# Patient Record
Sex: Female | Born: 1960 | Race: White | Hispanic: No | Marital: Married | Smoking: Never smoker
Health system: Southern US, Community
[De-identification: ages and names within clinical notes are randomized; demographics above are authoritative.]

## PROBLEM LIST (undated history)

## (undated) DIAGNOSIS — J40 Bronchitis, not specified as acute or chronic: Secondary | ICD-10-CM

## (undated) DIAGNOSIS — I10 Essential (primary) hypertension: Secondary | ICD-10-CM

## (undated) DIAGNOSIS — J45909 Unspecified asthma, uncomplicated: Secondary | ICD-10-CM

## (undated) DIAGNOSIS — F419 Anxiety disorder, unspecified: Secondary | ICD-10-CM

## (undated) DIAGNOSIS — J189 Pneumonia, unspecified organism: Secondary | ICD-10-CM

## (undated) DIAGNOSIS — E785 Hyperlipidemia, unspecified: Secondary | ICD-10-CM

## (undated) HISTORY — PX: BREAST EXCISIONAL BIOPSY: SUR124

## (undated) HISTORY — DX: Bronchitis, not specified as acute or chronic: J40

## (undated) HISTORY — PX: HYSTEROTOMY: SHX1776

## (undated) HISTORY — DX: Unspecified asthma, uncomplicated: J45.909

## (undated) HISTORY — PX: BREAST BIOPSY: SHX20

## (undated) HISTORY — PX: ABDOMINAL HYSTERECTOMY: SHX81

## (undated) HISTORY — PX: BREAST SURGERY: SHX581

---

## 2006-11-09 ENCOUNTER — Encounter: Admission: RE | Admit: 2006-11-09 | Discharge: 2006-11-09 | Payer: Self-pay | Admitting: Internal Medicine

## 2006-11-14 ENCOUNTER — Encounter: Admission: RE | Admit: 2006-11-14 | Discharge: 2006-11-14 | Payer: Self-pay | Admitting: Internal Medicine

## 2006-11-21 ENCOUNTER — Encounter: Admission: RE | Admit: 2006-11-21 | Discharge: 2006-11-21 | Payer: Self-pay | Admitting: Internal Medicine

## 2006-11-22 ENCOUNTER — Encounter: Admission: RE | Admit: 2006-11-22 | Discharge: 2006-11-22 | Payer: Self-pay | Admitting: Internal Medicine

## 2008-10-30 HISTORY — PX: ABDOMINAL HYSTERECTOMY: SHX81

## 2009-09-02 ENCOUNTER — Encounter: Admission: RE | Admit: 2009-09-02 | Discharge: 2009-09-02 | Payer: Self-pay | Admitting: Obstetrics & Gynecology

## 2009-11-23 ENCOUNTER — Ambulatory Visit (HOSPITAL_COMMUNITY): Admission: RE | Admit: 2009-11-23 | Discharge: 2009-11-24 | Payer: Self-pay | Admitting: Obstetrics & Gynecology

## 2009-11-23 ENCOUNTER — Encounter (INDEPENDENT_AMBULATORY_CARE_PROVIDER_SITE_OTHER): Payer: Self-pay | Admitting: Obstetrics & Gynecology

## 2010-11-20 ENCOUNTER — Encounter: Payer: Self-pay | Admitting: Obstetrics & Gynecology

## 2011-01-16 LAB — DIFFERENTIAL
Basophils Absolute: 0.1 10*3/uL (ref 0.0–0.1)
Eosinophils Absolute: 0.2 10*3/uL (ref 0.0–0.7)
Eosinophils Relative: 2 % (ref 0–5)
Monocytes Absolute: 0.5 10*3/uL (ref 0.1–1.0)
Monocytes Relative: 6 % (ref 3–12)
Neutrophils Relative %: 61 % (ref 43–77)

## 2011-01-16 LAB — URINALYSIS, ROUTINE W REFLEX MICROSCOPIC
Bilirubin Urine: NEGATIVE
Hgb urine dipstick: NEGATIVE
Ketones, ur: NEGATIVE mg/dL
Nitrite: NEGATIVE

## 2011-01-16 LAB — COMPREHENSIVE METABOLIC PANEL
Albumin: 4.1 g/dL (ref 3.5–5.2)
Alkaline Phosphatase: 94 U/L (ref 39–117)
BUN: 10 mg/dL (ref 6–23)
CO2: 29 mEq/L (ref 19–32)
Calcium: 9.2 mg/dL (ref 8.4–10.5)
Chloride: 102 mEq/L (ref 96–112)
Glucose, Bld: 99 mg/dL (ref 70–99)
Total Bilirubin: 0.9 mg/dL (ref 0.3–1.2)
Total Protein: 7.3 g/dL (ref 6.0–8.3)

## 2011-01-16 LAB — CBC
HCT: 34.2 % — ABNORMAL LOW (ref 36.0–46.0)
Hemoglobin: 14.4 g/dL (ref 12.0–15.0)
MCHC: 33.7 g/dL (ref 30.0–36.0)
MCV: 90.3 fL (ref 78.0–100.0)
Platelets: 218 10*3/uL (ref 150–400)
RBC: 3.77 MIL/uL — ABNORMAL LOW (ref 3.87–5.11)
RDW: 14.6 % (ref 11.5–15.5)

## 2011-01-24 ENCOUNTER — Other Ambulatory Visit: Payer: Self-pay | Admitting: Obstetrics & Gynecology

## 2011-01-24 DIAGNOSIS — Z0000 Encounter for general adult medical examination without abnormal findings: Secondary | ICD-10-CM

## 2011-01-24 DIAGNOSIS — Z1231 Encounter for screening mammogram for malignant neoplasm of breast: Secondary | ICD-10-CM

## 2011-01-25 ENCOUNTER — Ambulatory Visit: Payer: Self-pay

## 2011-01-25 ENCOUNTER — Other Ambulatory Visit: Payer: Self-pay | Admitting: Obstetrics & Gynecology

## 2011-01-25 ENCOUNTER — Ambulatory Visit
Admission: RE | Admit: 2011-01-25 | Discharge: 2011-01-25 | Disposition: A | Discharge status: Home / Self Care | Payer: BC Managed Care – PPO | Source: Ambulatory Visit | Attending: Obstetrics & Gynecology | Admitting: Obstetrics & Gynecology

## 2011-01-25 DIAGNOSIS — Z1239 Encounter for other screening for malignant neoplasm of breast: Secondary | ICD-10-CM

## 2012-03-20 ENCOUNTER — Other Ambulatory Visit: Payer: Self-pay | Admitting: Obstetrics & Gynecology

## 2012-03-20 DIAGNOSIS — Z1231 Encounter for screening mammogram for malignant neoplasm of breast: Secondary | ICD-10-CM

## 2012-04-10 ENCOUNTER — Ambulatory Visit: Payer: BC Managed Care – PPO

## 2012-04-29 ENCOUNTER — Ambulatory Visit
Admission: RE | Admit: 2012-04-29 | Discharge: 2012-04-29 | Disposition: A | Discharge status: Home / Self Care | Payer: BC Managed Care – PPO | Source: Ambulatory Visit | Attending: Obstetrics & Gynecology | Admitting: Obstetrics & Gynecology

## 2012-04-29 DIAGNOSIS — Z1231 Encounter for screening mammogram for malignant neoplasm of breast: Secondary | ICD-10-CM

## 2015-06-23 DIAGNOSIS — M5432 Sciatica, left side: Secondary | ICD-10-CM | POA: Insufficient documentation

## 2015-06-23 DIAGNOSIS — H109 Unspecified conjunctivitis: Secondary | ICD-10-CM | POA: Insufficient documentation

## 2015-09-06 ENCOUNTER — Other Ambulatory Visit: Payer: Self-pay

## 2015-09-06 DIAGNOSIS — Z1231 Encounter for screening mammogram for malignant neoplasm of breast: Secondary | ICD-10-CM

## 2015-10-07 ENCOUNTER — Ambulatory Visit: Payer: Self-pay

## 2015-10-07 ENCOUNTER — Other Ambulatory Visit: Payer: Self-pay

## 2015-10-29 DIAGNOSIS — R519 Headache, unspecified: Secondary | ICD-10-CM | POA: Insufficient documentation

## 2017-10-22 ENCOUNTER — Telehealth: Payer: Self-pay | Admitting: Pulmonary Disease

## 2017-10-22 NOTE — Telephone Encounter (Signed)
I cannot determine how soon she should be seen as there is no other information on the chart or imaging regarding her history and the lung nodules. Can you check if anyone else can see her earlier in clinic?

## 2017-10-22 NOTE — Telephone Encounter (Signed)
Pt has been scheduled for consult with PM on 11/08/16, as this is first available. Pt is requesting a sooner apt. Pt has been made aware that 11/08/16 is first available.  PM please advise if pt should be seen sooner, or if current apt is okay. Thanks.

## 2017-10-24 NOTE — Telephone Encounter (Signed)
Called pt letting her know that the appt she has already scheduled with us is the earliest we could get her in for an appt due to her being a new consult and requiring a 30min slot. Pt expressed understanding. Nothing further needed.

## 2017-11-08 ENCOUNTER — Encounter: Payer: Self-pay | Admitting: Pulmonary Disease

## 2017-11-08 ENCOUNTER — Ambulatory Visit: Payer: BLUE CROSS/BLUE SHIELD | Admitting: Pulmonary Disease

## 2017-11-08 VITALS — BP 132/78 | HR 93 | Ht 65.0 in | Wt 227.6 lb

## 2017-11-08 DIAGNOSIS — R918 Other nonspecific abnormal finding of lung field: Secondary | ICD-10-CM

## 2017-11-08 DIAGNOSIS — J45909 Unspecified asthma, uncomplicated: Secondary | ICD-10-CM

## 2017-11-08 LAB — NITRIC OXIDE: Nitric Oxide: 27

## 2017-11-08 MED ORDER — ALBUTEROL SULFATE (2.5 MG/3ML) 0.083% IN NEBU: 2.5000 mg | INHALATION_SOLUTION | Freq: Four times a day (QID) | RESPIRATORY_TRACT | 12 refills | Status: DC | PRN

## 2017-11-08 MED ORDER — ALBUTEROL SULFATE HFA 108 (90 BASE) MCG/ACT IN AERS: 2.0000 | INHALATION_SPRAY | Freq: Four times a day (QID) | RESPIRATORY_TRACT | 3 refills | Status: DC | PRN

## 2017-11-08 NOTE — Patient Instructions (Addendum)
I have reviewed your CT scan report.  You have 2 small nodules which look benign and not likely to be a malignancy We will follow-up with a repeat CT without contrast in 1 year It would be useful if you can give us the previous CT images on a disc for us to review. Follow-up in 1 year after repeat scan We will call in a prescription for albuterol.

## 2017-11-08 NOTE — Progress Notes (Signed)
Mary Mendez    [id number]    08/03/1961  Primary Care Physician:Millsaps, Cala BradfordKimberly, NP  Referring Physician: Lindaann PascalLong, Scott, PA-C [address] [place], [place] [id number]  Chief complaint:  Consult for evaluation of lung nodules  HPI:  57 year old with history of mild intermittent asthma.  She had incidental findings of lung nodules on a CT chest done to evaluate swelling over the clavicle.  She has been referred here for further evaluation She has complaints of cough, sinus congestion from viral infection.  Denies any fevers, chills, sputum production, hemoptysis, loss of weight, loss of appetite.  She has history of mild intermittent asthma and is on albuterol respiclick.  She hardly needs to use the rescue inhaler.  She feels that the powder formulation does not work as well and is requesting switching to a regular inhaler.  Pets: None Occupation: Self-employed as a Programme researcher, broadcasting/film/videohypnotist Exposures: No known exposures Smoking history: Smoker, no secondhand smoke Travel History: Grew up in [place].  Moved to [place] in 1994.  Outpatient Encounter Medications as of 11/08/2017  Medication Sig  . PROAIR RESPICLICK 108 (90 Base) MCG/ACT AEPB INHALE 2 PUFFS INTO THE LUNGS Q 4 H PRN FOR 10 DAYS  . tretinoin (RETIN-A) 0.05 % cream APPLY ON THE SKIN NIGHTLY   No facility-administered encounter medications on file as of 11/08/2017.     Allergies as of 11/08/2017  . (No Known Allergies)    Past Medical History:  Diagnosis Date  . Asthma   . Bronchitis     Past Surgical History:  Procedure Laterality Date  . BREAST BIOPSY    . HYSTEROTOMY      Family History  Problem Relation Age of Onset  . Breast cancer Mother   . Emphysema Father   . Asthma Paternal Uncle     Social History   Socioeconomic History  . Marital status: Married    Spouse name: Not on file  . Number of children: Not on file  . Years of education: Not on file  . Highest education level: Not  on file  Social Needs  . Financial resource strain: Not on file  . Food insecurity - worry: Not on file  . Food insecurity - inability: Not on file  . Transportation needs - medical: Not on file  . Transportation needs - non-medical: Not on file  Occupational History  . Not on file  Tobacco Use  . Smoking status: Never Smoker  . Smokeless tobacco: Never Used  Substance and Sexual Activity  . Alcohol use: Yes    Comment: occ  . Drug use: No  . Sexual activity: Not on file  Other Topics Concern  . Not on file  Social History Narrative  . Not on file   Review of systems: Review of Systems  Constitutional: Negative for fever and chills.  HENT: Negative.   Eyes: Negative for blurred vision.  Respiratory: as per HPI  Cardiovascular: Negative for chest pain and palpitations.  Gastrointestinal: Negative for vomiting, diarrhea, blood per rectum. Genitourinary: Negative for dysuria, urgency, frequency and hematuria.  Musculoskeletal: Negative for myalgias, back pain and joint pain.  Skin: Negative for itching and rash.  Neurological: Negative for dizziness, tremors, focal weakness, seizures and loss of consciousness.  Endo/Heme/Allergies: Negative for environmental allergies.  Psychiatric/Behavioral: Negative for depression, suicidal ideas and hallucinations.  All other systems reviewed and are negative.  Physical Exam: Blood pressure 132/78, pulse 93, height 5\' 5"  (1.651 m), weight 227  lb 9.6 oz (103.2 kg), SpO2 98 %. Gen:      No acute distress HEENT:  EOMI, sclera anicteric Neck:     No masses; no thyromegaly Lungs:    Clear to auscultation bilaterally; normal respiratory effort CV:         Regular rate and rhythm; no murmurs Abd:      + bowel sounds; soft, non-tender; no palpable masses, no distension Ext:    No edema; adequate peripheral perfusion Skin:      Warm and dry; no rash Neuro: alert and oriented x 3 Psych: normal mood and affect  Data Reviewed: CT chest  10/17/14 2 mm left upper lobe granuloma, 2 mm right upper lobe granuloma.  No other abnormality.  FENO 11/08/17-27  Assessment:  Incidental finding of lung nodules I do not have the images to review but by report they look like granulomas, likely benign.  She is a non-smoker with no risk factors. This could be sequelae of prior history of histo infection as she grew up in [place]. Suspicion for malignancy is low.   We will try to get actual images of her CT to review.  I have reassured the patient and recommended follow-up CT in 1 year   Mild intermittent asthma She is currently on albuterol respite click powdered formulation and does not like it.  She is requesting a prescription for regular albuterol.  Plan/Recommendations: - CT chest without contrast in 1 year.  Obtain images of prior chest imaging - Prescribe albuterol inhaler.  Chilton Greathouse MD [facility] Pager [phone] 11/08/2017, 11:54 AM  CC: Lindaann Pascal, PA-C

## 2017-11-13 ENCOUNTER — Other Ambulatory Visit: Payer: Self-pay | Admitting: *Deleted

## 2017-11-13 ENCOUNTER — Other Ambulatory Visit: Payer: Self-pay | Admitting: Obstetrics & Gynecology

## 2017-11-13 DIAGNOSIS — Z139 Encounter for screening, unspecified: Secondary | ICD-10-CM

## 2017-12-04 ENCOUNTER — Ambulatory Visit: Payer: BLUE CROSS/BLUE SHIELD

## 2017-12-20 ENCOUNTER — Ambulatory Visit
Admission: RE | Admit: 2017-12-20 | Discharge: 2017-12-20 | Disposition: A | Discharge status: Home / Self Care | Payer: BLUE CROSS/BLUE SHIELD | Source: Ambulatory Visit | Attending: *Deleted | Admitting: *Deleted

## 2017-12-20 DIAGNOSIS — Z139 Encounter for screening, unspecified: Secondary | ICD-10-CM

## 2018-10-15 ENCOUNTER — Other Ambulatory Visit: Payer: Self-pay | Admitting: Pulmonary Disease

## 2018-10-15 ENCOUNTER — Ambulatory Visit
Admission: RE | Admit: 2018-10-15 | Discharge: 2018-10-15 | Disposition: A | Discharge status: Home / Self Care | Payer: Self-pay | Source: Ambulatory Visit | Attending: Pulmonary Disease | Admitting: Pulmonary Disease

## 2018-10-15 DIAGNOSIS — R918 Other nonspecific abnormal finding of lung field: Secondary | ICD-10-CM

## 2018-11-18 ENCOUNTER — Inpatient Hospital Stay: Admission: RE | Admit: 2018-11-18 | Payer: BLUE CROSS/BLUE SHIELD | Source: Ambulatory Visit

## 2018-11-21 ENCOUNTER — Ambulatory Visit: Payer: BLUE CROSS/BLUE SHIELD | Admitting: Pulmonary Disease

## 2018-12-17 ENCOUNTER — Inpatient Hospital Stay: Admission: RE | Admit: 2018-12-17 | Payer: BLUE CROSS/BLUE SHIELD | Source: Ambulatory Visit

## 2018-12-19 ENCOUNTER — Ambulatory Visit: Payer: BLUE CROSS/BLUE SHIELD | Admitting: Pulmonary Disease

## 2018-12-25 ENCOUNTER — Ambulatory Visit (INDEPENDENT_AMBULATORY_CARE_PROVIDER_SITE_OTHER)
Admission: RE | Admit: 2018-12-25 | Discharge: 2018-12-25 | Disposition: A | Discharge status: Home / Self Care | Payer: BLUE CROSS/BLUE SHIELD | Source: Ambulatory Visit | Attending: Pulmonary Disease | Admitting: Pulmonary Disease

## 2018-12-25 DIAGNOSIS — R918 Other nonspecific abnormal finding of lung field: Secondary | ICD-10-CM

## 2018-12-26 ENCOUNTER — Ambulatory Visit: Payer: BLUE CROSS/BLUE SHIELD | Admitting: Pulmonary Disease

## 2018-12-26 ENCOUNTER — Encounter: Payer: Self-pay | Admitting: Pulmonary Disease

## 2018-12-26 VITALS — BP 140/80 | HR 100 | Ht 65.0 in | Wt 231.2 lb

## 2018-12-26 DIAGNOSIS — R918 Other nonspecific abnormal finding of lung field: Secondary | ICD-10-CM | POA: Diagnosis not present

## 2018-12-26 DIAGNOSIS — J45909 Unspecified asthma, uncomplicated: Secondary | ICD-10-CM | POA: Diagnosis not present

## 2018-12-26 MED ORDER — ALBUTEROL SULFATE HFA 108 (90 BASE) MCG/ACT IN AERS: 2.0000 | INHALATION_SPRAY | Freq: Four times a day (QID) | RESPIRATORY_TRACT | 3 refills | Status: DC | PRN

## 2018-12-26 NOTE — Patient Instructions (Signed)
His CT scan shows that the left nodule is improved.  The right nodule is the same size which is good news This is likely benign lung nodules We will get a follow-up CT without contrast in 1 and half years time Return to clinic after CT scan.

## 2018-12-26 NOTE — Progress Notes (Signed)
         Mary Mendez    [id number]    12-24-1960  Primary Care Physician:Millsaps, Cala Bradford, NP  Referring Physician: Marva Panda, NP [address] [place], [place] [address]  Chief complaint:  Follow up for lung nodules, asthma  HPI:  58 year old with history of mild intermittent asthma.  She had incidental findings of lung nodules on a CT chest done to evaluate swelling over the clavicle.  She has been referred here for further evaluation She has complaints of cough, sinus congestion from viral infection.  Denies any fevers, chills, sputum production, hemoptysis, loss of weight, loss of appetite.  She has history of mild intermittent asthma and is on albuterol respiclick.  She hardly needs to use the rescue inhaler.  She feels that the powder formulation does not work as well and is requesting switching to a regular inhaler.  Pets: None Occupation: Self-employed as a Programme researcher, broadcasting/film/video Exposures: No known exposures Smoking history: Smoker, no secondhand smoke Travel History: Grew up in [place].  Moved to [place] in 1994.  Interim history: No complaints.  Dyspnea stable.  She hardly needs to use her albuterol inhaler She is here for review of CT scan.  Outpatient Encounter Medications as of 12/26/2018  Medication Sig  . albuterol (PROAIR HFA) 108 (90 Base) MCG/ACT inhaler Inhale 2 puffs into the lungs every 6 (six) hours as needed for wheezing or shortness of breath.  Marland Kitchen albuterol (PROVENTIL) (2.5 MG/3ML) 0.083% nebulizer solution Take 3 mLs (2.5 mg total) by nebulization every 6 (six) hours as needed for wheezing or shortness of breath.  . tretinoin (RETIN-A) 0.05 % cream APPLY ON THE SKIN NIGHTLY   No facility-administered encounter medications on file as of 12/26/2018.    Physical Exam: Blood pressure 140/80, pulse 100, height 5\' 5"  (1.651 m), weight 231 lb 3.2 oz (104.9 kg), SpO2 97 %. Gen:      No acute distress HEENT:  EOMI, sclera anicteric Neck:     No  masses; no thyromegaly Lungs:    Clear to auscultation bilaterally; normal respiratory effort CV:         Regular rate and rhythm; no murmurs Abd:      + bowel sounds; soft, non-tender; no palpable masses, no distension Ext:    No edema; adequate peripheral perfusion Skin:      Warm and dry; no rash Neuro: alert and oriented x 3 Psych: normal mood and affect  Data Reviewed: CT chest 10/17/17 2 mm left upper lobe granuloma,4 mm right upper lobe granuloma.  No other abnormality.  CT chest 12/25/2018 Stable 4 mm nodule in the right upper lobe.  Left upper lobe nodule is not obvious on this exam. I have reviewed the images personally.  FENO 11/08/17-27  Assessment:  Incidental finding of lung nodules Stable on follow-up CT and are probably benign.  She is a non-smoker with no risk factors. This could be sequelae of prior history of histo infection as she grew up in [place]. Suspicion for malignancy is low.   Follow-up CT in 1 to 1.5 years.  If stable then we can stop imaging.  Mild intermittent asthma Albuterol PRN.  Plan/Recommendations: - CT chest without contrast follow-up.  Chilton Greathouse MD [facility] 12/26/2018, 4:08 PM  CC: Marva Panda, NP

## 2019-08-07 DIAGNOSIS — R0781 Pleurodynia: Secondary | ICD-10-CM | POA: Insufficient documentation

## 2019-08-15 ENCOUNTER — Other Ambulatory Visit: Payer: Self-pay | Admitting: *Deleted

## 2019-08-15 DIAGNOSIS — Z1231 Encounter for screening mammogram for malignant neoplasm of breast: Secondary | ICD-10-CM

## 2019-10-03 ENCOUNTER — Ambulatory Visit: Payer: BLUE CROSS/BLUE SHIELD

## 2019-11-08 ENCOUNTER — Emergency Department (HOSPITAL_COMMUNITY)
Admission: EM | Admit: 2019-11-08 | Discharge: 2019-11-08 | Disposition: A | Discharge status: Home / Self Care | Payer: BC Managed Care – PPO | Attending: Emergency Medicine | Admitting: Emergency Medicine

## 2019-11-08 DIAGNOSIS — J45909 Unspecified asthma, uncomplicated: Secondary | ICD-10-CM | POA: Insufficient documentation

## 2019-11-08 DIAGNOSIS — R040 Epistaxis: Secondary | ICD-10-CM | POA: Insufficient documentation

## 2019-11-08 MED ORDER — OXYMETAZOLINE HCL 0.05 % NA SOLN
1.0000 | Freq: Once | NASAL | Status: AC
  Administered 2019-11-08: 1 via NASAL
  Filled 2019-11-08: qty 30

## 2019-11-08 NOTE — ED Triage Notes (Signed)
Transported by [facility] from [facility]-- started experiencing a nose bleed at 6 am this morning; hx of such. Went to doctor's office this am and they administered 2 doses of Afrin; bleeding controlled upon arrival but patient sent to ED for further evaluation due to elevated blood pressure. BP with EMS read 178/110--no prior history of HTN. Admits that she has been taking Goody powders daily for "months."

## 2019-11-08 NOTE — Discharge Instructions (Addendum)
If bleeding continues-  Blow your nose to remove any clots Spray Afrin Hold pressure- pinch the soft part of the nose closed- for 15 consecutive minutes DONT blow your nose after this. If bleeding continues- return to the ER. If the above provides temporary relief but you continue to have off and on bleeding, follow up with ENT.  Monitor your blood pressure at home, follow up with your doctor. If you develop severe nose bleed, headache, visual changes, shortness or breath, chest pain or other concerns- return to the ER.

## 2019-11-08 NOTE — ED Notes (Signed)
ED Provider at bedside. 

## 2019-11-08 NOTE — ED Provider Notes (Signed)
[facility]-EMERGENCY DEPT Provider Note   CSN: [id number] Arrival date & time: 11/08/19  1104     History Chief Complaint  Patient presents with  . Epistaxis  . Hypertension    Mary Mendez is a 59 y.o. female.  59yo female with past medical history of asthma and bronchitis. Patient reports waking just after 6am, used a tissue to scratch inside her nose and nose began to bleed. Patient applied a cool rag to her face and leaned forward over the sink. Bleeding continued so patient went to [facility] where they sprayed Afrin in her nose. Patient reports ongoing bleeding with elevated BP and was sent to the ER. No history of HTN, states her BP is usually elevated when she goes to the doctor but returns to the 120s systolic on recheck. Patient has been taking Good Powder with ASA in it recently, possibly contributing to her bleeding today. No other blood thinners. Denies CP, headache, dizziness, visual disturbance. No other complaints or concerns.         Past Medical History:  Diagnosis Date  . Asthma   . Bronchitis     There are no problems to display for this patient.   Past Surgical History:  Procedure Laterality Date  . BREAST BIOPSY    . BREAST EXCISIONAL BIOPSY Left   . HYSTEROTOMY       OB History   No obstetric history on file.     Family History  Problem Relation Age of Onset  . Breast cancer Mother   . Emphysema Father   . Asthma Paternal Uncle     Social History   Tobacco Use  . Smoking status: Never Smoker  . Smokeless tobacco: Never Used  Substance Use Topics  . Alcohol use: Yes    Comment: occ  . Drug use: No    Home Medications Prior to Admission medications   Medication Sig Start Date End Date Taking? Authorizing Provider  albuterol (PROAIR HFA) 108 (90 Base) MCG/ACT inhaler Inhale 2 puffs into the lungs every 6 (six) hours as needed for wheezing or shortness of breath. 12/26/18   Mannam, Colbert Coyer, MD    albuterol (PROVENTIL) (2.5 MG/3ML) 0.083% nebulizer solution Take 3 mLs (2.5 mg total) by nebulization every 6 (six) hours as needed for wheezing or shortness of breath. 11/08/17   Mannam, Colbert Coyer, MD  tretinoin (RETIN-A) 0.05 % cream APPLY ON THE SKIN NIGHTLY 08/29/17   [provider]    Allergies    Patient has no known allergies.  Review of Systems   Review of Systems  Constitutional: Negative for fever.  HENT: Positive for nosebleeds. Negative for congestion and ear pain.   Eyes: Negative for visual disturbance.  Respiratory: Negative for shortness of breath.   Cardiovascular: Negative for chest pain.  Skin: Negative for wound.  Allergic/Immunologic: Negative for immunocompromised state.  Neurological: Negative for headaches.  Hematological: Does not bruise/bleed easily.  All other systems reviewed and are negative.   Physical Exam Updated Vital Signs BP (!) 167/94   Pulse (!) 104   Temp 98.2 F (36.8 C) (Oral)   Resp 15   SpO2 99%   Physical Exam Vitals and nursing note reviewed.  Constitutional:      General: She is not in acute distress.    Appearance: She is well-developed. She is not diaphoretic.  HENT:     Head: Normocephalic and atraumatic.     Nose:     Right Nostril: No epistaxis  or septal hematoma.     Left Nostril: Epistaxis present. No septal hematoma.     Comments: Trace amount of oozing blood in left nares with visible clot Pulmonary:     Effort: Pulmonary effort is normal.  Skin:    General: Skin is warm and dry.     Findings: No erythema or rash.  Neurological:     Mental Status: She is alert and oriented to person, place, and time.  Psychiatric:        Behavior: Behavior normal.     ED Results / Procedures / Treatments   Labs (all labs ordered are listed, but only abnormal results are displayed) Labs Reviewed - No data to display  EKG None  Radiology No results found.  Procedures Procedures (including critical care  time)  Medications Ordered in ED Medications  oxymetazoline (AFRIN) 0.05 % nasal spray 1 spray (1 spray Each Nare Given 11/08/19 1247)    ED Course  I have reviewed the triage vital signs and the nursing notes.  Pertinent labs & imaging results that were available during my care of the patient were reviewed by me and considered in my medical decision making (see chart for details).  Clinical Course as of Nov 07 1322  Sat Nov 08, 2019  1315 59yo female brought in by EMS from [facility] for left side nose bleed. On arrival, bleeding had slowed/stopped, given afrin at [facility]. On exam, oozing noted to left nare with large clot present. Patient blew nose, large clot removed, small area of light bleeding noted to anterior nose consistent with patient having rubbed her nose this morning prior to onset of bleeding. Patient had already used bedside Afrin prior to this, no further bleeding. Discussed process if bleeding should return, if controlled, can follow up with ENT, if bleeding persists, return to ER. BP gradually improving, patient has BP cuff at home, will monitor her BP, states her BP is normally elevated at the dr office.    [LM]    Clinical Course User Index [LM] Roque Lias   MDM Rules/Calculators/A&P                      Final Clinical Impression(s) / ED Diagnoses Final diagnoses:  Left-sided epistaxis    Rx / DC Orders ED Discharge Orders    None       Roque Lias 11/08/19 1324    Lacretia Leigh, MD 11/09/19 1435

## 2019-12-22 ENCOUNTER — Encounter (HOSPITAL_COMMUNITY): Payer: Self-pay | Admitting: Emergency Medicine

## 2019-12-22 ENCOUNTER — Other Ambulatory Visit: Payer: Self-pay

## 2019-12-22 ENCOUNTER — Emergency Department (HOSPITAL_COMMUNITY)
Admission: EM | Admit: 2019-12-22 | Discharge: 2019-12-22 | Disposition: A | Discharge status: Home / Self Care | Payer: BC Managed Care – PPO | Attending: Emergency Medicine | Admitting: Emergency Medicine

## 2019-12-22 DIAGNOSIS — J45909 Unspecified asthma, uncomplicated: Secondary | ICD-10-CM | POA: Insufficient documentation

## 2019-12-22 DIAGNOSIS — R519 Headache, unspecified: Secondary | ICD-10-CM

## 2019-12-22 DIAGNOSIS — F419 Anxiety disorder, unspecified: Secondary | ICD-10-CM | POA: Diagnosis not present

## 2019-12-22 DIAGNOSIS — Z79899 Other long term (current) drug therapy: Secondary | ICD-10-CM | POA: Diagnosis not present

## 2019-12-22 LAB — BASIC METABOLIC PANEL
Anion gap: 13 (ref 5–15)
BUN: 16 mg/dL (ref 6–20)
CO2: 22 mmol/L (ref 22–32)
Calcium: 9.1 mg/dL (ref 8.9–10.3)
Chloride: 105 mmol/L (ref 98–111)
Creatinine, Ser: 0.96 mg/dL (ref 0.44–1.00)
GFR calc Af Amer: >60 mL/min (ref >60)
GFR calc non Af Amer: >60 mL/min (ref >60)
Glucose, Bld: 113 mg/dL — ABNORMAL HIGH (ref 70–99)
Potassium: 4.3 mmol/L (ref 3.5–5.1)
Sodium: 140 mmol/L (ref 135–145)

## 2019-12-22 LAB — CBC
HCT: 48.1 % — ABNORMAL HIGH (ref 36.0–46.0)
Hemoglobin: 15.5 g/dL — ABNORMAL HIGH (ref 12.0–15.0)
MCH: 29.5 pg (ref 26.0–34.0)
MCHC: 32.2 g/dL (ref 30.0–36.0)
MCV: 91.4 fL (ref 80.0–100.0)
Platelets: 269 10*3/uL (ref 150–400)
RBC: 5.26 MIL/uL — ABNORMAL HIGH (ref 3.87–5.11)
RDW: 15 % (ref 11.5–15.5)
WBC: 9.8 10*3/uL (ref 4.0–10.5)
nRBC: 0 % (ref 0.0–0.2)

## 2019-12-22 MED ORDER — KETOROLAC TROMETHAMINE 15 MG/ML IJ SOLN
15.0000 mg | Freq: Once | INTRAMUSCULAR | Status: AC
  Administered 2019-12-22: 15 mg via INTRAVENOUS
  Filled 2019-12-22: qty 1

## 2019-12-22 MED ORDER — PROCHLORPERAZINE EDISYLATE 10 MG/2ML IJ SOLN
10.0000 mg | Freq: Once | INTRAMUSCULAR | Status: AC
  Administered 2019-12-22: 10 mg via INTRAVENOUS
  Filled 2019-12-22: qty 2

## 2019-12-22 MED ORDER — DIPHENHYDRAMINE HCL 50 MG/ML IJ SOLN
25.0000 mg | Freq: Once | INTRAMUSCULAR | Status: AC
  Administered 2019-12-22: 25 mg via INTRAVENOUS
  Filled 2019-12-22: qty 1

## 2019-12-22 MED ORDER — HYDROXYZINE HCL 25 MG PO TABS: 25.0000 mg | ORAL_TABLET | Freq: Four times a day (QID) | ORAL | 0 refills | Status: AC

## 2019-12-22 NOTE — Discharge Instructions (Addendum)
I have prescribed medication to help with your anxiety, please take 1 tablet every 6 hours for your symptoms.   You will need to schedule an appointment with PCP for your anxiety, in order to obtain further management.

## 2019-12-22 NOTE — ED Provider Notes (Signed)
Medical Decision Making: Care of patient assumed from [facility] PA-C at 1500.  Agree with history, physical exam and plan.  See their note for further details.  Briefly, 59 y.o. female with PMH/PSH as below.  Past Medical History:  Diagnosis Date  . Asthma   . Bronchitis    Past Surgical History:  Procedure Laterality Date  . BREAST BIOPSY    . BREAST EXCISIONAL BIOPSY Left   . HYSTEROTOMY       Patient HDS at handoff.    Plan at time of handoff:   Right sided headache with some facial tingling.  Plan to treat with headache cocktail and reassess, ok for discharge home if symptoms improved.   Will follow up with PCP.    ED Course Rechecked the patient whose symptoms have completely resolved.  Patient will follow up with her primary care provider, strict return precautions provided and patient was discharged in stable condition.  Clinical Impression 1. Bad headache   2. Anxiety    Patient care provided under supervision of my attending, Dr. Jacqulyn Bath.    Danika Kluender, [place], MD 12/22/19 1641    Maia Plan, MD 12/23/19 [phone]

## 2019-12-22 NOTE — ED Provider Notes (Signed)
[facility] EMERGENCY DEPARTMENT Provider Note   CSN: [id number] Arrival date & time: 12/22/19  1149     History Chief Complaint  Patient presents with  . Headache  . Numbness    Mary Mendez is a 59 y.o. female.  59 y.o female with a PMH of Asthma, Anxiety ( no longer on medication) presents to the ED with a chief complaint of right sided headache with radiation to her face. She describes this as tingling sensation to her right scalp.   The history is provided by the patient.  Headache Pain location:  R parietal Quality:  Sharp Radiates to:  Face Severity currently:  0/10 Severity at highest:  5/10 Onset quality:  Gradual Duration:  3 days Timing:  Intermittent Progression:  Unchanged Chronicity:  Recurrent Similar to prior headaches: no   Context: emotional stress   Context: not exposure to cold air   Relieved by:  Nothing Worsened by:  Nothing Ineffective treatments:  Acetaminophen and aspirin Associated symptoms: paresthesias   Associated symptoms: no blurred vision, no cough, no ear pain, no fever, no nausea, no neck pain, no photophobia and no syncope        Past Medical History:  Diagnosis Date  . Asthma   . Bronchitis     There are no problems to display for this patient.   Past Surgical History:  Procedure Laterality Date  . BREAST BIOPSY    . BREAST EXCISIONAL BIOPSY Left   . HYSTEROTOMY       OB History   No obstetric history on file.     Family History  Problem Relation Age of Onset  . Breast cancer Mother   . Emphysema Father   . Asthma Paternal Uncle     Social History   Tobacco Use  . Smoking status: Never Smoker  . Smokeless tobacco: Never Used  Substance Use Topics  . Alcohol use: Yes    Comment: occ  . Drug use: No    Home Medications Prior to Admission medications   Medication Sig Start Date End Date Taking? Authorizing Provider  albuterol (PROAIR HFA) 108 (90 Base) MCG/ACT inhaler Inhale 2  puffs into the lungs every 6 (six) hours as needed for wheezing or shortness of breath. 12/26/18  Yes Mannam, Praveen, MD  albuterol (PROVENTIL) (2.5 MG/3ML) 0.083% nebulizer solution Take 3 mLs (2.5 mg total) by nebulization every 6 (six) hours as needed for wheezing or shortness of breath. 11/08/17  Yes Mannam, Praveen, MD  ibuprofen (ADVIL) 200 MG tablet Take 400 mg by mouth every 6 (six) hours as needed for headache (pain).   Yes [provider]  Omega-3 Fatty Acids (FISH OIL PO) Take 1 capsule by mouth daily.   Yes [provider]  tretinoin (RETIN-A) 0.05 % cream Apply 1 application topically at bedtime. Apply to face 08/29/17  Yes [provider]  hydrOXYzine (ATARAX/VISTARIL) 25 MG tablet Take 1 tablet (25 mg total) by mouth every 6 (six) hours for 7 days. 12/22/19 12/29/19  Claude Manges, PA-C    Allergies    Patient has no known allergies.  Review of Systems   Review of Systems  Constitutional: Negative for fever.  HENT: Negative for ear pain.   Eyes: Negative for blurred vision and photophobia.  Respiratory: Negative for cough.   Cardiovascular: Negative for chest pain and syncope.  Gastrointestinal: Negative for nausea.  Genitourinary: Negative for flank pain.  Musculoskeletal: Negative for neck pain.  Skin: Negative for pallor and  wound.  Neurological: Positive for headaches and paresthesias.    Physical Exam Updated Vital Signs BP 124/77   Pulse 85   Temp 98.3 F (36.8 C) (Oral)   Resp 20   Wt 105 kg   SpO2 99%   BMI 38.52 kg/m   Physical Exam Vitals and nursing note reviewed.  Constitutional:      General: She is not in acute distress.    Appearance: She is well-developed.  HENT:     Head: Normocephalic and atraumatic.      Comments: No vesicles, rashes present, no noted irritation.     Mouth/Throat:     Mouth: Mucous membranes are moist.     Pharynx: No oropharyngeal exudate.  Eyes:     Pupils: Pupils are equal, round, and  reactive to light.     Comments: Pupils are equal and reactive.   Cardiovascular:     Rate and Rhythm: Regular rhythm.     Heart sounds: Normal heart sounds.  Pulmonary:     Effort: Pulmonary effort is normal. No respiratory distress.     Breath sounds: Normal breath sounds.  Abdominal:     General: Bowel sounds are normal. There is no distension.     Palpations: Abdomen is soft.     Tenderness: There is no abdominal tenderness.  Musculoskeletal:        General: No tenderness or deformity.     Cervical back: Normal range of motion.     Right lower leg: No edema.     Left lower leg: No edema.  Skin:    General: Skin is warm and dry.  Neurological:     Mental Status: She is alert and oriented to person, place, and time.     Comments: Alert, oriented, thought content appropriate. Speech fluent without evidence of aphasia. Able to follow 2 step commands without difficulty.  Cranial Nerves:  II:  Peripheral visual fields grossly normal, pupils, round, reactive to light III,IV, VI: ptosis not present, extra-ocular motions intact bilaterally  V,VII: smile symmetric, facial light touch sensation equal VIII: hearing grossly normal bilaterally  IX,X: midline uvula rise  XI: bilateral shoulder shrug equal and strong XII: midline tongue extension  Motor:  5/5 in upper and lower extremities bilaterally including strong and equal grip strength and dorsiflexion/plantar flexion Sensory: light touch normal in all extremities.  Cerebellar: normal finger-to-nose with bilateral upper extremities, pronator drift negative Gait: normal gait and balance     ED Results / Procedures / Treatments   Labs (all labs ordered are listed, but only abnormal results are displayed) Labs Reviewed  BASIC METABOLIC PANEL - Abnormal; Notable for the following components:      Result Value   Glucose, Bld 113 (*)    All other components within normal limits  CBC - Abnormal; Notable for the following components:     RBC 5.26 (*)    Hemoglobin 15.5 (*)    HCT 48.1 (*)    All other components within normal limits    EKG None  Radiology No results found.  Procedures Procedures (including critical care time)  Medications Ordered in ED Medications  diphenhydrAMINE (BENADRYL) injection 25 mg (25 mg Intravenous Given 12/22/19 1511)  prochlorperazine (COMPAZINE) injection 10 mg (10 mg Intravenous Given 12/22/19 1512)  ketorolac (TORADOL) 15 MG/ML injection 15 mg (15 mg Intravenous Given 12/22/19 1512)    ED Course  I have reviewed the triage vital signs and the nursing notes.  Pertinent labs & imaging results that  were available during my care of the patient were reviewed by me and considered in my medical decision making (see chart for details).    MDM Rules/Calculators/A&P     With a past medical history of migraines presents to the ED with complaints of right sided headache, reports this has been ongoing for the past 3 days.  The has been coming and going, somewhat unchanged.  She became worried as she felt tingling sensation to the right side of her scalp, reports a sensation of tingling to the right side of her head, scalp was visualized without any vesicles, rashes, without any presence of shingles.  No trouble with her speech, neck pain, vision changes.  No nausea or vomiting.  She has tried some aspirin, acetaminophen and reports resolution in her headache however does report she has been under a lot of stress and feels like this somewhat has caused her symptoms. She does report a similar episode in the past, does have a prior history of panic attack with similar symptoms.No neck rigidity, no fever, doubt meningitis. No sudden onset of headache, she is neuro intact, lower suspicion for intracranial pathology.   She is currently not on any medication for her anxiety, reports she was originally taking buspirone but reports this was discontinued due to unknown reason.  She has not followed up with  her PCP for her anxiety recently.  She reports her pain is no about a 3 will provider her with HA cocktail and reasessement.   Chart reviewed revealed prior visits 3 years ago to her PCPs office for headache along with anxiety.  This seems to be somewhat of a recurrent visit for patient.  3:35 PM Patient reevaluated by me, reports just receiving medications approximately 15 minutes ago. Will need reevaluation prior to disposition. I have provided her with a short prescription for atarax to help with her anxiety. She is aware she will need to follow up with PCP for further medication management.    Portions of this note were generated with [employer]. Dictation errors may occur despite best attempts at proofreading.  Final Clinical Impression(s) / ED Diagnoses Final diagnoses:  Bad headache    Rx / DC Orders ED Discharge Orders         Ordered    hydrOXYzine (ATARAX/VISTARIL) 25 MG tablet  Every 6 hours     12/22/19 1522           Janeece Fitting, PA-C 12/22/19 1541    Blanchie Dessert, MD 12/23/19 [phone]

## 2019-12-22 NOTE — ED Triage Notes (Signed)
Pt in from home w/HA x few days, worse today. Also reports bilateral head and mouth numbness since waking this am. States she woke very stressed, and similar has happened in past. Neuro intact bilaterally, no vision changes

## 2020-01-04 IMAGING — MG DIGITAL SCREENING BILATERAL MAMMOGRAM WITH TOMO AND CAD
6 of 10 series · 6 of 30 positions shown · non-contrast
Comparison: Previous exam(s).

CLINICAL DATA: Screening.

EXAM:
DIGITAL SCREENING BILATERAL MAMMOGRAM WITH TOMO AND CAD

[L MLO synth-2D (1 of 2)]
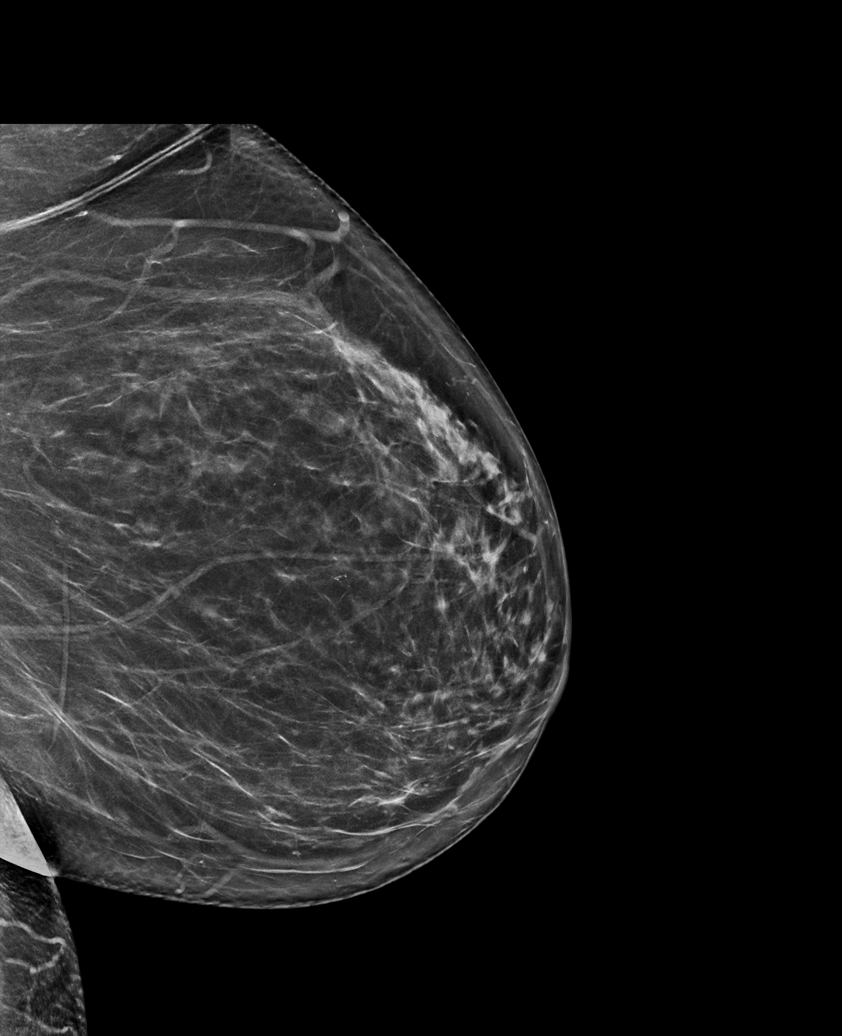

[L MLO synth-2D (2 of 2)]
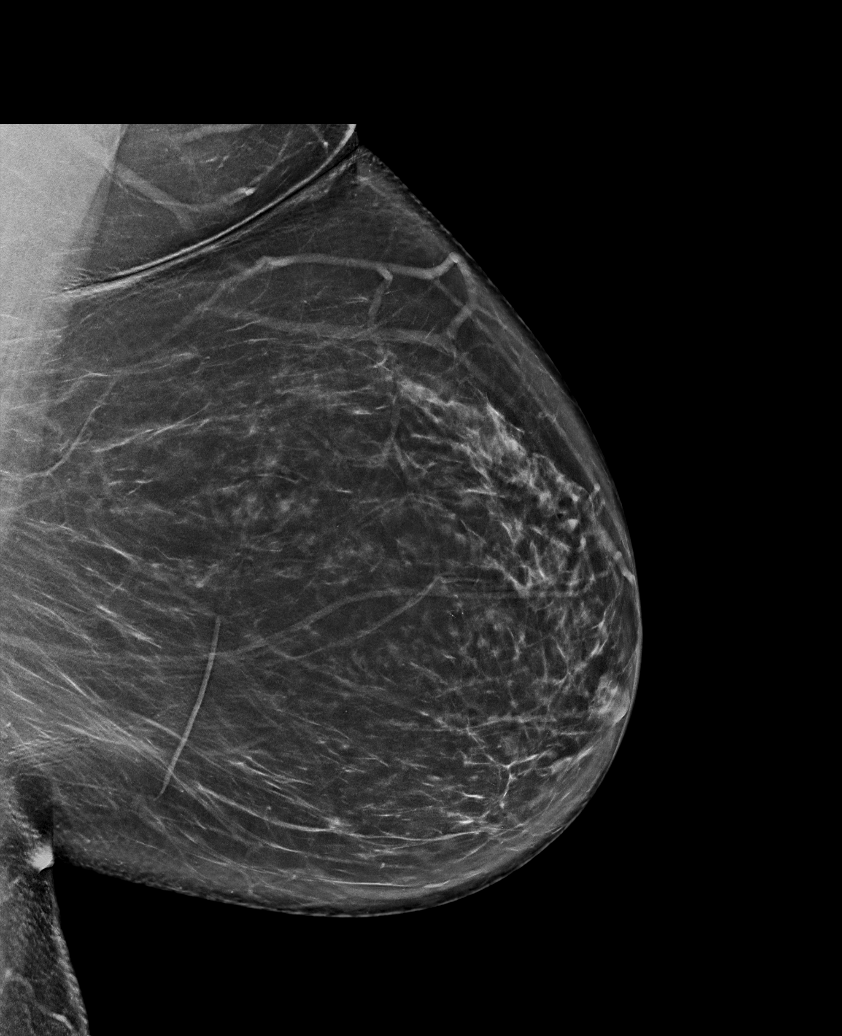

[L CC synth-2D]
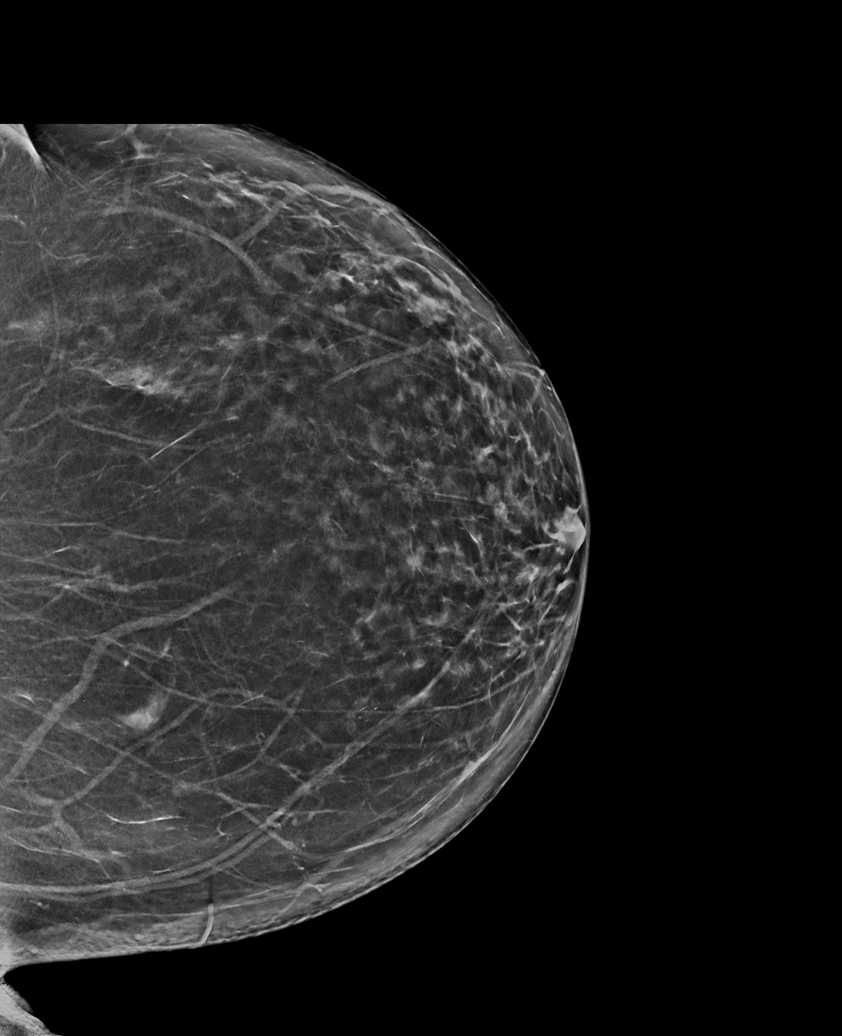

[R MLO synth-2D]
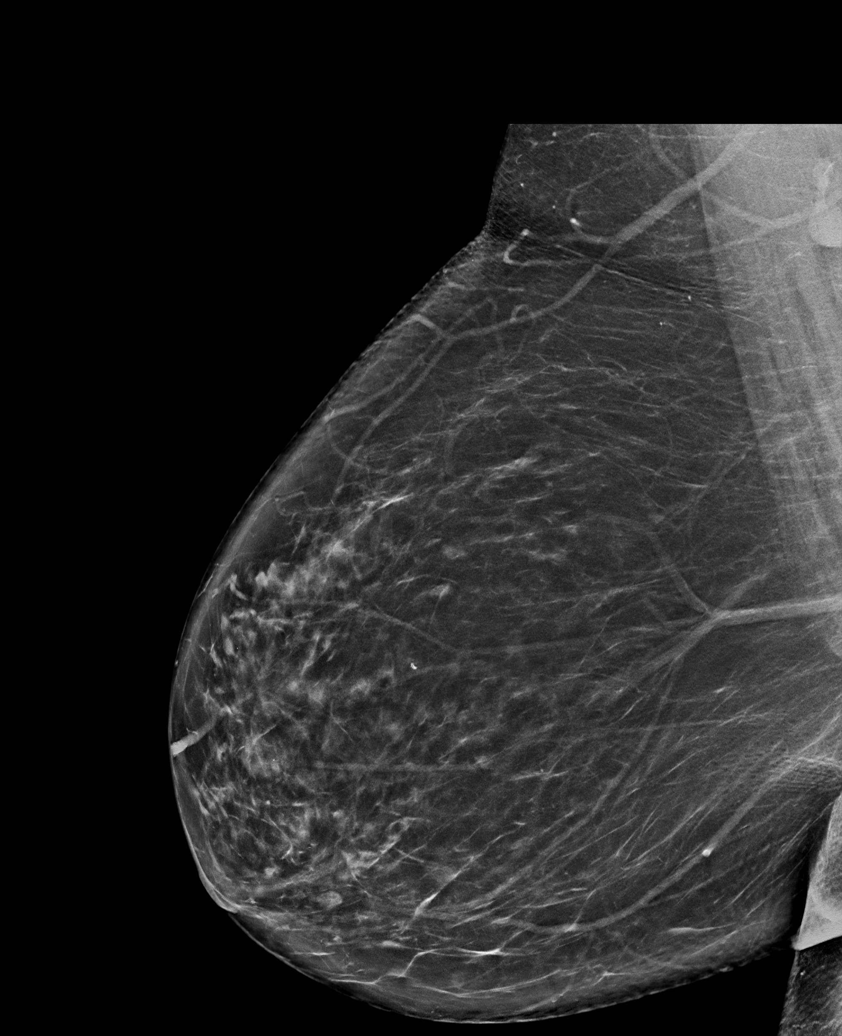

[R CC synth-2D]
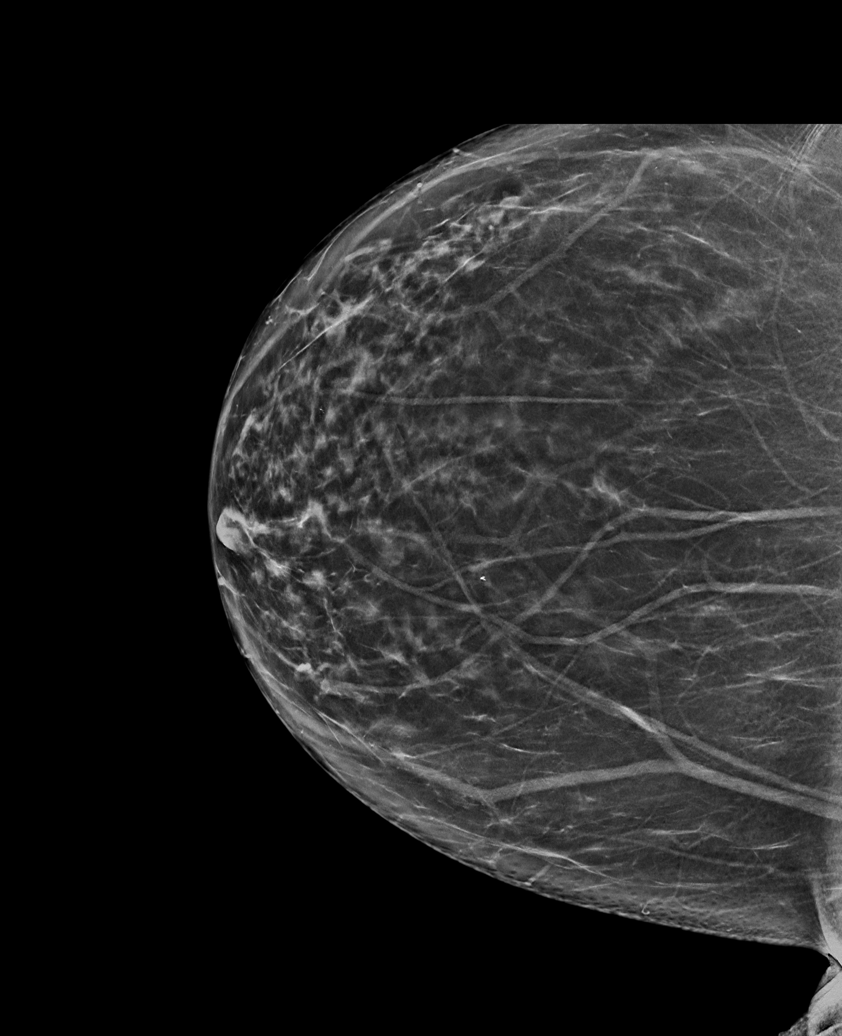

[R CC tomo · tomo slice 41/80.0]
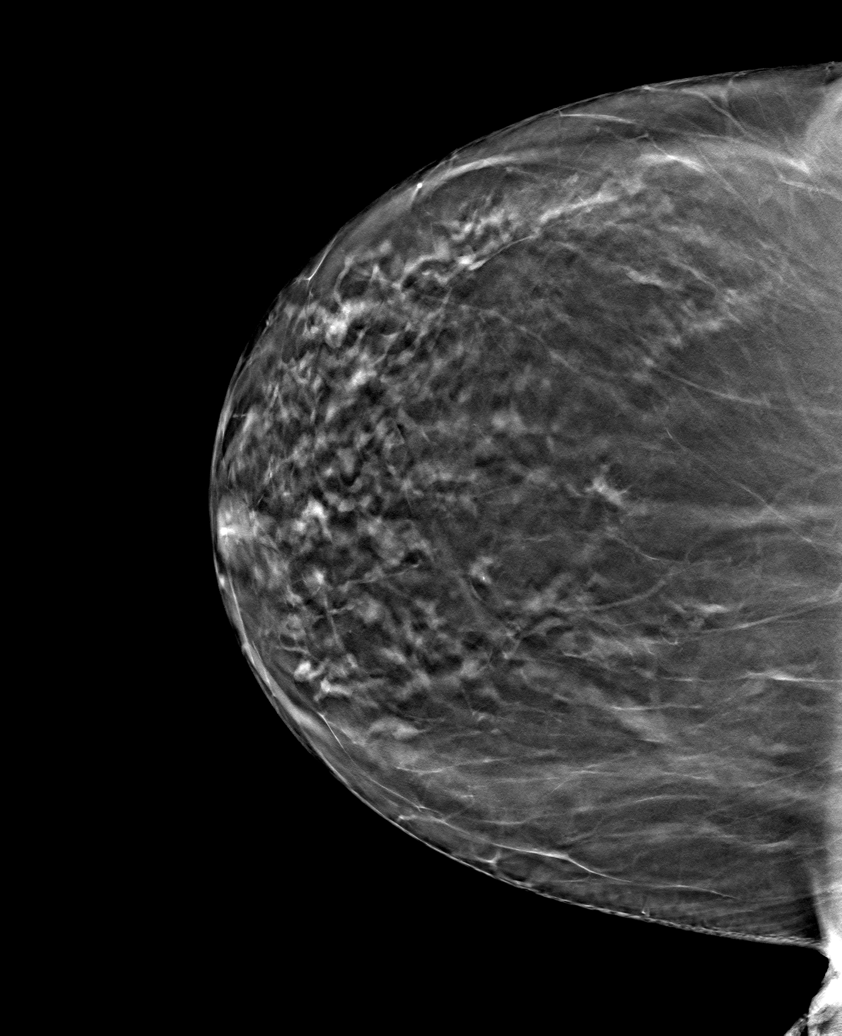

[6 of 30 positions shown; findings below may reference images not displayed]

ACR Breast Density Category b: There are scattered areas of
fibroglandular density.
FINDINGS: There are no findings suspicious for malignancy. Images were
processed with CAD.
IMPRESSION: No mammographic evidence of malignancy. A result letter of this
screening mammogram will be mailed directly to the patient.

RECOMMENDATION:
Screening mammogram in one year. (Code:[id number])

BI-RADS CATEGORY  1: Negative.

## 2020-01-16 ENCOUNTER — Telehealth: Payer: Self-pay | Admitting: Pulmonary Disease

## 2020-01-16 MED ORDER — ALBUTEROL SULFATE HFA 108 (90 BASE) MCG/ACT IN AERS: 2.0000 | INHALATION_SPRAY | Freq: Four times a day (QID) | RESPIRATORY_TRACT | 5 refills | Status: DC | PRN

## 2020-01-16 NOTE — Telephone Encounter (Signed)
Pt has not been seen since 11/2018, but per last office note pt was told to follow up in 18 mos, so she is still compliant with follow-up. Albuterol inhaler sent to pharmacy as requested.  Nothing further needed at this time- will close encounter.

## 2020-01-25 DIAGNOSIS — J069 Acute upper respiratory infection, unspecified: Secondary | ICD-10-CM | POA: Insufficient documentation

## 2020-06-22 ENCOUNTER — Ambulatory Visit: Payer: Self-pay

## 2020-06-25 ENCOUNTER — Inpatient Hospital Stay: Admission: RE | Admit: 2020-06-25 | Payer: BC Managed Care – PPO | Source: Ambulatory Visit

## 2021-01-08 IMAGING — CT CT CHEST W/O CM
2 of 4 series · 15 of 36 positions shown, 18 images · non-contrast
Comparison: 10/17/2017 from [facility]

CLINICAL DATA: Follow-up indeterminate pulmonary nodule.

EXAM:
CT CHEST WITHOUT CONTRAST
TECHNIQUE: Multidetector CT imaging of the chest was performed following the
standard protocol without IV contrast.

[Series 2: thorax · axial · 0.74mm/px · z∈[-215,+29]mm · 12 of 146 slices shown, 15 images]
[im 12/146  mediastinal]
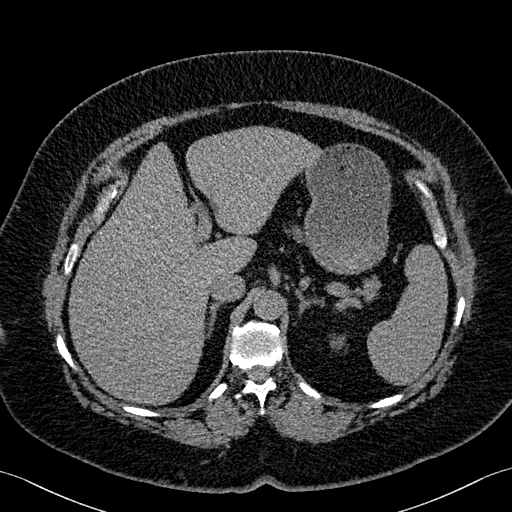
[im 12/146  lung]
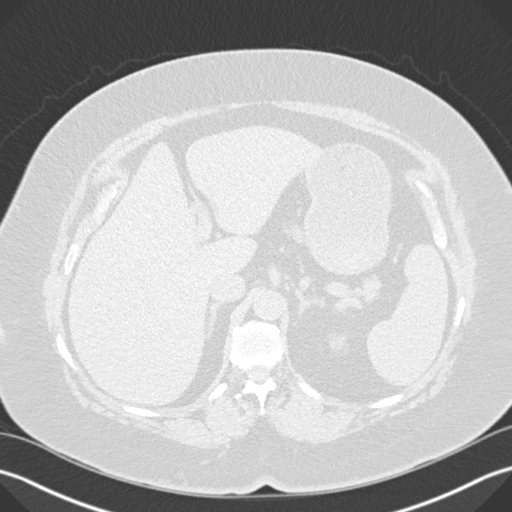
[im 23/146  lung]
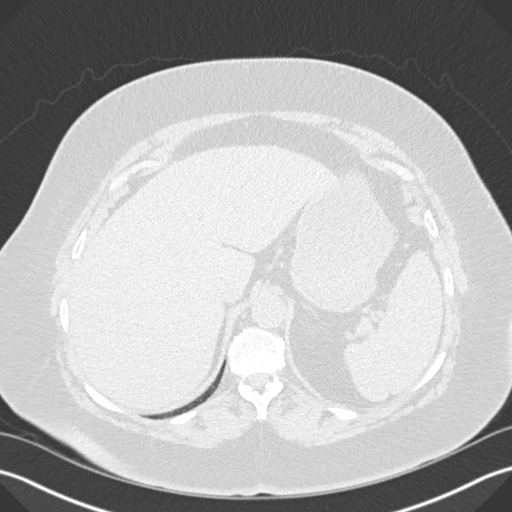
[im 34/146  lung]
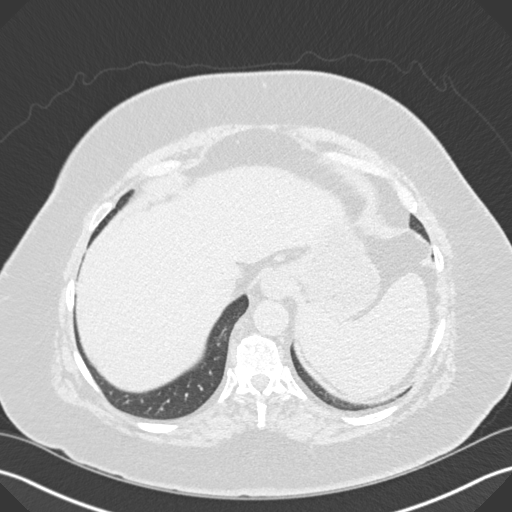
[im 45/146  lung]
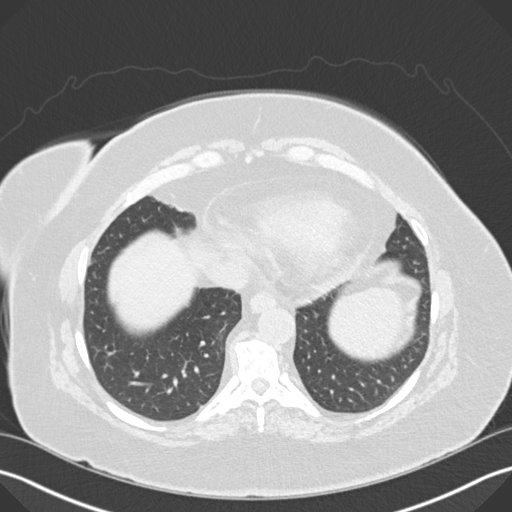
[im 56/146  mediastinal]
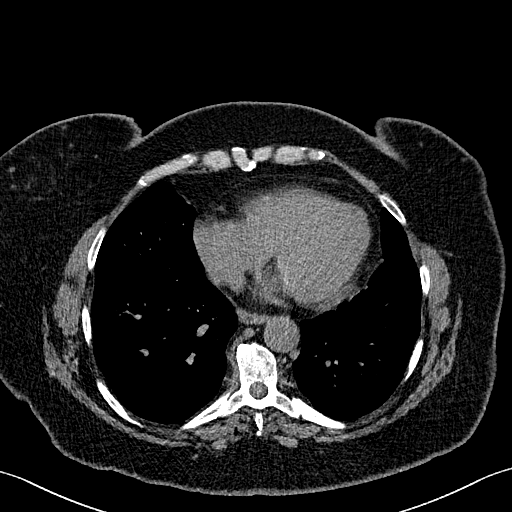
[im 56/146  lung]
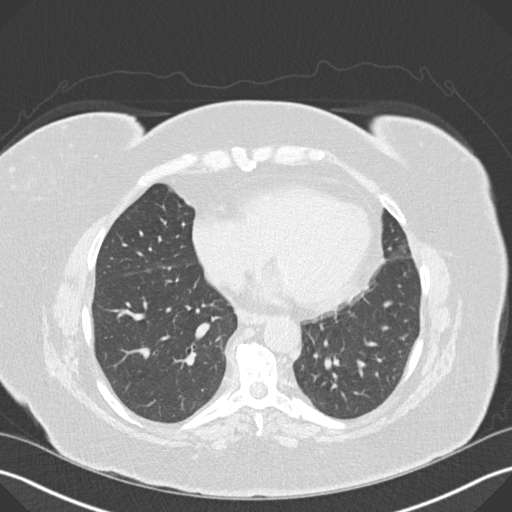
[im 67/146  lung]
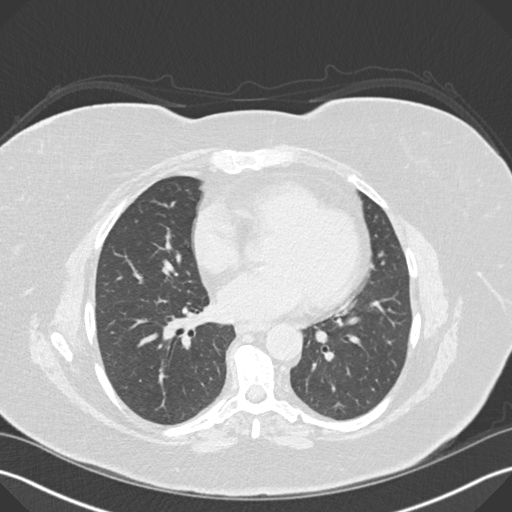
[im 79/146  lung]
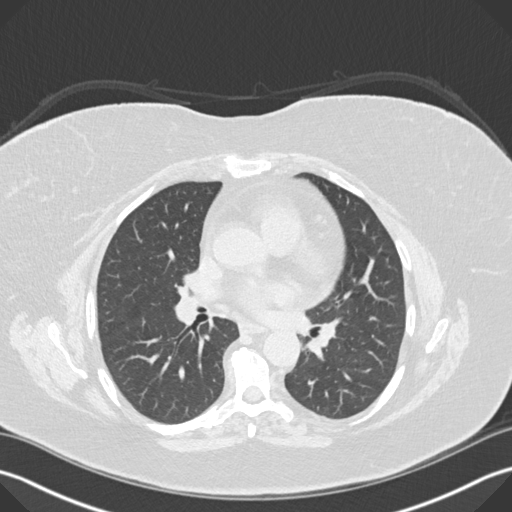
[im 90/146  lung]
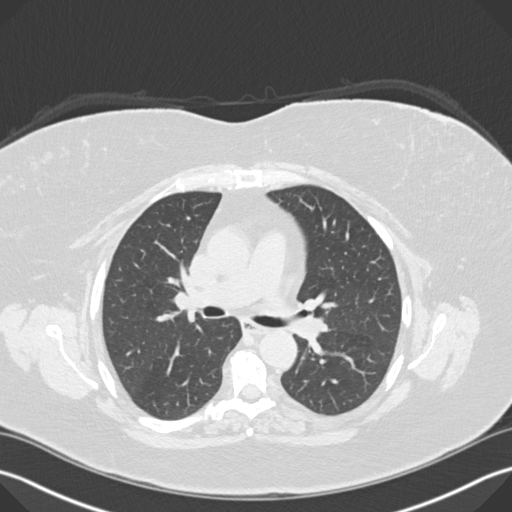
[im 101/146  mediastinal]
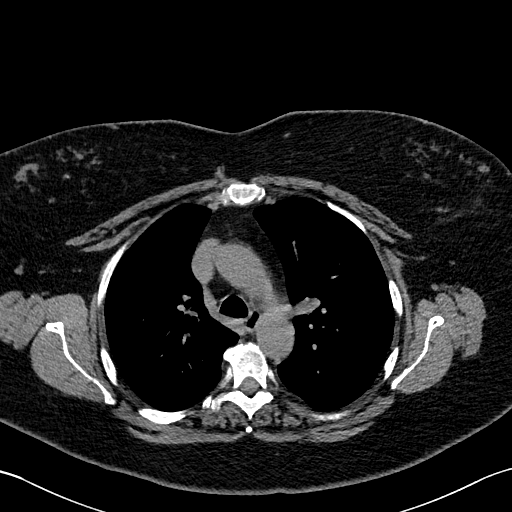
[im 101/146  lung]
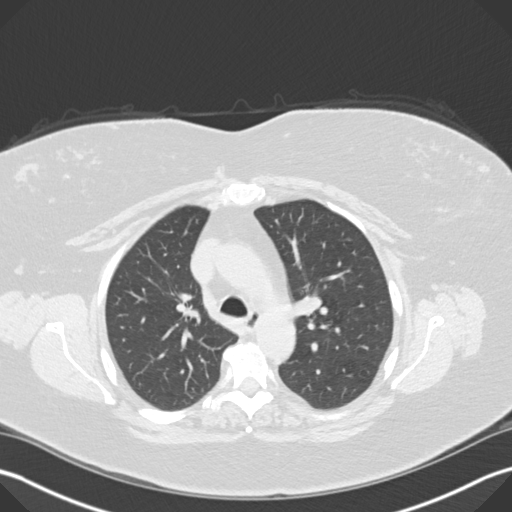
[im 112/146  lung]
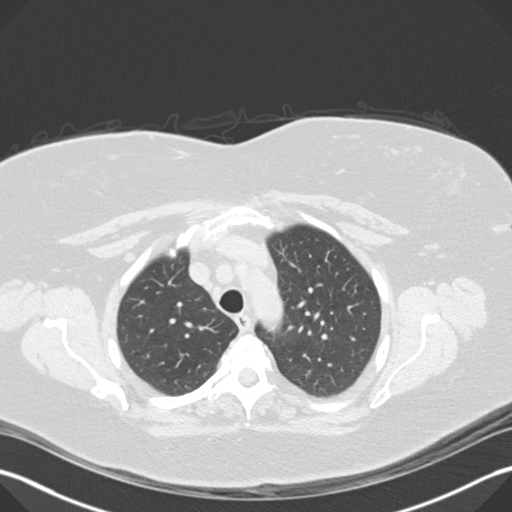
[im 123/146  lung]
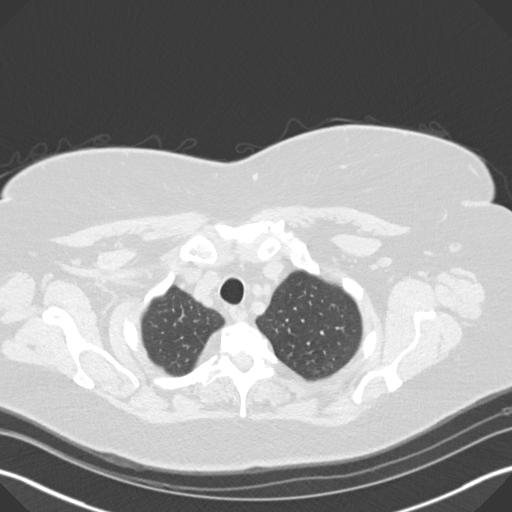
[im 134/146  lung]
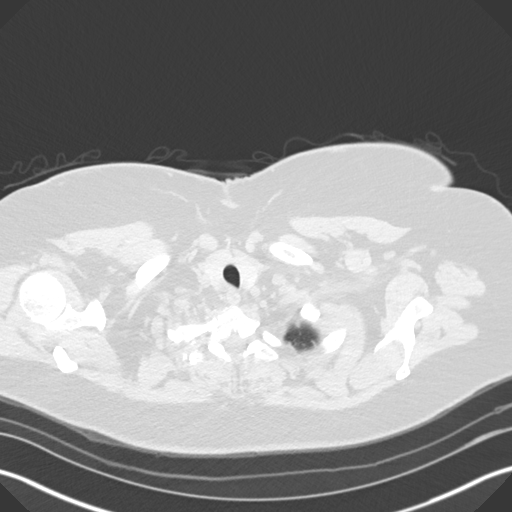

[Series 5: coronal · coronal · 0.59mm/px · 3 of 131 slices shown]
[im 27/131  lung]
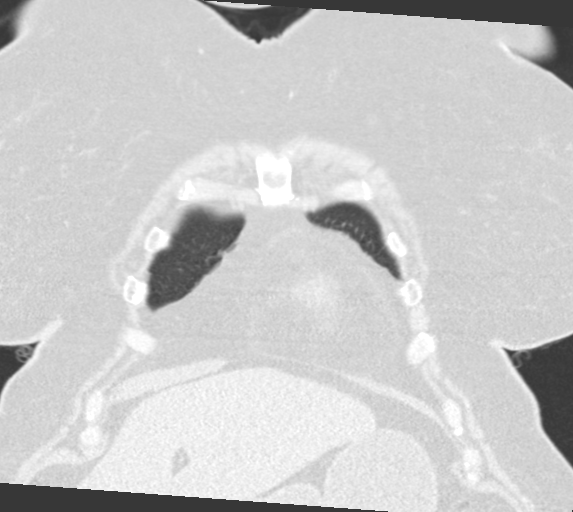
[im 53/131  lung]
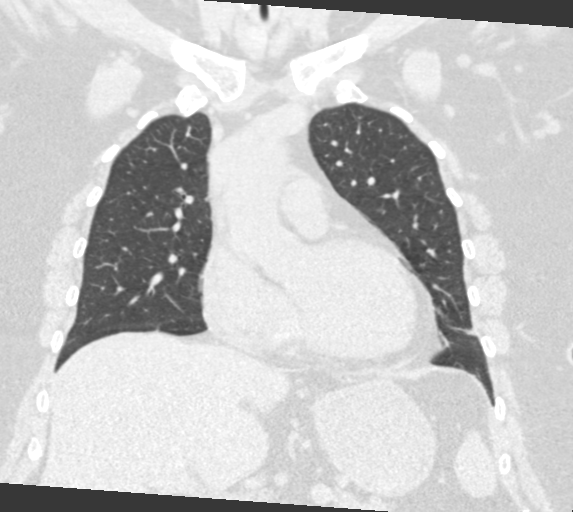
[im 79/131  lung]
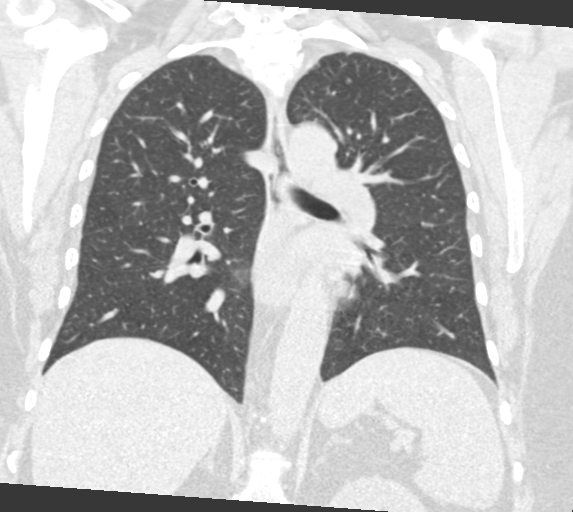

[15 of 36 positions shown; findings below may reference images not displayed]

FINDINGS: Cardiovascular:  No acute findings.

Mediastinum/Nodes: No masses or pathologically enlarged lymph nodes
identified on this unenhanced exam.

Lungs/Pleura: 4 mm pulmonary nodule in the posterior right upper
lobe is stable, consistent with benign etiology. No new or enlarging
pulmonary nodules or masses identified. No evidence of pulmonary
infiltrate or pleural effusion.

Upper Abdomen:  Unremarkable.

Musculoskeletal:  No suspicious bone lesions.
IMPRESSION: No active disease. Stable 4 mm right upper lobe pulmonary nodule,
consistent with benign etiology.

## 2021-08-30 DIAGNOSIS — U071 COVID-19: Secondary | ICD-10-CM

## 2021-08-30 HISTORY — DX: COVID-19: U07.1

## 2021-09-23 DIAGNOSIS — U071 COVID-19: Secondary | ICD-10-CM | POA: Insufficient documentation

## 2021-10-27 DIAGNOSIS — J019 Acute sinusitis, unspecified: Secondary | ICD-10-CM | POA: Diagnosis not present

## 2021-12-17 DIAGNOSIS — I10 Essential (primary) hypertension: Secondary | ICD-10-CM | POA: Diagnosis not present

## 2021-12-17 DIAGNOSIS — J45909 Unspecified asthma, uncomplicated: Secondary | ICD-10-CM | POA: Diagnosis not present

## 2022-02-15 DIAGNOSIS — J45909 Unspecified asthma, uncomplicated: Secondary | ICD-10-CM | POA: Diagnosis not present

## 2022-02-15 DIAGNOSIS — M545 Low back pain, unspecified: Secondary | ICD-10-CM | POA: Diagnosis not present

## 2022-02-15 DIAGNOSIS — F418 Other specified anxiety disorders: Secondary | ICD-10-CM | POA: Diagnosis not present

## 2022-02-17 ENCOUNTER — Ambulatory Visit (HOSPITAL_COMMUNITY)
Admission: EM | Admit: 2022-02-17 | Discharge: 2022-02-18 | Disposition: A | Discharge status: Home / Self Care | Payer: BC Managed Care – PPO | Attending: Emergency Medicine | Admitting: Emergency Medicine

## 2022-02-17 ENCOUNTER — Other Ambulatory Visit: Payer: Self-pay

## 2022-02-17 ENCOUNTER — Emergency Department (HOSPITAL_COMMUNITY): Payer: BC Managed Care – PPO

## 2022-02-17 ENCOUNTER — Encounter (HOSPITAL_COMMUNITY): Payer: Self-pay | Admitting: *Deleted

## 2022-02-17 DIAGNOSIS — Z6836 Body mass index (BMI) 36.0-36.9, adult: Secondary | ICD-10-CM | POA: Insufficient documentation

## 2022-02-17 DIAGNOSIS — K828 Other specified diseases of gallbladder: Secondary | ICD-10-CM | POA: Diagnosis not present

## 2022-02-17 DIAGNOSIS — R109 Unspecified abdominal pain: Secondary | ICD-10-CM | POA: Diagnosis not present

## 2022-02-17 DIAGNOSIS — R1011 Right upper quadrant pain: Secondary | ICD-10-CM | POA: Diagnosis not present

## 2022-02-17 DIAGNOSIS — K8012 Calculus of gallbladder with acute and chronic cholecystitis without obstruction: Secondary | ICD-10-CM | POA: Insufficient documentation

## 2022-02-17 DIAGNOSIS — K760 Fatty (change of) liver, not elsewhere classified: Secondary | ICD-10-CM | POA: Insufficient documentation

## 2022-02-17 DIAGNOSIS — E669 Obesity, unspecified: Secondary | ICD-10-CM | POA: Insufficient documentation

## 2022-02-17 DIAGNOSIS — I10 Essential (primary) hypertension: Secondary | ICD-10-CM | POA: Diagnosis not present

## 2022-02-17 DIAGNOSIS — K810 Acute cholecystitis: Secondary | ICD-10-CM | POA: Diagnosis not present

## 2022-02-17 DIAGNOSIS — J45909 Unspecified asthma, uncomplicated: Secondary | ICD-10-CM | POA: Diagnosis not present

## 2022-02-17 DIAGNOSIS — K812 Acute cholecystitis with chronic cholecystitis: Secondary | ICD-10-CM

## 2022-02-17 HISTORY — DX: Essential (primary) hypertension: I10

## 2022-02-17 HISTORY — DX: Unspecified asthma, uncomplicated: J45.909

## 2022-02-17 HISTORY — DX: Pneumonia, unspecified organism: J18.9

## 2022-02-17 LAB — CBC WITH DIFFERENTIAL/PLATELET
Abs Immature Granulocytes: 0.05 10*3/uL (ref 0.00–0.07)
Basophils Absolute: 0 10*3/uL (ref 0.0–0.1)
Basophils Relative: 0 %
Eosinophils Absolute: 0.3 10*3/uL (ref 0.0–0.5)
Eosinophils Relative: 3 %
HCT: 41.5 % (ref 36.0–46.0)
Hemoglobin: 13.9 g/dL (ref 12.0–15.0)
Immature Granulocytes: 1 %
Lymphocytes Relative: 30 %
Lymphs Abs: 2.5 10*3/uL (ref 0.7–4.0)
MCH: 30.3 pg (ref 26.0–34.0)
MCHC: 33.5 g/dL (ref 30.0–36.0)
MCV: 90.6 fL (ref 80.0–100.0)
Monocytes Absolute: 0.5 10*3/uL (ref 0.1–1.0)
Monocytes Relative: 6 %
Neutro Abs: 5 10*3/uL (ref 1.7–7.7)
Neutrophils Relative %: 60 %
Platelets: 248 10*3/uL (ref 150–400)
RBC: 4.58 MIL/uL (ref 3.87–5.11)
RDW: 13.7 % (ref 11.5–15.5)
WBC: 8.4 10*3/uL (ref 4.0–10.5)
nRBC: 0 % (ref 0.0–0.2)

## 2022-02-17 MED ORDER — OXYCODONE-ACETAMINOPHEN 5-325 MG PO TABS: 2.0000 | ORAL_TABLET | Freq: Once | ORAL | Status: DC

## 2022-02-17 NOTE — ED Provider Triage Note (Signed)
Emergency Medicine Provider Triage Evaluation Note ? ?Mary Mendez , a 61 y.o. female  was evaluated in triage.  Pt complains of RUQ pain.  States ongoing for a few months now, pain does seems worse after eating.  She ate nachos, chicken wings, and chips tonight and pain has been severe since.  Denies nausea/vomiting.  Saw PCP recently and was put on muscle relaxers for this thinking it was muscular. ? ?Review of Systems  ?Positive: RUQ pain ?Negative: fever ? ?Physical Exam  ?BP (!) 158/88   Pulse 87   Temp 97.9 ?F (36.6 ?C) (Oral)   Resp 18   SpO2 100%  ?Gen:   Awake, no distress   ?Resp:  Normal effort  ?MSK:   Moves extremities without difficulty  ?Other:  RUQ tenderness, voluntary guarding ? ?Medical Decision Making  ?Medically screening exam initiated at 11:12 PM.  Appropriate orders placed.  Mary Mendez was informed that the remainder of the evaluation will be completed by another provider, this initial triage assessment does not replace that evaluation, and the importance of remaining in the ED until their evaluation is complete. ? ?RUQ pain.  Suspect cholecystitis/cholelithiasis.  Labs, RUQ [place].  Given percocet for pain. ?  ?Larene Pickett, PA-C ?02/17/22 2315 ? ?

## 2022-02-17 NOTE — ED Triage Notes (Signed)
The pt is c/o rt flank and chest pain for over a year  muscle relaxers are not helping lmp none ?

## 2022-02-18 ENCOUNTER — Emergency Department (HOSPITAL_COMMUNITY): Payer: BC Managed Care – PPO | Admitting: Certified Registered"

## 2022-02-18 ENCOUNTER — Encounter (HOSPITAL_COMMUNITY)
Admission: EM | Disposition: A | Discharge status: Home / Self Care | Payer: Self-pay | Source: Home / Self Care | Attending: Emergency Medicine

## 2022-02-18 ENCOUNTER — Emergency Department (HOSPITAL_COMMUNITY): Payer: BC Managed Care – PPO

## 2022-02-18 ENCOUNTER — Encounter (HOSPITAL_COMMUNITY): Payer: Self-pay | Admitting: Orthopedic Surgery

## 2022-02-18 DIAGNOSIS — K8012 Calculus of gallbladder with acute and chronic cholecystitis without obstruction: Secondary | ICD-10-CM | POA: Diagnosis not present

## 2022-02-18 DIAGNOSIS — K828 Other specified diseases of gallbladder: Secondary | ICD-10-CM | POA: Diagnosis not present

## 2022-02-18 DIAGNOSIS — K810 Acute cholecystitis: Secondary | ICD-10-CM | POA: Diagnosis not present

## 2022-02-18 DIAGNOSIS — K8010 Calculus of gallbladder with chronic cholecystitis without obstruction: Secondary | ICD-10-CM | POA: Diagnosis not present

## 2022-02-18 DIAGNOSIS — R109 Unspecified abdominal pain: Secondary | ICD-10-CM | POA: Diagnosis not present

## 2022-02-18 HISTORY — PX: CHOLECYSTECTOMY: SHX55

## 2022-02-18 LAB — COMPREHENSIVE METABOLIC PANEL
ALT: 48 U/L — ABNORMAL HIGH (ref 0–44)
AST: 47 U/L — ABNORMAL HIGH (ref 15–41)
Albumin: 3.7 g/dL (ref 3.5–5.0)
Alkaline Phosphatase: 75 U/L (ref 38–126)
Anion gap: 7 (ref 5–15)
BUN: 15 mg/dL (ref 8–23)
CO2: 25 mmol/L (ref 22–32)
Calcium: 9 mg/dL (ref 8.9–10.3)
Chloride: 109 mmol/L (ref 98–111)
Creatinine, Ser: 0.89 mg/dL (ref 0.44–1.00)
GFR, Estimated: >60 mL/min (ref >60)
Glucose, Bld: 130 mg/dL — ABNORMAL HIGH (ref 70–99)
Potassium: 4.2 mmol/L (ref 3.5–5.1)
Sodium: 141 mmol/L (ref 135–145)
Total Bilirubin: 0.7 mg/dL (ref 0.3–1.2)
Total Protein: 6.3 g/dL — ABNORMAL LOW (ref 6.5–8.1)

## 2022-02-18 LAB — LIPASE, BLOOD: Lipase: 39 U/L (ref 11–51)

## 2022-02-18 SURGERY — LAPAROSCOPIC CHOLECYSTECTOMY WITH INTRAOPERATIVE CHOLANGIOGRAM
Anesthesia: General | Site: Abdomen

## 2022-02-18 MED ORDER — SUGAMMADEX SODIUM 200 MG/2ML IV SOLN
INTRAVENOUS | Status: DC | PRN
  Administered 2022-02-18: 200 mg via INTRAVENOUS

## 2022-02-18 MED ORDER — FENTANYL CITRATE (PF) 100 MCG/2ML IJ SOLN: 25.0000 ug | INTRAMUSCULAR | Status: DC | PRN

## 2022-02-18 MED ORDER — 0.9 % SODIUM CHLORIDE (POUR BTL) OPTIME
TOPICAL | Status: DC | PRN
  Administered 2022-02-18: 1000 mL

## 2022-02-18 MED ORDER — MIDAZOLAM HCL 2 MG/2ML IJ SOLN
INTRAMUSCULAR | Status: DC | PRN
  Administered 2022-02-18: 2 mg via INTRAVENOUS

## 2022-02-18 MED ORDER — PHENYLEPHRINE 80 MCG/ML (10ML) SYRINGE FOR IV PUSH (FOR BLOOD PRESSURE SUPPORT)
PREFILLED_SYRINGE | INTRAVENOUS | Status: DC | PRN
  Administered 2022-02-18: 240 ug via INTRAVENOUS

## 2022-02-18 MED ORDER — ONDANSETRON HCL 4 MG/2ML IJ SOLN
INTRAMUSCULAR | Status: DC | PRN
  Administered 2022-02-18: 4 mg via INTRAVENOUS

## 2022-02-18 MED ORDER — BUPIVACAINE-EPINEPHRINE 0.25% -1:200000 IJ SOLN
INTRAMUSCULAR | Status: DC | PRN
  Administered 2022-02-18: 16 mL

## 2022-02-18 MED ORDER — ONDANSETRON HCL 4 MG/2ML IJ SOLN
INTRAMUSCULAR | Status: AC
  Filled 2022-02-18: qty 2

## 2022-02-18 MED ORDER — ACETAMINOPHEN 650 MG RE SUPP: 650.0000 mg | RECTAL | Status: DC | PRN

## 2022-02-18 MED ORDER — IPRATROPIUM-ALBUTEROL 0.5-2.5 (3) MG/3ML IN SOLN
RESPIRATORY_TRACT | Status: AC
  Filled 2022-02-18: qty 3

## 2022-02-18 MED ORDER — PROPOFOL 10 MG/ML IV BOLUS
INTRAVENOUS | Status: AC
  Filled 2022-02-18: qty 20

## 2022-02-18 MED ORDER — BUPIVACAINE-EPINEPHRINE (PF) 0.25% -1:200000 IJ SOLN
INTRAMUSCULAR | Status: AC
  Filled 2022-02-18: qty 30

## 2022-02-18 MED ORDER — AMISULPRIDE (ANTIEMETIC) 5 MG/2ML IV SOLN: 10.0000 mg | Freq: Once | INTRAVENOUS | Status: DC | PRN

## 2022-02-18 MED ORDER — OXYCODONE HCL 5 MG/5ML PO SOLN: 5.0000 mg | Freq: Once | ORAL | Status: DC | PRN

## 2022-02-18 MED ORDER — SODIUM CHLORIDE 0.9% FLUSH: 3.0000 mL | INTRAVENOUS | Status: DC | PRN

## 2022-02-18 MED ORDER — LACTATED RINGERS IV SOLN: INTRAVENOUS | Status: DC

## 2022-02-18 MED ORDER — DOCUSATE SODIUM 100 MG PO CAPS: 100.0000 mg | ORAL_CAPSULE | Freq: Two times a day (BID) | ORAL | 0 refills | Status: AC

## 2022-02-18 MED ORDER — PROPOFOL 10 MG/ML IV BOLUS
INTRAVENOUS | Status: DC | PRN
  Administered 2022-02-18: 200 mg via INTRAVENOUS

## 2022-02-18 MED ORDER — OXYCODONE HCL 5 MG PO TABS: 5.0000 mg | ORAL_TABLET | ORAL | Status: DC | PRN

## 2022-02-18 MED ORDER — ROCURONIUM BROMIDE 10 MG/ML (PF) SYRINGE
PREFILLED_SYRINGE | INTRAVENOUS | Status: AC
  Filled 2022-02-18: qty 10

## 2022-02-18 MED ORDER — HEMOSTATIC AGENTS (NO CHARGE) OPTIME
TOPICAL | Status: DC | PRN
  Administered 2022-02-18: 1 via TOPICAL

## 2022-02-18 MED ORDER — SCOPOLAMINE 1 MG/3DAYS TD PT72
1.0000 | MEDICATED_PATCH | TRANSDERMAL | Status: DC
  Administered 2022-02-18: 1.5 mg via TRANSDERMAL

## 2022-02-18 MED ORDER — DEXAMETHASONE SODIUM PHOSPHATE 10 MG/ML IJ SOLN
INTRAMUSCULAR | Status: AC
  Filled 2022-02-18: qty 1

## 2022-02-18 MED ORDER — FENTANYL CITRATE (PF) 100 MCG/2ML IJ SOLN
INTRAMUSCULAR | Status: DC | PRN
  Administered 2022-02-18: 50 ug via INTRAVENOUS
  Administered 2022-02-18 (×2): 100 ug via INTRAVENOUS

## 2022-02-18 MED ORDER — KETOROLAC TROMETHAMINE 30 MG/ML IJ SOLN: 30.0000 mg | Freq: Once | INTRAMUSCULAR | Status: DC

## 2022-02-18 MED ORDER — LIDOCAINE 2% (20 MG/ML) 5 ML SYRINGE
INTRAMUSCULAR | Status: DC | PRN
  Administered 2022-02-18: 60 mg via INTRAVENOUS

## 2022-02-18 MED ORDER — FENTANYL CITRATE (PF) 100 MCG/2ML IJ SOLN
INTRAMUSCULAR | Status: AC
  Filled 2022-02-18: qty 2

## 2022-02-18 MED ORDER — ACETAMINOPHEN 10 MG/ML IV SOLN
INTRAVENOUS | Status: AC
  Filled 2022-02-18: qty 100

## 2022-02-18 MED ORDER — PHENYLEPHRINE 80 MCG/ML (10ML) SYRINGE FOR IV PUSH (FOR BLOOD PRESSURE SUPPORT)
PREFILLED_SYRINGE | INTRAVENOUS | Status: AC
  Filled 2022-02-18: qty 10

## 2022-02-18 MED ORDER — SODIUM CHLORIDE 0.9% FLUSH: 3.0000 mL | Freq: Two times a day (BID) | INTRAVENOUS | Status: DC

## 2022-02-18 MED ORDER — MIDAZOLAM HCL 2 MG/2ML IJ SOLN
INTRAMUSCULAR | Status: AC
  Filled 2022-02-18: qty 2

## 2022-02-18 MED ORDER — ORAL CARE MOUTH RINSE: 15.0000 mL | Freq: Once | OROMUCOSAL | Status: AC

## 2022-02-18 MED ORDER — OXYCODONE HCL 5 MG PO TABS: 5.0000 mg | ORAL_TABLET | Freq: Once | ORAL | Status: DC | PRN

## 2022-02-18 MED ORDER — SODIUM CHLORIDE 0.9 % IV SOLN
2.0000 g | Freq: Once | INTRAVENOUS | Status: AC
  Administered 2022-02-18: 2 g via INTRAVENOUS
  Filled 2022-02-18: qty 20

## 2022-02-18 MED ORDER — SCOPOLAMINE 1 MG/3DAYS TD PT72
MEDICATED_PATCH | TRANSDERMAL | Status: AC
  Filled 2022-02-18: qty 1

## 2022-02-18 MED ORDER — ACETAMINOPHEN 325 MG PO TABS: 650.0000 mg | ORAL_TABLET | ORAL | Status: DC | PRN

## 2022-02-18 MED ORDER — SODIUM CHLORIDE 0.9 % IR SOLN
Status: DC | PRN
  Administered 2022-02-18: 1000 mL

## 2022-02-18 MED ORDER — ROCURONIUM BROMIDE 10 MG/ML (PF) SYRINGE
PREFILLED_SYRINGE | INTRAVENOUS | Status: DC | PRN
  Administered 2022-02-18: 50 mg via INTRAVENOUS

## 2022-02-18 MED ORDER — FENTANYL CITRATE (PF) 250 MCG/5ML IJ SOLN
INTRAMUSCULAR | Status: AC
  Filled 2022-02-18: qty 5

## 2022-02-18 MED ORDER — SODIUM CHLORIDE 0.9 % IV SOLN: 250.0000 mL | INTRAVENOUS | Status: DC | PRN

## 2022-02-18 MED ORDER — IPRATROPIUM-ALBUTEROL 0.5-2.5 (3) MG/3ML IN SOLN
3.0000 mL | Freq: Once | RESPIRATORY_TRACT | Status: AC
  Administered 2022-02-18: 3 mL via RESPIRATORY_TRACT

## 2022-02-18 MED ORDER — CHLORHEXIDINE GLUCONATE 0.12 % MT SOLN
15.0000 mL | Freq: Once | OROMUCOSAL | Status: AC
  Administered 2022-02-18: 15 mL via OROMUCOSAL
  Filled 2022-02-18: qty 15

## 2022-02-18 MED ORDER — TRAMADOL HCL 50 MG PO TABS: 50.0000 mg | ORAL_TABLET | Freq: Four times a day (QID) | ORAL | 0 refills | Status: AC | PRN

## 2022-02-18 MED ORDER — FENTANYL CITRATE (PF) 100 MCG/2ML IJ SOLN
25.0000 ug | INTRAMUSCULAR | Status: DC | PRN
  Administered 2022-02-18: 25 ug via INTRAVENOUS

## 2022-02-18 MED ORDER — ACETAMINOPHEN 10 MG/ML IV SOLN
1000.0000 mg | Freq: Once | INTRAVENOUS | Status: DC | PRN
  Administered 2022-02-18: 1000 mg via INTRAVENOUS

## 2022-02-18 MED ORDER — DEXAMETHASONE SODIUM PHOSPHATE 10 MG/ML IJ SOLN
INTRAMUSCULAR | Status: DC | PRN
  Administered 2022-02-18: 5 mg via INTRAVENOUS

## 2022-02-18 SURGICAL SUPPLY — 46 items
ADH SKN CLS APL DERMABOND .7 (GAUZE/BANDAGES/DRESSINGS) ×1
APL PRP STRL LF DISP 70% ISPRP (MISCELLANEOUS) ×1
APPLIER CLIP 5 13 M/L LIGAMAX5 (MISCELLANEOUS) ×2
APR CLP MED LRG 5 ANG JAW (MISCELLANEOUS) ×1
BAG COUNTER SPONGE SURGICOUNT (BAG) ×3 IMPLANT
BAG SPEC RTRVL LRG 6X4 10 (ENDOMECHANICALS) ×1
BAG SPNG CNTER NS LX DISP (BAG) ×1
CANISTER SUCT 3000ML PPV (MISCELLANEOUS) ×3 IMPLANT
CHLORAPREP W/TINT 26 (MISCELLANEOUS) ×3 IMPLANT
CLIP APPLIE 5 13 M/L LIGAMAX5 (MISCELLANEOUS) ×2 IMPLANT
CNTNR URN SCR LID CUP LEK RST (MISCELLANEOUS) ×2 IMPLANT
CONT SPEC 4OZ STRL OR WHT (MISCELLANEOUS) ×2
COVER MAYO STAND STRL (DRAPES) IMPLANT
COVER SURGICAL LIGHT HANDLE (MISCELLANEOUS) ×3 IMPLANT
DERMABOND ADVANCED (GAUZE/BANDAGES/DRESSINGS) ×1
DERMABOND ADVANCED .7 DNX12 (GAUZE/BANDAGES/DRESSINGS) ×2 IMPLANT
DRAPE C-ARM 42X120 X-RAY (DRAPES) IMPLANT
ELECT REM PT RETURN 9FT ADLT (ELECTROSURGICAL) ×2
ELECTRODE REM PT RTRN 9FT ADLT (ELECTROSURGICAL) ×2 IMPLANT
GAUZE 4X4 16PLY ~~LOC~~+RFID DBL (SPONGE) ×3 IMPLANT
GLOVE BIO SURGEON STRL SZ 6 (GLOVE) ×3 IMPLANT
GLOVE INDICATOR 6.5 STRL GRN (GLOVE) ×3 IMPLANT
GOWN STRL REUS W/ TWL LRG LVL3 (GOWN DISPOSABLE) ×6 IMPLANT
GOWN STRL REUS W/TWL LRG LVL3 (GOWN DISPOSABLE) ×6
GRASPER SUT TROCAR 14GX15 (MISCELLANEOUS) ×3 IMPLANT
HEMOSTAT SNOW SURGICEL 2X4 (HEMOSTASIS) ×1 IMPLANT
KIT BASIN OR (CUSTOM PROCEDURE TRAY) ×3 IMPLANT
KIT TURNOVER KIT B (KITS) ×3 IMPLANT
NDL INSUFFLATION 14GA 120MM (NEEDLE) ×2 IMPLANT
NEEDLE INSUFFLATION 14GA 120MM (NEEDLE) ×2 IMPLANT
NS IRRIG 1000ML POUR BTL (IV SOLUTION) ×3 IMPLANT
PAD ARMBOARD 7.5X6 YLW CONV (MISCELLANEOUS) ×3 IMPLANT
POUCH SPECIMEN RETRIEVAL 10MM (ENDOMECHANICALS) ×3 IMPLANT
SCISSORS LAP 5X35 DISP (ENDOMECHANICALS) ×3 IMPLANT
SET CHOLANGIOGRAPH 5 50 .035 (SET/KITS/TRAYS/PACK) IMPLANT
SET IRRIG TUBING LAPAROSCOPIC (IRRIGATION / IRRIGATOR) ×3 IMPLANT
SET TUBE SMOKE EVAC HIGH FLOW (TUBING) ×3 IMPLANT
SLEEVE ENDOPATH XCEL 5M (ENDOMECHANICALS) ×6 IMPLANT
SPONGE T-LAP 18X18 ~~LOC~~+RFID (SPONGE) ×3 IMPLANT
SUT MNCRL AB 4-0 PS2 18 (SUTURE) ×3 IMPLANT
TOWEL GREEN STERILE (TOWEL DISPOSABLE) ×3 IMPLANT
TOWEL GREEN STERILE FF (TOWEL DISPOSABLE) ×3 IMPLANT
TRAY LAPAROSCOPIC MC (CUSTOM PROCEDURE TRAY) ×3 IMPLANT
TROCAR XCEL NON-BLD 11X100MML (ENDOMECHANICALS) ×3 IMPLANT
TROCAR XCEL NON-BLD 5MMX100MML (ENDOMECHANICALS) ×3 IMPLANT
WATER STERILE IRR 1000ML POUR (IV SOLUTION) ×3 IMPLANT

## 2022-02-18 NOTE — ED Notes (Signed)
Pt would like to wait to receive any pain meds ?

## 2022-02-18 NOTE — Anesthesia Preprocedure Evaluation (Addendum)
Anesthesia Evaluation  ?Patient identified by MRN, date of birth, ID band ?Patient awake ? ? ? ?Reviewed: ?Allergy & Precautions, NPO status , Patient's Chart, lab work & pertinent test results ? ?Airway ?Mallampati: II ? ?TM Distance: >3 FB ?Neck ROM: Full ? ? ? Dental ?no notable dental hx. ? ?  ?Pulmonary ?asthma ,  ?  ?Pulmonary exam normal ? ? ? ? ? ? ? Cardiovascular ?hypertension, Pt. on medications ?Normal cardiovascular exam ? ? ?  ?Neuro/Psych ?negative neurological ROS ? negative psych ROS  ? GI/Hepatic ?negative GI ROS, Neg liver ROS,   ?Endo/Other  ?negative endocrine ROS ? Renal/GU ?negative Renal ROS  ? ?  ?Musculoskeletal ?negative musculoskeletal ROS ?(+)  ? Abdominal ?(+) + obese,   ?Peds ? Hematology ?negative hematology ROS ?(+)   ?Anesthesia Other Findings ?Acute Cholecystitis ? Reproductive/Obstetrics ? ?  ? ? ? ? ? ? ? ? ? ? ? ? ? ?  ?  ? ? ? ? ? ? ? ?Anesthesia Physical ?Anesthesia Plan ? ?ASA: 2 ? ?Anesthesia Plan: General  ? ?Post-op Pain Management:   ? ?Induction: Intravenous ? ?PONV Risk Score and Plan: 4 or greater and Ondansetron, Dexamethasone, Midazolam, Treatment may vary due to age or medical condition and Scopolamine patch - Pre-op ? ?Airway Management Planned: Oral ETT ? ?Additional Equipment:  ? ?Intra-op Plan:  ? ?Post-operative Plan: Extubation in OR ? ?Informed Consent: I have reviewed the patients History and Physical, chart, labs and discussed the procedure including the risks, benefits and alternatives for the proposed anesthesia with the patient or authorized representative who has indicated his/her understanding and acceptance.  ? ? ? ?Dental advisory given ? ?Plan Discussed with: CRNA ? ?Anesthesia Plan Comments:   ? ? ? ? ? ?Anesthesia Quick Evaluation ? ?

## 2022-02-18 NOTE — H&P (Signed)
? ? ? ? ?H&P Note ? ?Mary Mendez ?06/04/1957  ?[id number].   ? ?Requesting MD: Tanda Rockers, DO ?Chief Complaint/Reason for Consult: RUQ pain  ?HPI:  ?Patient is a 61 y/o F with PMH significant for asthma and HTN who presented to the ED with RUQ pain after eating nachos last night. She has had intermittent pain for the last year with increasing frequency and severity of episodes for the last week. Pain still present although lessening. She denies fever, chills, n/v. She took a muscle relaxant and tramadol overnight without full resolution in symptoms. Previous abdominal surgery includes laparoscopic hysterectomy. NKDA. No blood thinners. She is here with her daughter and lives at home with her husband. She works from home.  ? ?ROS: ?Review of Systems  ?Constitutional:  Negative for chills and fever.  ?Respiratory:  Negative for shortness of breath and wheezing.   ?Cardiovascular:  Negative for chest pain and palpitations.  ?Gastrointestinal:  Positive for abdominal pain. Negative for blood in stool, constipation, diarrhea, nausea and vomiting.  ?Genitourinary:  Negative for dysuria, frequency and urgency.  ?All other systems reviewed and are negative. ? ?No family history on file. ? ?History reviewed. No pertinent past medical history. ? ?History reviewed. No pertinent surgical history. ? ?Social History:  reports that she has never smoked. She has never been exposed to tobacco smoke. She has never used smokeless tobacco. She reports that she does not drink alcohol and does not use drugs. ? ?Allergies: No Known Allergies ? ?(Not in a hospital admission) ? ? ?Blood pressure 123/66, pulse 80, temperature 98 ?F (36.7 ?C), temperature source Oral, resp. rate 16, height 5\' 5"  (1.651 m), weight 98.4 kg, SpO2 98 %. ?Physical Exam:  ?General: pleasant, WD, overweight female who is laying in bed in NAD ?HEENT: head is normocephalic, atraumatic.  Sclera are anicteric.  Pupils equal and round.  Ears and nose without any  masses or lesions.  Mouth is pink and moist ?Heart: regular, rate, and rhythm.  Normal s1,s2. No obvious murmurs, gallops, or rubs noted.  Palpable radial and pedal pulses bilaterally ?Lungs: CTAB, no wheezes, rhonchi, or rales noted.  Respiratory effort nonlabored ?Abd: soft, mild to moderate ttp of RUQ, ND, +BS, no masses, hernias, or organomegaly ?MS: all 4 extremities are symmetrical with no cyanosis, clubbing, or edema. ?Skin: warm and dry with no masses, lesions, or rashes ?Neuro: Cranial nerves 2-12 grossly intact, sensation is normal throughout ?Psych: A&Ox3 with an appropriate affect. ? ? ?Results for orders placed or performed during the hospital encounter of 02/17/22 (from the past 48 hour(s))  ?CBC with Differential     Status: None  ? Collection Time: 02/17/22 11:33 PM  ?Result Value Ref Range  ? WBC 8.4 4.0 - 10.5 K/uL  ? RBC 4.58 3.87 - 5.11 MIL/uL  ? Hemoglobin 13.9 12.0 - 15.0 g/dL  ? HCT 41.5 36.0 - 46.0 %  ? MCV 90.6 80.0 - 100.0 fL  ? MCH 30.3 26.0 - 34.0 pg  ? MCHC 33.5 30.0 - 36.0 g/dL  ? RDW 13.7 11.5 - 15.5 %  ? Platelets 248 150 - 400 K/uL  ? nRBC 0.0 0.0 - 0.2 %  ? Neutrophils Relative % 60 %  ? Neutro Abs 5.0 1.7 - 7.7 K/uL  ? Lymphocytes Relative 30 %  ? Lymphs Abs 2.5 0.7 - 4.0 K/uL  ? Monocytes Relative 6 %  ? Monocytes Absolute 0.5 0.1 - 1.0 K/uL  ? Eosinophils Relative 3 %  ? Eosinophils Absolute  0.3 0.0 - 0.5 K/uL  ? Basophils Relative 0 %  ? Basophils Absolute 0.0 0.0 - 0.1 K/uL  ? Immature Granulocytes 1 %  ? Abs Immature Granulocytes 0.05 0.00 - 0.07 K/uL  ?  Comment: Performed at [facility], [address], [place], [place] [address]  ?Comprehensive metabolic panel     Status: Abnormal  ? Collection Time: 02/17/22 11:33 PM  ?Result Value Ref Range  ? Sodium 141 135 - 145 mmol/L  ? Potassium 4.2 3.5 - 5.1 mmol/L  ? Chloride 109 98 - 111 mmol/L  ? CO2 25 22 - 32 mmol/L  ? Glucose, Bld 130 (H) 70 - 99 mg/dL  ?  Comment: Glucose reference range applies only to samples taken  after fasting for at least 8 hours.  ? BUN 15 8 - 23 mg/dL  ? Creatinine, Ser 0.89 0.44 - 1.00 mg/dL  ? Calcium 9.0 8.9 - 10.3 mg/dL  ? Total Protein 6.3 (L) 6.5 - 8.1 g/dL  ? Albumin 3.7 3.5 - 5.0 g/dL  ? AST 47 (H) 15 - 41 U/L  ? ALT 48 (H) 0 - 44 U/L  ? Alkaline Phosphatase 75 38 - 126 U/L  ? Total Bilirubin 0.7 0.3 - 1.2 mg/dL  ? GFR, Estimated >60 >60 mL/min  ?  Comment: (NOTE) ?Calculated using the CKD-EPI Creatinine Equation (2021) ?  ? Anion gap 7 5 - 15  ?  Comment: Performed at [facility], [address], [place], [place] [address]  ?Lipase, blood     Status: None  ? Collection Time: 02/17/22 11:33 PM  ?Result Value Ref Range  ? Lipase 39 11 - 51 U/L  ?  Comment: Performed at [facility], [address] [place], [place] [address]  ? ?US Abdomen Limited RUQ (LIVER/GB) ? ?Result Date: 02/18/2022 ?CLINICAL DATA:  Pain after eating EXAM: ULTRASOUND ABDOMEN LIMITED RIGHT UPPER QUADRANT COMPARISON:  None. FINDINGS: Gallbladder: Contracted gallbladder with multiple shadowing stones. Gallbladder wall appears slightly thickened measuring up to 6 mm. No sonographic Murphy. Common bile duct: Diameter: 3.5 mm Liver: Liver is echogenic. No focal hepatic abnormality. Portal vein is patent on color Doppler imaging with normal direction of blood flow towards the liver. Other: None. IMPRESSION: 1. Cholelithiasis with mild gallbladder wall thickening but negative sonographic Murphy. Findings are indeterminate for cholecystitis. If further imaging is required, correlation with nuclear medicine hepatobiliary imaging would be recommended 2. Echogenic liver parenchyma consistent with hepatic steatosis Electronically Signed   By: Jasmine Pang M.D.   On: 02/18/2022 00:46   ? ? ? ?Assessment/Plan ?Acute cholecystitis  ?- RUQ [place] with mild gallbladder wall thickening and cholelithiasis  ?- no leukocytosis and afeb but still ttp on exam  ?- AST/ALT mildly elevated, Tbili normal ?- to OR for lap chole today, possible  IOC ? I have explained the procedure, risks, and aftercare of Laparoscopic cholecystectomy with IOC.  Risks include but are not limited to anesthesia (MI, CVA, death), bleeding, infection, wound problems, hernia, bile leak, injury to common bile duct/liver/intestine, increased risk of DVT/PE and diarrhea post op.  He/she seems to understand and agrees to proceed.  ? ?Possible discharge from PACU post-op pending intra-operative findings. If patient requires admission will admit to observation post-op.  ? ?HTN ?Asthma ? ?I reviewed RUQ [place], CMET/CBC.  ? ?Juliet Rude, PA-C ?[facility] ?02/18/2022, 11:06 AM ?Please see Amion for pager number during day hours 7:00am-4:30pm ? ? ?

## 2022-02-18 NOTE — Anesthesia Postprocedure Evaluation (Signed)
Anesthesia Post Note ? ?Patient: Merin Borjon ? ?Procedure(s) Performed: LAPAROSCOPIC CHOLECYSTECTOMY (Abdomen) ? ?  ? ?Patient location during evaluation: PACU ?Anesthesia Type: General ?Level of consciousness: awake ?Pain management: pain level controlled ?Vital Signs Assessment: post-procedure vital signs reviewed and stable ?Respiratory status: spontaneous breathing, nonlabored ventilation, respiratory function stable and patient connected to nasal cannula oxygen ?Cardiovascular status: blood pressure returned to baseline and stable ?Postop Assessment: no apparent nausea or vomiting ?Anesthetic complications: no ? ? ?No notable events documented. ? ?Last Vitals:  ?Vitals:  ? 02/18/22 1415 02/18/22 1430  ?BP: 114/84 133/80  ?Pulse: 78 82  ?Resp: 12 17  ?Temp:  36.4 ?C  ?SpO2: 98% 97%  ?  ?Last Pain:  ?Vitals:  ? 02/18/22 1430  ?TempSrc:   ?PainSc: 0-No pain  ? ? ?  ?  ?  ?  ?  ?  ? ?Liridona Mashaw P Amita Atayde ? ? ? ? ?

## 2022-02-18 NOTE — ED Notes (Signed)
Pt care report received from [place], [place]. Pt resting in bed, even unlabored resp. Pt c/o slight pain discomfort but does not want anything for pain right now but will alert nurse if it gets worse.  ?

## 2022-02-18 NOTE — Anesthesia Procedure Notes (Signed)
Procedure Name: Intubation ?Date/Time: 02/18/2022 12:51 PM ?Performed by: Barrington Ellison, CRNA ?Pre-anesthesia Checklist: Patient identified, Emergency Drugs available, Suction available and Patient being monitored ?Patient Re-evaluated:Patient Re-evaluated prior to induction ?Oxygen Delivery Method: Circle System Utilized ?Preoxygenation: Pre-oxygenation with 100% oxygen ?Induction Type: IV induction ?Ventilation: Mask ventilation without difficulty ?Laryngoscope Size: Mac and 3 ?Grade View: Grade I ?Tube type: Oral ?Tube size: 7.0 mm ?Number of attempts: 1 ?Airway Equipment and Method: Stylet and Oral airway ?Placement Confirmation: ETT inserted through vocal cords under direct vision, positive ETCO2 and breath sounds checked- equal and bilateral ?Secured at: 21 cm ?Tube secured with: Tape ?Dental Injury: Teeth and Oropharynx as per pre-operative assessment  ? ? ? ? ?

## 2022-02-18 NOTE — Discharge Instructions (Signed)
[facility] ?LAPAROSCOPIC SURGERY: POST OP INSTRUCTIONS ?Always review your discharge instruction sheet given to you by the facility where your surgery was performed. ?IF YOU HAVE DISABILITY OR FAMILY LEAVE FORMS, YOU MUST BRING THEM TO THE OFFICE FOR PROCESSING.   ?DO NOT GIVE THEM TO YOUR DOCTOR. ? ?PAIN CONTROL ? ?First take acetaminophen (Tylenol) AND/or ibuprofen (Advil) to control your pain after surgery.  Follow directions on package.  Taking acetaminophen (Tylenol) and/or ibuprofen (Advil) regularly after surgery will help to control your pain and lower the amount of prescription pain medication you may need.  You should not take more than 3,000 mg (3 grams) of acetaminophen (Tylenol) in 24 hours.  You should not take ibuprofen (Advil), aleve, motrin, naprosyn or other NSAIDS if you have a history of stomach ulcers or chronic kidney disease.  ?A prescription for pain medication may be given to you upon discharge.  Take your pain medication as prescribed, if you still have uncontrolled pain after taking acetaminophen (Tylenol) or ibuprofen (Advil). ?Use ice packs to help control pain. ?If you need a refill on your pain medication, please contact your pharmacy.  They will contact our office to request authorization. Prescriptions will not be filled after 5pm or on week-ends. ? ?HOME MEDICATIONS ?Take your usually prescribed medications unless otherwise directed. ? ?DIET ?You should follow a light diet the first few days after arrival home.  Be sure to include lots of fluids daily. Avoid fatty, fried foods.  ? ?CONSTIPATION ?It is common to experience some constipation after surgery and if you are taking pain medication.  Increasing fluid intake and taking a stool softener (such as Colace) will usually help or prevent this problem from occurring.  A mild laxative (Milk of Magnesia or Miralax) should be taken according to package instructions if there are no bowel movements after 48  hours. ? ?WOUND/INCISION CARE ?Most patients will experience some swelling and bruising in the area of the incisions.  Ice packs will help.  Swelling and bruising can take several days to resolve.  ?Unless discharge instructions indicate otherwise, follow guidelines below  ?STERI-STRIPS - you may remove your outer bandages 48 hours after surgery, and you may shower at that time.  You have steri-strips (small skin tapes) in place directly over the incision.  These strips should be left on the skin for 7-10 days.   ?DERMABOND/SKIN GLUE - you may shower in 24 hours.  The glue will flake off over the next 2-3 weeks. ?Any sutures or staples will be removed at the office during your follow-up visit. ? ?ACTIVITIES ?You may resume regular (light) daily activities beginning the next day--such as daily self-care, walking, climbing stairs--gradually increasing activities as tolerated.  You may have sexual intercourse when it is comfortable.  Refrain from any heavy lifting or straining until approved by your doctor. ?You may drive when you are no longer taking prescription pain medication, you can comfortably wear a seatbelt, and you can safely maneuver your car and apply brakes. ? ?FOLLOW-UP ?You should see your doctor in the office for a follow-up appointment approximately 2-3 weeks after your surgery.  You should have been given your post-op/follow-up appointment when your surgery was scheduled.  If you did not receive a post-op/follow-up appointment, make sure that you call for this appointment within a day or two after you arrive home to insure a convenient appointment time. ? ?WHEN TO CALL YOUR DOCTOR: ?Fever over 101.0 ?Inability to urinate ?Continued bleeding from incision. ?Increased  pain, redness, or drainage from the incision. ?Increasing abdominal pain ? ?The clinic staff is available to answer your questions during regular business hours.  Please don?t hesitate to call and ask to speak to one of the nurses for  clinical concerns.  If you have a medical emergency, go to the nearest emergency room or call 911.  A surgeon from [facility] is always on call at the hospital. ?[address], [address], [place], [place]  [address] ? [address] [address], [place], [place]   [address] ?[phone] ? [phone] ? FAX [phone] ?Web site: [email or web address]  ? ? ? ? ?Managing Your Pain After Surgery Without Opioids ? ? ? ?Thank you for participating in our program to help patients manage their pain after surgery without opioids. This is part of our effort to provide you with the best care possible, without exposing you or your family to the risk that opioids pose. ? ?What pain can I expect after surgery? ?You can expect to have some pain after surgery. This is normal. The pain is typically worse the day after surgery, and quickly begins to get better. ?Many studies have found that many patients are able to manage their pain after surgery with Over-the-Counter (OTC) medications such as Tylenol and Motrin. If you have a condition that does not allow you to take Tylenol or Motrin, notify your surgical team. ? ?How will I manage my pain? ?The best strategy for controlling your pain after surgery is around the clock pain control with Tylenol (acetaminophen) and Motrin (ibuprofen or Advil). Alternating these medications with each other allows you to maximize your pain control. In addition to Tylenol and Motrin, you can use heating pads or ice packs on your incisions to help reduce your pain. ? ?How will I alternate your regular strength over-the-counter pain medication? ?You will take a dose of pain medication every three hours. ?Start by taking 650 mg of Tylenol (2 pills of 325 mg) ?3 hours later take 600 mg of Motrin (3 pills of 200 mg) ?3 hours after taking the Motrin take 650 mg of Tylenol ?3 hours after that take 600 mg of Motrin. ? ? ?- 1 - ? ?See example - if your first dose of Tylenol is at 12:00  PM ? ? ?12:00 PM Tylenol 650 mg (2 pills of 325 mg)  ?3:00 PM Motrin 600 mg (3 pills of 200 mg)  ?6:00 PM Tylenol 650 mg (2 pills of 325 mg)  ?9:00 PM Motrin 600 mg (3 pills of 200 mg)  ?Continue alternating every 3 hours  ? ?We recommend that you follow this schedule around-the-clock for at least 3 days after surgery, or until you feel that it is no longer needed. Use the table on the last page of this handout to keep track of the medications you are taking. ?Important: ?Do not take more than 3000mg  of Tylenol or 3200mg  of Motrin in a 24-hour period. ?Do not take ibuprofen/Motrin if you have a history of bleeding stomach ulcers, severe kidney disease, &/or actively taking a blood thinner ? ?What if I still have pain? ?If you have pain that is not controlled with the over-the-counter pain medications (Tylenol and Motrin or Advil) you might have what we call ?breakthrough? pain. You will receive a prescription for a small amount of an opioid pain medication such as Oxycodone, Tramadol, or Tylenol with Codeine. Use these opioid pills in the first 24 hours after surgery if you have breakthrough pain. Do not  take more than 1 pill every 4-6 hours. ? ?If you still have uncontrolled pain after using all opioid pills, don't hesitate to call our staff using the number provided. We will help make sure you are managing your pain in the best way possible, and if necessary, we can provide a prescription for additional pain medication. ? ? ?Day 1   ? ?Time  ?Name of Medication Number of pills taken  ?Amount of Acetaminophen  ?Pain Level  ? ?Comments  ?AM PM       ?AM PM       ?AM PM       ?AM PM       ?AM PM       ?AM PM       ?AM PM       ?AM PM       ?Total Daily amount of Acetaminophen ?Do not take more than  3,000 mg per day    ? ? ?Day 2   ? ?Time  ?Name of Medication Number of pills ?taken  ?Amount of Acetaminophen  ?Pain Level  ? ?Comments  ?AM PM       ?AM PM       ?AM PM       ?AM PM       ?AM PM       ?AM PM       ?AM  PM       ?AM PM       ?Total Daily amount of Acetaminophen ?Do not take more than  3,000 mg per day    ? ? ?Day 3   ? ?Time  ?Name of Medication Number of pills taken  ?Amount of Acetaminophen  ?Pain Level  ? ?Commen

## 2022-02-18 NOTE — Transfer of Care (Signed)
Immediate Anesthesia Transfer of Care Note ? ?Patient: Mary Mendez ? ?Procedure(s) Performed: LAPAROSCOPIC CHOLECYSTECTOMY (Abdomen) ? ?Patient Location: PACU ? ?Anesthesia Type:General ? ?Level of Consciousness: awake, alert  and oriented ? ?Airway & Oxygen Therapy: Patient Spontanous Breathing ? ?Post-op Assessment: Report given to RN ? ?Post vital signs: Reviewed and stable ? ?Last Vitals:  ?Vitals Value Taken Time  ?BP 119/79 02/18/22 1356  ?Temp    ?Pulse 104 02/18/22 1357  ?Resp 14 02/18/22 1357  ?SpO2 95 % 02/18/22 1357  ?Vitals shown include unvalidated device data. ? ?Last Pain:  ?Vitals:  ? 02/18/22 1219  ?TempSrc: Oral  ?PainSc:   ?   ? ?  ? ?Complications: No notable events documented. ?

## 2022-02-18 NOTE — Op Note (Signed)
Operative Note ? ?Mary Mendez 61 y.o. female ?[id number]  ?02/18/2022 ? ?Surgeon: Berna Bue MD FACS ? ?Procedure performed: Laparoscopic Cholecystectomy ? ?Procedure classification: emergent ? ?Preop diagnosis: Acute cholecystitis ?Post-op diagnosis/intraop findings: Acute on chronic cholecystitis, hepatosteatosis ? ?Specimens: gallbladder ? ?Retained items: none ? ?EBL: 30cc ? ?Complications: none ? ?Description of procedure: After obtaining informed consent the patient was brought to the operating room. Antibiotics were administered. SCD's were applied. General endotracheal anesthesia was initiated and a formal time-out was performed. The abdomen was prepped and draped in the usual sterile fashion and the abdomen was entered using an infraumbilical veress needle after instilling the site with local. Insufflation to was obtained, 32mm trocar and camera inserted, and gross inspection revealed no evidence of injury from our entry or other intraabdominal abnormalities. Two 66mm trocars were introduced in the right midclavicular and right anterior axillary lines under direct visualization and following infiltration with local. An 106mm trocar was placed in the epigastrium.  The liver was noted to be enlarged and with an appearance consistent with hepatic steatosis.  Omental adhesions to the gallbladder were taken down with cautery and blunt dissection.  The gallbladder was thickened, with extensive fat infiltration of the peritoneum overlying the gallbladder.  The gallbladder was retracted cephalad and the infundibulum was retracted laterally. A combination of hook electrocautery and blunt dissection was utilized to clear the peritoneum from the neck and cystic duct, circumferentially isolating the cystic artery and cystic duct and lifting the gallbladder from the cystic plate. The critical view of safety was achieved with the cystic artery, cystic duct, and liver bed visualized between them with no other  structures. The artery was clipped with a single clip proximally and distally and divided as was the cystic duct with three clips on the proximal end.  An additional posterior branch of the cystic artery was clipped proximally before dividing distally with cautery. The gallbladder was dissected from the liver plate using electrocautery. Once freed the gallbladder was placed in an endocatch bag and removed intact through the epigastric trocar site. A small amount of bleeding on the liver bed was controlled with cautery and Surgicel snow. The right upper quadrant was irrigated and aspirated, the effluent was clear. Hemostasis was once again confirmed, and reinspection of the abdomen revealed no injuries. The clips were well opposed without any bile leak from the duct or the liver bed. The 80mm trocar site in the epigastrium was closed with a 0 vicryl in the fascia under direct visualization using a PMI device. The abdomen was desufflated and all trocars removed. The skin incisions were closed with subcuticular 4-0 monocryl and Dermabond. The patient was awakened, extubated and transported to the recovery room in stable condition.  ?  ?All counts were correct at the completion of the case. ? ? ?  ?

## 2022-02-19 ENCOUNTER — Encounter (HOSPITAL_COMMUNITY): Payer: Self-pay | Admitting: Surgery

## 2022-02-19 ENCOUNTER — Other Ambulatory Visit: Payer: Self-pay

## 2022-02-19 NOTE — ED Provider Notes (Signed)
?[facility] ?Provider Note ? ? ?CSN: [id number] ?Arrival date & time: 02/17/22  2255 ? ?  ? ?History ?No chief complaint on file. ? ? ?Mary Mendez is a 61 y.o. female tree of hypertension and asthma presents to the emergency department for evaluation of right upper quadrant pain radiating to her back that is been off and on for the past 6 days but worsening last night.  She has had this pain previously for months intermittently.  The patient reports that she ate notches, chicken wings, and chips tonight with her sister and has been severe since.  She denies any nausea or vomiting.  Denies any fevers.  Denies any diarrhea or constipation.  She reports she was seen by her PCP recently for this and was put on muscle relaxers.  She reports MS relaxers have not helped with her pain.  She denies any chest pain or shortness of breath. ? ?HPI ? ?  ? ?Home Medications ?Prior to Admission medications   ?Medication Sig Start Date End Date Taking? Authorizing Provider  ?acetaminophen (TYLENOL) 500 MG tablet Take 1,000 mg by mouth every 6 (six) hours as needed for mild pain.   Yes [provider]  ?albuterol (VENTOLIN HFA) 108 (90 Base) MCG/ACT inhaler Inhale into the lungs every 6 (six) hours as needed for wheezing or shortness of breath.   Yes [provider]  ?cyclobenzaprine (FLEXERIL) 5 MG tablet Take 5 mg by mouth 3 (three) times daily as needed for muscle spasms.   Yes [provider]  ?docusate sodium (COLACE) 100 MG capsule Take 1 capsule (100 mg total) by mouth 2 (two) times daily. Okay to decrease to once daily or stop taking if having loose bowel movements 02/18/22 03/20/22 Yes Clovis Arlana Canizales, MD  ?Ibuprofen 200 MG CAPS Take 200 mg by mouth every 8 (eight) hours as needed. Pain   Yes [provider]  ?lisinopril (ZESTRIL) 5 MG tablet Take 5 mg by mouth daily.   Yes [provider]  ?naproxen (NAPROSYN) 500 MG tablet Take 500 mg by mouth 2 (two) times  daily with a meal.   Yes [provider]  ?traMADol (ULTRAM) 50 MG tablet Take 1 tablet (50 mg total) by mouth every 6 (six) hours as needed for up to 5 days (Pain not controlled by Tylenol, ibuprofen, rest or ice). 02/18/22 02/23/22 Yes Clovis Achol Azpeitia, MD  ?   ? ?Allergies    ?Patient has no known allergies.   ? ?Review of Systems   ?Review of Systems  ?Constitutional:  Negative for chills and fever.  ?Respiratory:  Negative for shortness of breath.   ?Cardiovascular:  Negative for chest pain.  ?Gastrointestinal:  Positive for abdominal pain. Negative for constipation, diarrhea, nausea and vomiting.  ?Genitourinary:  Negative for dysuria and hematuria.  ?Musculoskeletal:  Positive for back pain.  ? ?Physical Exam ?Updated Vital Signs ?BP 133/80 (BP Location: Right Arm)   Pulse 82   Temp 97.6 ?F (36.4 ?C)   Resp 17   Ht $R'5\' 5"'fH$  (1.651 m)   Wt 98.4 kg   SpO2 97%   BMI 36.11 kg/m?  ?Physical Exam ?Vitals and nursing note reviewed.  ?Constitutional:   ?   General: She is not in acute distress. ?   Appearance: Normal appearance. She is not toxic-appearing.  ?HENT:  ?   Head: Normocephalic and atraumatic.  ?   Mouth/Throat:  ?   Mouth: Mucous membranes are moist.  ?Eyes:  ?  General: No scleral icterus. ?Cardiovascular:  ?   Rate and Rhythm: Normal rate and regular rhythm.  ?Pulmonary:  ?   Effort: Pulmonary effort is normal.  ?   Breath sounds: Normal breath sounds.  ?Abdominal:  ?   General: Bowel sounds are normal.  ?   Palpations: Abdomen is soft.  ?   Tenderness: There is abdominal tenderness. There is no right CVA tenderness, left CVA tenderness, guarding or rebound.  ?   Comments: Abdominal exam limited secondary to body habitus.  Patient's abdomen is soft with normal active bowel sounds.  She does have some right upper quadrant tenderness without any guarding or rebound.  Positive Murphy sign.  ?Musculoskeletal:     ?   General: No deformity.  ?   Cervical back: Normal range of motion.  ?Skin: ?    General: Skin is warm and dry.  ?Neurological:  ?   General: No focal deficit present.  ?   Mental Status: She is alert. Mental status is at baseline.  ? ? ?ED Results / Procedures / Treatments   ?Labs ?(all labs ordered are listed, but only abnormal results are displayed) ?Labs Reviewed  ?COMPREHENSIVE METABOLIC PANEL - Abnormal; Notable for the following components:  ?    Result Value  ? Glucose, Bld 130 (*)   ? Total Protein 6.3 (*)   ? AST 47 (*)   ? ALT 48 (*)   ? All other components within normal limits  ?CBC WITH DIFFERENTIAL/PLATELET  ?LIPASE, BLOOD  ?SURGICAL PATHOLOGY  ? ? ?EKG ?None ? ?Radiology ?US Abdomen Limited RUQ (LIVER/GB) ? ?Result Date: 02/18/2022 ?CLINICAL DATA:  Pain after eating EXAM: ULTRASOUND ABDOMEN LIMITED RIGHT UPPER QUADRANT COMPARISON:  None. FINDINGS: Gallbladder: Contracted gallbladder with multiple shadowing stones. Gallbladder wall appears slightly thickened measuring up to 6 mm. No sonographic Murphy. Common bile duct: Diameter: 3.5 mm Liver: Liver is echogenic. No focal hepatic abnormality. Portal vein is patent on color Doppler imaging with normal direction of blood flow towards the liver. Other: None. IMPRESSION: 1. Cholelithiasis with mild gallbladder wall thickening but negative sonographic Murphy. Findings are indeterminate for cholecystitis. If further imaging is required, correlation with nuclear medicine hepatobiliary imaging would be recommended 2. Echogenic liver parenchyma consistent with hepatic steatosis Electronically Signed   By: Donavan Foil M.D.   On: 02/18/2022 00:46   ? ?Procedures ?Procedures  ? ?Medications Ordered in ED ?Medications  ?cefTRIAXone (ROCEPHIN) 2 g in sodium chloride 0.9 % 100 mL IVPB (2 g Intravenous New Bag/Given 02/18/22 1251)  ?chlorhexidine (PERIDEX) 0.12 % solution 15 mL (15 mLs Mouth/Throat Given 02/18/22 1225)  ?  Or  ?MEDLINE mouth rinse ( Mouth Rinse See Alternative 02/18/22 1225)  ?ipratropium-albuterol (DUONEB) 0.5-2.5 (3) MG/3ML  nebulizer solution 3 mL (3 mLs Nebulization Given 02/18/22 1359)  ? ? ?ED Course/ Medical Decision Making/ A&P ?  ?                        ?Medical Decision Making ? ?61 year old female presents emerged department for evaluation of right upper quadrant pain radiating to her back that is been off and on since Sunday but gradually worsening last night.  Differential diagnosis includes was not limited to pancreatitis, cholecystitis, cholelithiasis, choledocholithiasis, cholangitis, gastritis.  Vital signs are unremarkable.  Patient normotensive, afebrile, normal pulse rate, satting well on room air without any increased work of breathing.  Physical exam is pertinent for abdominal exam limited secondary to body habitus.  Patient's  abdomen is soft with normal active bowel sounds.  She does have some right upper quadrant tenderness without any guarding or rebound.  Positive Murphy sign.  No CVA tenderness. ? ?I intimately reviewed and interpreted the patient's labs and imaging and agree with radiologist interpretation. Lipase normal.  CBC shows no leukocytosis or anemia.  CMP shows mildly elevated glucose at 130 although not fasting.  Decreased total protein 8.3.  She does have mildly elevated AST and ALT however normal bilirubin and alk phos.  Ultrasound of the right upper quadrant shows cholelithiasis with mild gallbladder wall thickening but negative sonographic Murphy sign.  The findings are indeterminate for cholecystitis.  Recommends possible further imaging required.  Echogenic liver parenchyma consistent with hepatic steatosis also visualized. ? ?Given the patient's pain and ultrasound readings, consult was placed to general surgeon.  Spoke to Dr. Amado Coe who will send the PA to evaluate at bedside.  Barkley Boards evaluates at bedside and will take patient to the OR later in the day. ? ?Final Clinical Impression(s) / ED Diagnoses ?Final diagnoses:  ?Acute on chronic cholecystitis  ? ?Rx / DC Orders ?ED Discharge  Orders   ? ?      Ordered  ?  docusate sodium (COLACE) 100 MG capsule  2 times daily       ? 02/18/22 1356  ?  traMADol (ULTRAM) 50 MG tablet  Every 6 hours PRN       ? 02/18/22 1356  ? ?  ?  ? ?  ? ? ?  ?Norval Gable, [place]

## 2022-02-20 ENCOUNTER — Encounter (HOSPITAL_COMMUNITY): Payer: Self-pay | Admitting: Emergency Medicine

## 2022-02-21 LAB — SURGICAL PATHOLOGY

## 2022-03-22 DIAGNOSIS — L908 Other atrophic disorders of skin: Secondary | ICD-10-CM | POA: Diagnosis not present

## 2022-03-22 DIAGNOSIS — L821 Other seborrheic keratosis: Secondary | ICD-10-CM | POA: Diagnosis not present

## 2022-03-22 DIAGNOSIS — L820 Inflamed seborrheic keratosis: Secondary | ICD-10-CM | POA: Diagnosis not present

## 2022-04-11 DIAGNOSIS — Z01419 Encounter for gynecological examination (general) (routine) without abnormal findings: Secondary | ICD-10-CM | POA: Diagnosis not present

## 2022-04-11 DIAGNOSIS — Z1231 Encounter for screening mammogram for malignant neoplasm of breast: Secondary | ICD-10-CM | POA: Diagnosis not present

## 2022-04-11 DIAGNOSIS — D069 Carcinoma in situ of cervix, unspecified: Secondary | ICD-10-CM | POA: Diagnosis not present

## 2022-04-11 DIAGNOSIS — Z6839 Body mass index (BMI) 39.0-39.9, adult: Secondary | ICD-10-CM | POA: Diagnosis not present

## 2022-04-18 ENCOUNTER — Encounter: Payer: Self-pay | Admitting: Pulmonary Disease

## 2022-04-18 ENCOUNTER — Ambulatory Visit: Payer: BC Managed Care – PPO | Admitting: Pulmonary Disease

## 2022-04-18 VITALS — BP 132/76 | HR 80 | Temp 98.3°F | Ht 65.0 in | Wt 224.0 lb

## 2022-04-18 DIAGNOSIS — R911 Solitary pulmonary nodule: Secondary | ICD-10-CM | POA: Diagnosis not present

## 2022-04-18 NOTE — Progress Notes (Signed)
Berna Gitto    [id number]    06/04/1957  Primary Care Physician:Pcp, No  Referring Physician: No referring provider defined for this encounter.  Chief complaint:  Follow up for lung nodules, asthma  HPI:  61 year old with history of mild intermittent asthma.  She had incidental findings of lung nodules on a CT chest done to evaluate swelling over the clavicle.  She has been referred here for further evaluation She has complaints of cough, sinus congestion from viral infection.  Denies any fevers, chills, sputum production, hemoptysis, loss of weight, loss of appetite.  She has history of mild intermittent asthma and is on albuterol respiclick.  She hardly needs to use the rescue inhaler.  She feels that the powder formulation does not work as well and is requesting switching to a regular inhaler.  Pets: None Occupation: Self-employed as a Programme researcher, broadcasting/film/video Exposures: No known exposures Smoking history: Non Smoker, exposed to secondhand smoke as a child Travel History: Grew up in [place].  Moved to [place] in 1994.  Interim history: She is here after a gap of 3 years.  We had planned for a follow-up CT at last visit but she did not get that done States that breathing is stable with no issues  Outpatient Encounter Medications as of 04/18/2022  Medication Sig   albuterol (VENTOLIN HFA) 108 (90 Base) MCG/ACT inhaler Inhale into the lungs every 6 (six) hours as needed for wheezing or shortness of breath.   lisinopril (ZESTRIL) 5 MG tablet Take 5 mg by mouth daily.   tretinoin (RETIN-A) 0.05 % cream Apply 1 application topically at bedtime. Apply to face   acetaminophen (TYLENOL) 500 MG tablet Take 1,000 mg by mouth every 6 (six) hours as needed for mild pain. (Patient not taking: Reported on 04/18/2022)   albuterol (PROVENTIL) (2.5 MG/3ML) 0.083% nebulizer solution Take 3 mLs (2.5 mg total) by nebulization every 6 (six) hours as needed for wheezing or shortness of breath. (Patient  not taking: Reported on 04/18/2022)   ibuprofen (ADVIL) 200 MG tablet Take 400 mg by mouth every 6 (six) hours as needed for headache (pain). (Patient not taking: Reported on 04/18/2022)   Ibuprofen 200 MG CAPS Take 200 mg by mouth every 8 (eight) hours as needed. Pain (Patient not taking: Reported on 04/18/2022)   naproxen (NAPROSYN) 500 MG tablet Take 500 mg by mouth 2 (two) times daily with a meal. (Patient not taking: Reported on 04/18/2022)   Omega-3 Fatty Acids (FISH OIL PO) Take 1 capsule by mouth daily. (Patient not taking: Reported on 04/18/2022)   [DISCONTINUED] albuterol (PROAIR HFA) 108 (90 Base) MCG/ACT inhaler Inhale 2 puffs into the lungs every 6 (six) hours as needed for wheezing or shortness of breath.   [DISCONTINUED] cyclobenzaprine (FLEXERIL) 5 MG tablet Take 5 mg by mouth 3 (three) times daily as needed for muscle spasms. (Patient not taking: Reported on 04/18/2022)   No facility-administered encounter medications on file as of 04/18/2022.   Physical Exam: Blood pressure 132/76, pulse 80, temperature 98.3 F (36.8 C), temperature source Oral, height 5\' 5"  (1.651 m), weight 224 lb (101.6 kg), SpO2 100 %. Gen:      No acute distress HEENT:  EOMI, sclera anicteric Neck:     No masses; no thyromegaly Lungs:    Clear to auscultation bilaterally; normal respiratory effort CV:         Regular rate and rhythm; no murmurs Abd:      + bowel sounds; soft,  non-tender; no palpable masses, no distension Ext:    No edema; adequate peripheral perfusion Skin:      Warm and dry; no rash Neuro: alert and oriented x 3 Psych: normal mood and affect   Data Reviewed: CT chest 10/17/17 2 mm left upper lobe granuloma,4 mm right upper lobe granuloma.  No other abnormality.  CT chest 12/25/2018 Stable 4 mm nodule in the right upper lobe.  Left upper lobe nodule is not obvious on this exam. I have reviewed the images personally.  FENO 11/08/17-27  Assessment:  Incidental finding of lung  nodules Stable on follow-up CT and are probably benign.  She is a non-smoker with no risk factors. This could be sequelae of prior history of histo infection as she grew up in [place]. Suspicion for malignancy is low.   Order follow-up CT scan.  If stable then no further follow-up is needed  Mild intermittent asthma Albuterol PRN.  Plan/Recommendations: - CT chest without contrast follow-up.  Chilton Greathouse MD [facility] 04/18/2022, 4:02 PM  CC: No ref. provider found

## 2022-04-18 NOTE — Patient Instructions (Signed)
We will order a CT chest without contrast for follow up of lung nodules

## 2022-04-27 ENCOUNTER — Ambulatory Visit (INDEPENDENT_AMBULATORY_CARE_PROVIDER_SITE_OTHER)
Admission: RE | Admit: 2022-04-27 | Discharge: 2022-04-27 | Disposition: A | Discharge status: Home / Self Care | Payer: BC Managed Care – PPO | Source: Ambulatory Visit | Attending: Pulmonary Disease | Admitting: Pulmonary Disease

## 2022-04-27 DIAGNOSIS — R911 Solitary pulmonary nodule: Secondary | ICD-10-CM

## 2022-04-27 DIAGNOSIS — I700 Atherosclerosis of aorta: Secondary | ICD-10-CM | POA: Diagnosis not present

## 2022-05-26 DIAGNOSIS — R112 Nausea with vomiting, unspecified: Secondary | ICD-10-CM | POA: Insufficient documentation

## 2022-05-26 DIAGNOSIS — R197 Diarrhea, unspecified: Secondary | ICD-10-CM | POA: Diagnosis not present

## 2022-07-25 DIAGNOSIS — M5411 Radiculopathy, occipito-atlanto-axial region: Secondary | ICD-10-CM | POA: Diagnosis not present

## 2022-07-25 DIAGNOSIS — M9901 Segmental and somatic dysfunction of cervical region: Secondary | ICD-10-CM | POA: Diagnosis not present

## 2022-07-26 DIAGNOSIS — M5411 Radiculopathy, occipito-atlanto-axial region: Secondary | ICD-10-CM | POA: Diagnosis not present

## 2022-07-26 DIAGNOSIS — M9901 Segmental and somatic dysfunction of cervical region: Secondary | ICD-10-CM | POA: Diagnosis not present

## 2022-07-27 DIAGNOSIS — M5411 Radiculopathy, occipito-atlanto-axial region: Secondary | ICD-10-CM | POA: Diagnosis not present

## 2022-07-27 DIAGNOSIS — M9901 Segmental and somatic dysfunction of cervical region: Secondary | ICD-10-CM | POA: Diagnosis not present

## 2022-07-31 DIAGNOSIS — M5411 Radiculopathy, occipito-atlanto-axial region: Secondary | ICD-10-CM | POA: Diagnosis not present

## 2022-07-31 DIAGNOSIS — M9901 Segmental and somatic dysfunction of cervical region: Secondary | ICD-10-CM | POA: Diagnosis not present

## 2022-09-07 DIAGNOSIS — R1030 Lower abdominal pain, unspecified: Secondary | ICD-10-CM | POA: Diagnosis not present

## 2022-09-07 DIAGNOSIS — K5900 Constipation, unspecified: Secondary | ICD-10-CM | POA: Insufficient documentation

## 2022-09-15 ENCOUNTER — Other Ambulatory Visit: Payer: Self-pay | Admitting: *Deleted

## 2022-09-15 ENCOUNTER — Encounter: Payer: Self-pay | Admitting: *Deleted

## 2022-09-15 DIAGNOSIS — R109 Unspecified abdominal pain: Secondary | ICD-10-CM

## 2022-09-29 ENCOUNTER — Ambulatory Visit
Admission: RE | Admit: 2022-09-29 | Discharge: 2022-09-29 | Disposition: A | Discharge status: Home / Self Care | Payer: BC Managed Care – PPO | Source: Ambulatory Visit | Attending: *Deleted | Admitting: *Deleted

## 2022-09-29 DIAGNOSIS — M2578 Osteophyte, vertebrae: Secondary | ICD-10-CM | POA: Diagnosis not present

## 2022-09-29 DIAGNOSIS — K5730 Diverticulosis of large intestine without perforation or abscess without bleeding: Secondary | ICD-10-CM | POA: Diagnosis not present

## 2022-09-29 DIAGNOSIS — K5900 Constipation, unspecified: Secondary | ICD-10-CM | POA: Diagnosis not present

## 2022-09-29 DIAGNOSIS — R109 Unspecified abdominal pain: Secondary | ICD-10-CM

## 2022-09-29 DIAGNOSIS — K429 Umbilical hernia without obstruction or gangrene: Secondary | ICD-10-CM | POA: Diagnosis not present

## 2022-09-29 MED ORDER — IOPAMIDOL (ISOVUE-300) INJECTION 61%
100.0000 mL | Freq: Once | INTRAVENOUS | Status: AC | PRN
  Administered 2022-09-29: 100 mL via INTRAVENOUS

## 2022-10-09 ENCOUNTER — Encounter: Payer: Self-pay | Admitting: Gastroenterology

## 2022-10-27 ENCOUNTER — Ambulatory Visit (INDEPENDENT_AMBULATORY_CARE_PROVIDER_SITE_OTHER): Payer: BC Managed Care – PPO

## 2022-10-27 ENCOUNTER — Ambulatory Visit: Payer: BC Managed Care – PPO | Admitting: Podiatry

## 2022-10-27 DIAGNOSIS — M7662 Achilles tendinitis, left leg: Secondary | ICD-10-CM | POA: Diagnosis not present

## 2022-10-27 NOTE — Progress Notes (Signed)
Subjective:  Patient ID: Mary Mendez, female    DOB: [birth year],  MRN: [id number]  Chief Complaint  Patient presents with   Foot Pain     On and off for the pass 4 weeks. Patient states pain is constant but it is more of a sore pain. Painful when pressure is applied no redness or swelling     61 y.o. female presents with the above complaint.  Patient presents with left Achilles tendon insertional pain pain on palpation of pain with ambulation. At this she states that has been going on and off for 4 weeks she would like to get it evaluated she has not seen anyone as prior to seeing me she states there is no redness or swelling.  But it does hurt with ambulation she wears tennis shoes.   Review of Systems: Negative except as noted in the HPI. Denies N/V/F/Ch.  Past Medical History:  Diagnosis Date   Asthma    Bronchitis    COVID-19 08/2021   Hypertension    Pneumonia     Current Outpatient Medications:    acetaminophen (TYLENOL) 500 MG tablet, Take 1,000 mg by mouth every 6 (six) hours as needed for mild pain. (Patient not taking: Reported on 04/18/2022), Disp: , Rfl:    albuterol (PROVENTIL) (2.5 MG/3ML) 0.083% nebulizer solution, Take 3 mLs (2.5 mg total) by nebulization every 6 (six) hours as needed for wheezing or shortness of breath. (Patient not taking: Reported on 04/18/2022), Disp: 75 mL, Rfl: 12   albuterol (VENTOLIN HFA) 108 (90 Base) MCG/ACT inhaler, Inhale into the lungs every 6 (six) hours as needed for wheezing or shortness of breath., Disp: , Rfl:    ibuprofen (ADVIL) 200 MG tablet, Take 400 mg by mouth every 6 (six) hours as needed for headache (pain). (Patient not taking: Reported on 04/18/2022), Disp: , Rfl:    Ibuprofen 200 MG CAPS, Take 200 mg by mouth every 8 (eight) hours as needed. Pain (Patient not taking: Reported on 04/18/2022), Disp: , Rfl:    lisinopril (ZESTRIL) 5 MG tablet, Take 5 mg by mouth daily., Disp: , Rfl:    naproxen (NAPROSYN) 500 MG tablet, Take 500  mg by mouth 2 (two) times daily with a meal. (Patient not taking: Reported on 04/18/2022), Disp: , Rfl:    Omega-3 Fatty Acids (FISH OIL PO), Take 1 capsule by mouth daily. (Patient not taking: Reported on 04/18/2022), Disp: , Rfl:    tretinoin (RETIN-A) 0.05 % cream, Apply 1 application topically at bedtime. Apply to face, Disp: , Rfl: 9  Social History   Tobacco Use  Smoking Status Never   Passive exposure: Never  Smokeless Tobacco Never    No Known Allergies Objective:  There were no vitals filed for this visit. There is no height or weight on file to calculate BMI. Constitutional Well developed. Well nourished.  Vascular Dorsalis pedis pulses palpable bilaterally. Posterior tibial pulses palpable bilaterally. Capillary refill normal to all digits.  No cyanosis or clubbing noted. Pedal hair growth normal.  Neurologic Normal speech. Oriented to person, place, and time. Epicritic sensation to light touch grossly present bilaterally.  Dermatologic Nails well groomed and normal in appearance. No open wounds. No skin lesions.  Orthopedic: Pain on palpation to the left Achilles tendon insertion.  Pain with dorsiflexion of the ankle joint no pain with plantarflexion of the ankle joint.  No deep intra-articular ankle pain noted no pain at the ATFL ligament, peroneal tendon, posterior tibial tendon.  Positive Silfverskiold test with  gastrocnemius equinus.  Haglund's deformity noted   Radiographs: 3 views of skeletally mature adult left foot: None decreasing calcaneal inclination angle increasing talar declination angle anterior break in the cyma line.  Posterior and plantar heel spurring noted.  Pes planovalgus foot structure noted.  No other bony abnormalities identified Assessment:   1. Left Achilles tendinitis    Plan:  Patient was evaluated and treated and all questions answered.  Left Achilles tendinitis -I explained the patient the etiology of Achilles tendinitis and various  treatment options were discussed.  Given the amount of pain that she is experiencing she will benefit from cam boot immobilization to help decrease acute inflammatory component associate with pain.  Patient agrees with plan like to proceed with cam boot immobilization. -Cam boot was dispensed.  No follow-ups on file.

## 2022-11-23 ENCOUNTER — Ambulatory Visit: Payer: BC Managed Care – PPO | Admitting: Podiatry

## 2022-11-23 DIAGNOSIS — Q667 Congenital pes cavus, unspecified foot: Secondary | ICD-10-CM

## 2022-11-23 DIAGNOSIS — Q6671 Congenital pes cavus, right foot: Secondary | ICD-10-CM | POA: Diagnosis not present

## 2022-11-23 DIAGNOSIS — Q6672 Congenital pes cavus, left foot: Secondary | ICD-10-CM

## 2022-11-23 DIAGNOSIS — L820 Inflamed seborrheic keratosis: Secondary | ICD-10-CM | POA: Diagnosis not present

## 2022-11-23 DIAGNOSIS — L988 Other specified disorders of the skin and subcutaneous tissue: Secondary | ICD-10-CM | POA: Diagnosis not present

## 2022-11-23 DIAGNOSIS — L821 Other seborrheic keratosis: Secondary | ICD-10-CM | POA: Diagnosis not present

## 2022-11-23 DIAGNOSIS — M7662 Achilles tendinitis, left leg: Secondary | ICD-10-CM

## 2022-11-23 NOTE — Progress Notes (Signed)
Subjective:  Patient ID: Mary Mendez, female    DOB: 28-Nov-1960,  MRN: [id number]  Chief Complaint  Patient presents with   Foot Pain    Pt stated that she is doing better     62 y.o. female presents with the above complaint.  Patient presents with follow-up of left Achilles tendinitis insertional pain.  Patient states that she is doing a lot better the cam boot immobilization helped.  She does not have any other pain.  She would like to discuss the next treatment plan.    f Systems: Negative except as noted in the HPI. Denies N/V/F/Ch.  Past Medical History:  Diagnosis Date   Asthma    Bronchitis    COVID-19 08/2021   Hypertension    Pneumonia     Current Outpatient Medications:    acetaminophen (TYLENOL) 500 MG tablet, Take 1,000 mg by mouth every 6 (six) hours as needed for mild pain. (Patient not taking: Reported on 04/18/2022), Disp: , Rfl:    albuterol (PROVENTIL) (2.5 MG/3ML) 0.083% nebulizer solution, Take 3 mLs (2.5 mg total) by nebulization every 6 (six) hours as needed for wheezing or shortness of breath. (Patient not taking: Reported on 04/18/2022), Disp: 75 mL, Rfl: 12   albuterol (VENTOLIN HFA) 108 (90 Base) MCG/ACT inhaler, Inhale into the lungs every 6 (six) hours as needed for wheezing or shortness of breath., Disp: , Rfl:    ibuprofen (ADVIL) 200 MG tablet, Take 400 mg by mouth every 6 (six) hours as needed for headache (pain). (Patient not taking: Reported on 04/18/2022), Disp: , Rfl:    Ibuprofen 200 MG CAPS, Take 200 mg by mouth every 8 (eight) hours as needed. Pain (Patient not taking: Reported on 04/18/2022), Disp: , Rfl:    lisinopril (ZESTRIL) 5 MG tablet, Take 5 mg by mouth daily., Disp: , Rfl:    naproxen (NAPROSYN) 500 MG tablet, Take 500 mg by mouth 2 (two) times daily with a meal. (Patient not taking: Reported on 04/18/2022), Disp: , Rfl:    Omega-3 Fatty Acids (FISH OIL PO), Take 1 capsule by mouth daily. (Patient not taking: Reported on 04/18/2022), Disp: ,  Rfl:    tretinoin (RETIN-A) 0.05 % cream, Apply 1 application topically at bedtime. Apply to face, Disp: , Rfl: 9  Social History   Tobacco Use  Smoking Status Never   Passive exposure: Never  Smokeless Tobacco Never    No Known Allergies Objective:  There were no vitals filed for this visit. There is no height or weight on file to calculate BMI. Constitutional Well developed. Well nourished.  Vascular Dorsalis pedis pulses palpable bilaterally. Posterior tibial pulses palpable bilaterally. Capillary refill normal to all digits.  No cyanosis or clubbing noted. Pedal hair growth normal.  Neurologic Normal speech. Oriented to person, place, and time. Epicritic sensation to light touch grossly present bilaterally.  Dermatologic Nails well groomed and normal in appearance. No open wounds. No skin lesions.  Orthopedic: No pain on palpation to the left Achilles tendon insertion.  No pain with dorsiflexion of the ankle joint no pain with plantarflexion of the ankle joint.  No deep intra-articular ankle pain noted no pain at the ATFL ligament, peroneal tendon, posterior tibial tendon.  Positive Silfverskiold test with gastrocnemius equinus.  Haglund's deformity noted   Radiographs: 3 views of skeletally mature adult left foot: None decreasing calcaneal inclination angle increasing talar declination angle anterior break in the cyma line.  Posterior and plantar heel spurring noted.  Pes planovalgus foot structure  noted.  No other bony abnormalities identified Assessment:   1. Left Achilles tendinitis   2. Pes cavus     Plan:  Patient was evaluated and treated and all questions answered.  Left Achilles tendinitis -I explained the patient the etiology of Achilles tendinitis and various treatment options were discussed.   -Clinically given the pain has improved patient will benefit from transition out of boot with a Tri-Lock ankle brace Tri-Lock ankle brace was dispensed.  She will also  benefit from orthotics with a little bit of a heel lift to help with tendinitis.  She states understand like to proceed with orthotics  Pes cavus -I explained to patient the etiology of pes cavus and relationship with Achilles tendinitis and various treatment options were discussed.  Given patient foot structure in the setting of Achilles tendinitis I believe patient will benefit from custom-made orthotics to help control the hindfoot motion support the arch of the foot and take the stress away from plantar fascial.  Patient agrees with the plan like to proceed with orthotics -Patient was casted for orthotics with heel lift  No follow-ups on file.   Left Achilles tendinitis Tri-Lock ankle brace for transition no pain.  Pes cavus orthotics

## 2022-11-29 ENCOUNTER — Ambulatory Visit: Payer: BC Managed Care – PPO | Admitting: Podiatry

## 2022-12-08 ENCOUNTER — Encounter: Payer: BC Managed Care – PPO | Admitting: Gastroenterology

## 2022-12-19 ENCOUNTER — Ambulatory Visit (AMBULATORY_SURGERY_CENTER): Payer: BC Managed Care – PPO

## 2022-12-19 VITALS — Ht 65.0 in | Wt 209.0 lb

## 2022-12-19 DIAGNOSIS — Z1211 Encounter for screening for malignant neoplasm of colon: Secondary | ICD-10-CM

## 2022-12-19 MED ORDER — NA SULFATE-K SULFATE-MG SULF 17.5-3.13-1.6 GM/177ML PO SOLN: 1.0000 | Freq: Once | ORAL | 0 refills | Status: AC

## 2022-12-19 NOTE — Progress Notes (Signed)

## 2022-12-28 ENCOUNTER — Encounter: Payer: Self-pay | Admitting: Gastroenterology

## 2022-12-29 ENCOUNTER — Ambulatory Visit (INDEPENDENT_AMBULATORY_CARE_PROVIDER_SITE_OTHER): Payer: BC Managed Care – PPO | Admitting: Podiatry

## 2022-12-29 DIAGNOSIS — M7662 Achilles tendinitis, left leg: Secondary | ICD-10-CM

## 2022-12-29 DIAGNOSIS — Q667 Congenital pes cavus, unspecified foot: Secondary | ICD-10-CM

## 2023-01-03 ENCOUNTER — Other Ambulatory Visit: Payer: BC Managed Care – PPO

## 2023-01-17 ENCOUNTER — Encounter: Payer: BC Managed Care – PPO | Admitting: Gastroenterology

## 2023-01-19 DIAGNOSIS — Z6836 Body mass index (BMI) 36.0-36.9, adult: Secondary | ICD-10-CM | POA: Insufficient documentation

## 2023-02-16 ENCOUNTER — Telehealth: Payer: Self-pay | Admitting: Podiatry

## 2023-02-16 NOTE — Telephone Encounter (Signed)
Left message for pt to call Monday to get scheduled to see Dr Effie Berkshire.Marland Kitchen

## 2023-02-16 NOTE — Telephone Encounter (Signed)
Pt called and is having more heel pain with the orthotics than before she got them. Should she come in to see the doctor or just come in for the orthotic dept to adjust?

## 2023-02-27 ENCOUNTER — Ambulatory Visit: Payer: BC Managed Care – PPO | Admitting: Podiatry

## 2023-03-01 ENCOUNTER — Ambulatory Visit: Payer: BC Managed Care – PPO | Admitting: Podiatry

## 2023-03-09 ENCOUNTER — Ambulatory Visit: Payer: BC Managed Care – PPO | Admitting: Podiatry

## 2023-03-09 DIAGNOSIS — M7662 Achilles tendinitis, left leg: Secondary | ICD-10-CM | POA: Diagnosis not present

## 2023-03-09 NOTE — Progress Notes (Signed)
Subjective:  Patient ID: Mary Mendez, female    DOB: [birth year],  MRN: [id number]  Chief Complaint  Patient presents with   Foot Orthotics    62 y.o. female presents with the above complaint.  Patient presents with follow-up of left Achilles tendinitis insertional pain.  She states she doing better.  She does not have any further pain.  She has been wearing her orthotics. f Systems: Negative except as noted in the HPI. Denies N/V/F/Ch.  Past Medical History:  Diagnosis Date   Asthma    Bronchitis    COVID-19 08/2021   Hypertension    Pneumonia     Current Outpatient Medications:    acetaminophen (TYLENOL) 500 MG tablet, Take 1,000 mg by mouth every 6 (six) hours as needed for mild pain., Disp: , Rfl:    albuterol (PROVENTIL) (2.5 MG/3ML) 0.083% nebulizer solution, Take 3 mLs (2.5 mg total) by nebulization every 6 (six) hours as needed for wheezing or shortness of breath., Disp: 75 mL, Rfl: 12   albuterol (VENTOLIN HFA) 108 (90 Base) MCG/ACT inhaler, Inhale into the lungs every 6 (six) hours as needed for wheezing or shortness of breath., Disp: , Rfl:    amLODipine (NORVASC) 5 MG tablet, Take 5 mg by mouth daily., Disp: , Rfl:    busPIRone (BUSPAR) 10 MG tablet, Take 10 mg by mouth 3 (three) times daily as needed., Disp: , Rfl:    ibuprofen (ADVIL) 200 MG tablet, Take 400 mg by mouth every 6 (six) hours as needed for headache (pain)., Disp: , Rfl:    Ibuprofen 200 MG CAPS, Take 200 mg by mouth every 8 (eight) hours as needed. Pain, Disp: , Rfl:    lactulose (CHRONULAC) 10 GM/15ML solution, SMARTSIG:15 Milliliter(s) By Mouth Daily PRN (Patient not taking: Reported on 12/19/2022), Disp: , Rfl:    lisinopril (ZESTRIL) 5 MG tablet, Take 5 mg by mouth daily. (Patient not taking: Reported on 12/19/2022), Disp: , Rfl:    naproxen (NAPROSYN) 500 MG tablet, Take 500 mg by mouth 2 (two) times daily with a meal. (Patient not taking: Reported on 12/19/2022), Disp: , Rfl:    Omega-3 Fatty Acids (FISH  OIL PO), Take 1 capsule by mouth daily., Disp: , Rfl:    tretinoin (RETIN-A) 0.05 % cream, Apply 1 application topically at bedtime. Apply to face, Disp: , Rfl: 9   tretinoin (RETIN-A) 0.1 % cream, Apply topically at bedtime as needed., Disp: , Rfl:   Social History   Tobacco Use  Smoking Status Never   Passive exposure: Never  Smokeless Tobacco Never    No Known Allergies Objective:  There were no vitals filed for this visit. There is no height or weight on file to calculate BMI. Constitutional Well developed. Well nourished.  Vascular Dorsalis pedis pulses palpable bilaterally. Posterior tibial pulses palpable bilaterally. Capillary refill normal to all digits.  No cyanosis or clubbing noted. Pedal hair growth normal.  Neurologic Normal speech. Oriented to person, place, and time. Epicritic sensation to light touch grossly present bilaterally.  Dermatologic Nails well groomed and normal in appearance. No open wounds. No skin lesions.  Orthopedic: No pain on palpation to the left Achilles tendon insertion.  No pain with dorsiflexion of the ankle joint no pain with plantarflexion of the ankle joint.  No deep intra-articular ankle pain noted no pain at the ATFL ligament, peroneal tendon, posterior tibial tendon.  Positive Silfverskiold test with gastrocnemius equinus.  Haglund's deformity noted   Radiographs: 3 views of skeletally mature adult  left foot: None decreasing calcaneal inclination angle increasing talar declination angle anterior break in the cyma line.  Posterior and plantar heel spurring noted.  Pes planovalgus foot structure noted.  No other bony abnormalities identified Assessment:   No diagnosis found.   Plan:  Patient was evaluated and treated and all questions answered.  Left Achilles tendinitis -I explained the patient the etiology of Achilles tendinitis and various treatment options were discussed.   -Clinically has completely resolved.  At this time I  discussed with the patient if any foot and ankle issues arise and she will come back and see me.  I encouraged importance of shoe gear modification orthotics management.  She states understanding  Pes cavus -I explained to patient the etiology of pes cavus and relationship with Achilles tendinitis and various treatment options were discussed.  Given patient foot structure in the setting of Achilles tendinitis I believe patient will benefit from custom-made orthotics to help control the hindfoot motion support the arch of the foot and take the stress away from plantar fascial.  Patient agrees with the plan like to proceed with orthotics -Orthotics are fitting well and functioning well.  No follow-ups on file.

## 2023-04-09 DIAGNOSIS — I8393 Asymptomatic varicose veins of bilateral lower extremities: Secondary | ICD-10-CM | POA: Insufficient documentation

## 2023-04-09 DIAGNOSIS — E66812 Obesity, class 2: Secondary | ICD-10-CM | POA: Diagnosis present

## 2023-04-09 DIAGNOSIS — J45909 Unspecified asthma, uncomplicated: Secondary | ICD-10-CM | POA: Diagnosis not present

## 2023-04-09 DIAGNOSIS — Z6835 Body mass index (BMI) 35.0-35.9, adult: Secondary | ICD-10-CM | POA: Diagnosis not present

## 2023-04-09 DIAGNOSIS — I10 Essential (primary) hypertension: Secondary | ICD-10-CM | POA: Diagnosis not present

## 2023-05-02 ENCOUNTER — Ambulatory Visit: Payer: BC Managed Care – PPO | Admitting: Podiatry

## 2023-06-25 DIAGNOSIS — J4540 Moderate persistent asthma, uncomplicated: Secondary | ICD-10-CM | POA: Diagnosis not present

## 2023-06-25 DIAGNOSIS — I10 Essential (primary) hypertension: Secondary | ICD-10-CM | POA: Diagnosis not present

## 2023-06-25 DIAGNOSIS — E669 Obesity, unspecified: Secondary | ICD-10-CM | POA: Diagnosis not present

## 2023-07-05 DIAGNOSIS — I10 Essential (primary) hypertension: Secondary | ICD-10-CM | POA: Diagnosis not present

## 2023-07-05 DIAGNOSIS — J069 Acute upper respiratory infection, unspecified: Secondary | ICD-10-CM | POA: Diagnosis not present

## 2023-07-05 DIAGNOSIS — J45909 Unspecified asthma, uncomplicated: Secondary | ICD-10-CM | POA: Diagnosis not present

## 2023-07-05 DIAGNOSIS — U071 COVID-19: Secondary | ICD-10-CM | POA: Diagnosis not present

## 2023-07-26 DIAGNOSIS — J45901 Unspecified asthma with (acute) exacerbation: Secondary | ICD-10-CM | POA: Diagnosis not present

## 2023-07-26 DIAGNOSIS — U071 COVID-19: Secondary | ICD-10-CM | POA: Diagnosis not present

## 2023-07-30 DIAGNOSIS — J069 Acute upper respiratory infection, unspecified: Secondary | ICD-10-CM | POA: Diagnosis not present

## 2023-07-30 DIAGNOSIS — J45901 Unspecified asthma with (acute) exacerbation: Secondary | ICD-10-CM | POA: Diagnosis not present

## 2023-07-30 NOTE — Progress Notes (Signed)
Seen by casting department

## 2023-08-08 DIAGNOSIS — Z114 Encounter for screening for human immunodeficiency virus [HIV]: Secondary | ICD-10-CM | POA: Diagnosis not present

## 2023-08-08 DIAGNOSIS — Z0000 Encounter for general adult medical examination without abnormal findings: Secondary | ICD-10-CM | POA: Diagnosis not present

## 2023-08-08 DIAGNOSIS — Z13220 Encounter for screening for lipoid disorders: Secondary | ICD-10-CM | POA: Diagnosis not present

## 2023-08-08 DIAGNOSIS — I10 Essential (primary) hypertension: Secondary | ICD-10-CM | POA: Diagnosis not present

## 2023-08-17 ENCOUNTER — Other Ambulatory Visit: Payer: Self-pay | Admitting: Physician Assistant

## 2023-08-17 ENCOUNTER — Ambulatory Visit
Admission: RE | Admit: 2023-08-17 | Discharge: 2023-08-17 | Disposition: A | Discharge status: Home / Self Care | Payer: BC Managed Care – PPO | Source: Ambulatory Visit | Attending: Physician Assistant | Admitting: Physician Assistant

## 2023-08-17 DIAGNOSIS — Z0000 Encounter for general adult medical examination without abnormal findings: Secondary | ICD-10-CM | POA: Diagnosis not present

## 2023-08-17 DIAGNOSIS — R7301 Impaired fasting glucose: Secondary | ICD-10-CM | POA: Diagnosis not present

## 2023-08-17 DIAGNOSIS — R059 Cough, unspecified: Secondary | ICD-10-CM

## 2023-08-17 DIAGNOSIS — J45909 Unspecified asthma, uncomplicated: Secondary | ICD-10-CM | POA: Diagnosis not present

## 2023-08-17 DIAGNOSIS — G4483 Primary cough headache: Secondary | ICD-10-CM | POA: Diagnosis not present

## 2023-08-17 DIAGNOSIS — E785 Hyperlipidemia, unspecified: Secondary | ICD-10-CM | POA: Diagnosis not present

## 2023-08-20 DIAGNOSIS — R059 Cough, unspecified: Secondary | ICD-10-CM | POA: Diagnosis not present

## 2023-08-20 DIAGNOSIS — I10 Essential (primary) hypertension: Secondary | ICD-10-CM | POA: Diagnosis not present

## 2023-08-22 DIAGNOSIS — L570 Actinic keratosis: Secondary | ICD-10-CM | POA: Diagnosis not present

## 2023-08-22 DIAGNOSIS — R208 Other disturbances of skin sensation: Secondary | ICD-10-CM | POA: Diagnosis not present

## 2023-08-22 DIAGNOSIS — L814 Other melanin hyperpigmentation: Secondary | ICD-10-CM | POA: Diagnosis not present

## 2023-08-22 DIAGNOSIS — D225 Melanocytic nevi of trunk: Secondary | ICD-10-CM | POA: Diagnosis not present

## 2023-08-22 DIAGNOSIS — L821 Other seborrheic keratosis: Secondary | ICD-10-CM | POA: Diagnosis not present

## 2023-08-22 DIAGNOSIS — L820 Inflamed seborrheic keratosis: Secondary | ICD-10-CM | POA: Diagnosis not present

## 2023-08-22 DIAGNOSIS — L988 Other specified disorders of the skin and subcutaneous tissue: Secondary | ICD-10-CM | POA: Diagnosis not present

## 2023-10-09 DIAGNOSIS — M1712 Unilateral primary osteoarthritis, left knee: Secondary | ICD-10-CM | POA: Diagnosis not present

## 2023-10-09 DIAGNOSIS — M170 Bilateral primary osteoarthritis of knee: Secondary | ICD-10-CM | POA: Diagnosis not present

## 2023-10-11 DIAGNOSIS — M25571 Pain in right ankle and joints of right foot: Secondary | ICD-10-CM | POA: Diagnosis not present

## 2023-11-16 DIAGNOSIS — J45909 Unspecified asthma, uncomplicated: Secondary | ICD-10-CM | POA: Diagnosis not present

## 2023-12-17 DIAGNOSIS — E785 Hyperlipidemia, unspecified: Secondary | ICD-10-CM | POA: Diagnosis not present

## 2023-12-17 DIAGNOSIS — R739 Hyperglycemia, unspecified: Secondary | ICD-10-CM | POA: Diagnosis not present

## 2023-12-18 DIAGNOSIS — M1712 Unilateral primary osteoarthritis, left knee: Secondary | ICD-10-CM | POA: Diagnosis not present

## 2023-12-18 DIAGNOSIS — M170 Bilateral primary osteoarthritis of knee: Secondary | ICD-10-CM | POA: Diagnosis not present

## 2023-12-24 DIAGNOSIS — E782 Mixed hyperlipidemia: Secondary | ICD-10-CM | POA: Diagnosis not present

## 2023-12-24 DIAGNOSIS — Z789 Other specified health status: Secondary | ICD-10-CM | POA: Diagnosis not present

## 2024-02-15 DIAGNOSIS — R109 Unspecified abdominal pain: Secondary | ICD-10-CM | POA: Diagnosis not present

## 2024-02-15 DIAGNOSIS — N3000 Acute cystitis without hematuria: Secondary | ICD-10-CM | POA: Diagnosis not present

## 2024-04-01 DIAGNOSIS — G8929 Other chronic pain: Secondary | ICD-10-CM | POA: Diagnosis not present

## 2024-04-01 DIAGNOSIS — I10 Essential (primary) hypertension: Secondary | ICD-10-CM | POA: Diagnosis not present

## 2024-04-01 DIAGNOSIS — J4540 Moderate persistent asthma, uncomplicated: Secondary | ICD-10-CM | POA: Diagnosis not present

## 2024-04-01 DIAGNOSIS — M25569 Pain in unspecified knee: Secondary | ICD-10-CM | POA: Diagnosis not present

## 2024-05-27 DIAGNOSIS — Z1331 Encounter for screening for depression: Secondary | ICD-10-CM | POA: Diagnosis not present

## 2024-05-27 DIAGNOSIS — Z1231 Encounter for screening mammogram for malignant neoplasm of breast: Secondary | ICD-10-CM | POA: Diagnosis not present

## 2024-05-27 DIAGNOSIS — Z01419 Encounter for gynecological examination (general) (routine) without abnormal findings: Secondary | ICD-10-CM | POA: Diagnosis not present

## 2024-05-29 ENCOUNTER — Other Ambulatory Visit: Payer: Self-pay | Admitting: Obstetrics

## 2024-05-29 DIAGNOSIS — R928 Other abnormal and inconclusive findings on diagnostic imaging of breast: Secondary | ICD-10-CM

## 2024-06-04 ENCOUNTER — Ambulatory Visit
Admission: RE | Admit: 2024-06-04 | Discharge: 2024-06-04 | Disposition: A | Discharge status: Home / Self Care | Source: Ambulatory Visit | Attending: Obstetrics | Admitting: Obstetrics

## 2024-06-04 ENCOUNTER — Other Ambulatory Visit: Payer: Self-pay | Admitting: Obstetrics

## 2024-06-04 DIAGNOSIS — C50212 Malignant neoplasm of upper-inner quadrant of left female breast: Secondary | ICD-10-CM | POA: Diagnosis not present

## 2024-06-04 DIAGNOSIS — R921 Mammographic calcification found on diagnostic imaging of breast: Secondary | ICD-10-CM

## 2024-06-04 DIAGNOSIS — N6320 Unspecified lump in the left breast, unspecified quadrant: Secondary | ICD-10-CM

## 2024-06-04 DIAGNOSIS — N6325 Unspecified lump in the left breast, overlapping quadrants: Secondary | ICD-10-CM | POA: Diagnosis not present

## 2024-06-04 DIAGNOSIS — R92322 Mammographic fibroglandular density, left breast: Secondary | ICD-10-CM | POA: Diagnosis not present

## 2024-06-04 DIAGNOSIS — R928 Other abnormal and inconclusive findings on diagnostic imaging of breast: Secondary | ICD-10-CM | POA: Diagnosis not present

## 2024-06-04 DIAGNOSIS — Z170 Estrogen receptor positive status [ER+]: Secondary | ICD-10-CM | POA: Diagnosis not present

## 2024-06-04 DIAGNOSIS — N6322 Unspecified lump in the left breast, upper inner quadrant: Secondary | ICD-10-CM | POA: Diagnosis not present

## 2024-06-04 DIAGNOSIS — D0512 Intraductal carcinoma in situ of left breast: Secondary | ICD-10-CM | POA: Diagnosis not present

## 2024-06-04 HISTORY — PX: BREAST BIOPSY: SHX20

## 2024-06-05 ENCOUNTER — Telehealth: Payer: Self-pay | Admitting: *Deleted

## 2024-06-05 LAB — SURGICAL PATHOLOGY

## 2024-06-05 NOTE — Telephone Encounter (Signed)
 Unable to leave a voicemail or anything for upcoming appointment will send packet through mail

## 2024-06-05 NOTE — Telephone Encounter (Signed)
 Spoke to patient to confirm upcoming morning [facility] clinic appointment on 8/13, paperwork will be sent via email  Gave location and time, also informed patient that the surgeon's office would be calling as well to get information from them similar to the packet that they will be receiving so make sure to do both.  Reminded patient that all providers will be coming to the clinic to see them HERE and if they had any questions to not hesitate to reach back out to myself or their navigators.

## 2024-06-09 ENCOUNTER — Encounter: Payer: Self-pay | Admitting: *Deleted

## 2024-06-09 DIAGNOSIS — C50212 Malignant neoplasm of upper-inner quadrant of left female breast: Secondary | ICD-10-CM | POA: Insufficient documentation

## 2024-06-10 NOTE — Progress Notes (Addendum)
 Radiation Oncology         [phone] ________________________________  Initial Outpatient Consultation  Name: Mary Mendez MRN: [id number]  Date: 06/11/2024  DOB: 1960/11/12  CC:Cristopher Suzen HERO, NP  Cristopher Suzen HERO, NP   REFERRING PHYSICIAN: Cristopher Suzen HERO, NP  DIAGNOSIS:    ICD-10-CM   1. Malignant neoplasm of upper-inner quadrant of left breast in female, estrogen receptor positive (HCC)  C50.212    Z17.0        Cancer Staging  Malignant neoplasm of upper-inner quadrant of left breast in female, estrogen receptor positive (HCC) Staging form: Breast, AJCC 8th Edition - Clinical stage from 06/11/2024: Stage IA (cT1b, cN0, cM0, G2, ER+, PR+, HER2-) - Signed by Loretha Ash, MD on 06/11/2024 Stage prefix: Initial diagnosis Histologic grading system: 3 grade system Laterality: Left Staged by: Pathologist and managing physician Stage used in treatment planning: Yes National guidelines used in treatment planning: Yes Type of national guideline used in treatment planning: NCCN   Stage IA (cT1b, N0, M0) 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 female who presented with breast abnormality on the following imaging: bilateral screening mammogram on the date of 05/27/24. No symptoms, if any, were reported at that time. She then presented for a diagnostic left breast mammogram and left breast ultrasound on 06/04/24 which demonstrated: a 7 mm mass in the 10 o'clock left breast located 8 cmfn; a vague 4 mm shadowing lesion in the 10:30 o'clock left breast located 10 cmfn; and a 2.5 cm group of suspicious calcifications in the medial left breast. No abnormal left axillary lymph nodes were demonstrated.   Biopsies collected on date of 06/04/24 are detailed as follows:  -- Biopsy of the upper inner left  breast mass showed grade 2 invasive mammary/ductal carcinoma measuring 5.5 mm in the greatest linear extent of the sample with calcifications present.  ER status: 100% positive with strong staining intensity; PR status 20% positive with moderate staining intensity; Proliferation marker Ki67 at 60%; Her2 status negative; Grade 2. No lymph nodes were examined.  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   Patient reports to be doing well overall today. She denies any breast pain, palpable masses, skin changes, or issues with left shoulder ROM.   PREVIOUS RADIATION THERAPY: No  PAST MEDICAL HISTORY:  has a past medical history of Asthma, Bronchitis, COVID-19 (08/2021), Hypertension, and Pneumonia.    PAST SURGICAL HISTORY: Past Surgical History:  Procedure Laterality Date   ABDOMINAL HYSTERECTOMY  2010   and left ovaries;   BREAST BIOPSY     BREAST BIOPSY Left 06/04/2024   MM LT BREAST BX W LOC DEV 1ST LESION IMAGE BX SPEC STEREO GUIDE 06/04/2024 [facility]   BREAST BIOPSY Left 06/04/2024   MM LT BREAST BX W LOC DEV EA AD LESION IMG BX SPEC STEREO GUIDE 06/04/2024 [facility]   BREAST EXCISIONAL BIOPSY Left    BREAST SURGERY     CHOLECYSTECTOMY N/A 02/18/2022   Procedure: LAPAROSCOPIC CHOLECYSTECTOMY;  Surgeon: Signe Mitzie DELENA, MD;  Location: [facility] OR;  Service: General;  Laterality: N/A;   HYSTEROTOMY      FAMILY HISTORY: family history includes Asthma in her paternal uncle; Breast cancer (age of onset: 44)  in her mother; Emphysema in her father; Leukemia (age of onset: 23 - 58) in her maternal aunt.  SOCIAL HISTORY:  reports that she has never smoked. She has never been exposed to tobacco smoke. She has never used smokeless tobacco. She reports current alcohol use. She reports that she does not use drugs. 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   ALLERGIES: Patient has no known allergies.  MEDICATIONS:  Current Outpatient Medications  Medication Sig Dispense Refill   acetaminophen  (TYLENOL ) 500 MG tablet Take 1,000 mg by mouth every 6 (six) hours as needed for mild pain.     albuterol  (PROVENTIL ) (2.5 MG/3ML) 0.083% nebulizer solution Take 3 mLs (2.5 mg total) by nebulization every 6 (six) hours as needed for wheezing or shortness of breath. 75 mL 12   albuterol  (VENTOLIN  HFA) 108 (90 Base) MCG/ACT inhaler Inhale into the lungs every 6 (six) hours as needed for wheezing or shortness of breath.     amLODipine (NORVASC) 5 MG tablet Take 5 mg by mouth daily.     busPIRone (BUSPAR) 10 MG tablet Take 10 mg by mouth 3 (three) times daily as needed.     ibuprofen (ADVIL) 200 MG tablet Take 400 mg by mouth every 6 (six) hours as needed for headache (pain).     Ibuprofen 200 MG CAPS Take 200 mg by mouth every 8 (eight) hours as needed. Pain     lactulose (CHRONULAC) 10 GM/15ML solution SMARTSIG:15 Milliliter(s) By Mouth Daily PRN (Patient not taking: Reported on 12/19/2022)     lisinopril (ZESTRIL) 5 MG tablet Take 5 mg by mouth daily. (Patient not taking: Reported on 12/19/2022)     naproxen (NAPROSYN) 500 MG tablet Take 500 mg by mouth 2 (two) times daily with a meal. (Patient not taking: Reported on 12/19/2022)     Omega-3 Fatty Acids (FISH OIL PO) Take 1 capsule by mouth daily.     tretinoin (RETIN-A) 0.05 % cream Apply 1 application topically at bedtime. Apply to face  9   tretinoin (RETIN-A) 0.1 % cream Apply topically at bedtime as needed.     No current facility-administered medications for this encounter.    REVIEW OF SYSTEMS: As above in HPI.   PHYSICAL EXAM:   General: Alert and oriented, in no acute distress HEENT: Head is normocephalic. Extraocular movements are intact.  Heart: Regular in rate and rhythm with no murmurs, rubs, or gallops. Chest: Clear to auscultation bilaterally, with no  rhonchi, wheezes, or rales. Abdomen: Soft, nontender, nondistended, with no rigidity or guarding. Extremities: No cyanosis or edema. Skin: No concerning lesions. Musculoskeletal: symmetric strength and muscle tone throughout. Neurologic: Cranial nerves II through XII are grossly intact. No obvious focalities. Speech is fluent. Coordination is intact. Psychiatric: Judgment and insight are intact. Affect is appropriate. Breasts: Biopsy lesion with surrounding bruising and associated indurated tissue palpated beneath. No other palpable masses appreciated in the breasts or axillae.    ECOG = 0  0 - Asymptomatic (Fully active, able to carry on all predisease activities without restriction)  1 - Symptomatic but completely ambulatory (Restricted in physically strenuous activity but ambulatory and able to carry out work of a light or sedentary nature. For example, light housework, office work)  2 - Symptomatic, <50% in bed during the day (Ambulatory and capable of all self care but unable to carry out any work activities. Up and about more than 50% of waking hours)  3 - Symptomatic, >  50% in bed, but not bedbound (Capable of only limited self-care, confined to bed or chair 50% or more of waking hours)  4 - Bedbound (Completely disabled. Cannot carry on any self-care. Totally confined to bed or chair)  5 - Death   Raylene MM, Creech RH, Tormey DC, et al. [phone]). Toxicity and response criteria of the [facility]. Am. DOROTHA Bridges. Oncol. 5 (6): 649-55   LABORATORY DATA:   CBC    Component Value Date/Time   WBC 9.6 06/11/2024 1234   WBC 8.4 02/17/2022 2333   RBC 5.16 (H) 06/11/2024 1234   HGB 15.0 06/11/2024 1234   HCT 43.9 06/11/2024 1234   PLT 284 06/11/2024 1234   MCV 85.1 06/11/2024 1234   MCH 29.1 06/11/2024 1234   MCHC 34.2 06/11/2024 1234   RDW 14.8 06/11/2024 1234   LYMPHSABS 2.8 06/11/2024 1234   MONOABS 0.7 06/11/2024 1234   EOSABS 0.2 06/11/2024 1234    BASOSABS 0.1 06/11/2024 1234    CMP     Component Value Date/Time   NA 140 06/11/2024 1234   K 4.3 06/11/2024 1234   CL 105 06/11/2024 1234   CO2 28 06/11/2024 1234   GLUCOSE 87 06/11/2024 1234   BUN 15 06/11/2024 1234   CREATININE 0.74 06/11/2024 1234   CALCIUM 9.7 06/11/2024 1234   PROT 7.5 06/11/2024 1234   ALBUMIN 4.6 06/11/2024 1234   AST 16 06/11/2024 1234   ALT 19 06/11/2024 1234   ALKPHOS 87 06/11/2024 1234   BILITOT 0.6 06/11/2024 1234   GFRNONAA >60 06/11/2024 1234      RADIOGRAPHY: MM LT BREAST BX W LOC DEV 1ST LESION IMAGE BX SPEC STEREO GUIDE Addendum Date: 06/05/2024 ADDENDUM REPORT: 06/05/2024 17:14 ADDENDUM: PATHOLOGY revealed: Site 1. Breast, left, needle core biopsy, mass with calcifications 5 mm upper inner quadrant (ribbon clip): - INVASIVE MAMMARY CARCINOMA. OVERALL GRADE: 2 - LYMPHOVASCULAR INVASION: NOT IDENTIFIED - CANCER LENGTH: 5.5 MM - CALCIFICATIONS: PRESENT. Pathology results are CONCORDANT with imaging findings, per Dr. Aliene Mir. PATHOLOGY revealed: Site 2. Breast, left, needle core biopsy, 2.5 cm group upper inner quadrant (x clip) : DUCTAL CARCINOMA IN SITU, INTERMEDIATE GRADE, SOLID TYPE. NECROSIS: PRESENT - CALCIFICATIONS: PRESENT. DCIS LENGTH: 1.0 CM / 10 MM Pathology results are CONCORDANT with imaging findings, per Dr. Aliene Mir. Pathology results and recommendations below were discussed with patient by telephone on 06/05/24 by Rock Hover RN. Patient reported biopsy site within normal limits with slight tenderness at the site. Post biopsy care instructions were reviewed, questions were answered and my direct phone number was provided to patient. Patient was instructed to call [facility] if any concerns or questions arise related to the biopsy. RECOMMENDATION: 1. Surgical and oncological consultation. Patient was referred to the [facility] at [facility] with appointment  on 06/11/2024. 2. Consider pretreatment bilateral staging breast MRI with and without contrast to assess extent of breast disease. Pathology results reported by Rock Hover RN on 06/05/2024. Electronically Signed   By: Aliene Lloyd M.D.   On: 06/05/2024 17:14   Result Date: 06/05/2024 CLINICAL DATA:  63 year old woman with suspicious 0.5 cm LEFT breast mass with calcifications and 2.5 cm group of LEFT breast calcifications presents for stereotactic guided core needle biopsies. EXAM: LEFT BREAST STEREOTACTIC CORE NEEDLE BIOPSY (x2) COMPARISON:  Previous exam(s). FINDINGS: The patient and I discussed the procedure of stereotactic-guided biopsy including benefits and alternatives. We discussed the high likelihood of  a successful procedure. We discussed the risks of the procedure including infection, bleeding, tissue injury, clip migration, and inadequate sampling. Informed written consent was given. The usual time out protocol was performed immediately prior to the procedure. SITE 1: 0.5 CM LEFT BREAST MASS WITH CALCIFICATIONS Using sterile technique and 1% Lidocaine  as local anesthetic, under stereotactic guidance, a 9 gauge vacuum assisted device was used to perform core needle biopsy of the 0.5 cm LEFT breast mass with calcifications using a superior approach. Specimen radiograph was performed showing calcifications. Specimens with calcifications are identified for pathology. Lesion quadrant: Upper inner quadrant At the conclusion of the procedure, ribbon shaped tissue marker clip was deployed into the biopsy cavity. Follow-up 2-view mammogram was performed and dictated separately. __________________________________________________________________________________________________ SITE 2: 2.5 CM GROUP OF LEFT BREAST CALCIFICATIONS Using sterile technique and 1% Lidocaine  as local anesthetic, under stereotactic guidance, a 9 gauge vacuum assisted device was used to perform core needle biopsy of the 2.5 cm group of LEFT  breast calcifications using a superior approach. Specimen radiograph was performed showing calcifications. Specimens with calcifications are identified for pathology. Lesion quadrant: Upper inner quadrant At the conclusion of the procedure, X shaped tissue marker clip was deployed into the biopsy cavity. Follow-up 2-view mammogram was performed and dictated separately. IMPRESSION: Stereotactic-guided biopsy of 0.5 cm LEFT breast mass with calcifications and 2.5 cm group of LEFT breast calcifications. No apparent complications. Electronically Signed: By: Aliene Lloyd M.D. On: 06/04/2024 16:02   MM LT BREAST BX W LOC DEV EA AD LESION IMG BX SPEC STEREO GUIDE Addendum Date: 06/05/2024 ADDENDUM REPORT: 06/05/2024 17:14 ADDENDUM: PATHOLOGY revealed: Site 1. Breast, left, needle core biopsy, mass with calcifications 5 mm upper inner quadrant (ribbon clip): - INVASIVE MAMMARY CARCINOMA. OVERALL GRADE: 2 - LYMPHOVASCULAR INVASION: NOT IDENTIFIED - CANCER LENGTH: 5.5 MM - CALCIFICATIONS: PRESENT. Pathology results are CONCORDANT with imaging findings, per Dr. Aliene Mir. PATHOLOGY revealed: Site 2. Breast, left, needle core biopsy, 2.5 cm group upper inner quadrant (x clip) : DUCTAL CARCINOMA IN SITU, INTERMEDIATE GRADE, SOLID TYPE. NECROSIS: PRESENT - CALCIFICATIONS: PRESENT. DCIS LENGTH: 1.0 CM / 10 MM Pathology results are CONCORDANT with imaging findings, per Dr. Aliene Mir. Pathology results and recommendations below were discussed with patient by telephone on 06/05/24 by Rock Hover RN. Patient reported biopsy site within normal limits with slight tenderness at the site. Post biopsy care instructions were reviewed, questions were answered and my direct phone number was provided to patient. Patient was instructed to call [facility] if any concerns or questions arise related to the biopsy. RECOMMENDATION: 1. Surgical and oncological consultation. Patient was referred to the [facility] at [facility] with appointment on 06/11/2024. 2. Consider pretreatment bilateral staging breast MRI with and without contrast to assess extent of breast disease. Pathology results reported by Rock Hover RN on 06/05/2024. Electronically Signed   By: Aliene Lloyd M.D.   On: 06/05/2024 17:14   Result Date: 06/05/2024 CLINICAL DATA:  63 year old woman with suspicious 0.5 cm LEFT breast mass with calcifications and 2.5 cm group of LEFT breast calcifications presents for stereotactic guided core needle biopsies. EXAM: LEFT BREAST STEREOTACTIC CORE NEEDLE BIOPSY (x2) COMPARISON:  Previous exam(s). FINDINGS: The patient and I discussed the procedure of stereotactic-guided biopsy including benefits and alternatives. We discussed the high likelihood of a successful procedure. We discussed the risks of the procedure including infection, bleeding, tissue injury, clip migration, and inadequate sampling. Informed written consent  was given. The usual time out protocol was performed immediately prior to the procedure. SITE 1: 0.5 CM LEFT BREAST MASS WITH CALCIFICATIONS Using sterile technique and 1% Lidocaine  as local anesthetic, under stereotactic guidance, a 9 gauge vacuum assisted device was used to perform core needle biopsy of the 0.5 cm LEFT breast mass with calcifications using a superior approach. Specimen radiograph was performed showing calcifications. Specimens with calcifications are identified for pathology. Lesion quadrant: Upper inner quadrant At the conclusion of the procedure, ribbon shaped tissue marker clip was deployed into the biopsy cavity. Follow-up 2-view mammogram was performed and dictated separately. __________________________________________________________________________________________________ SITE 2: 2.5 CM GROUP OF LEFT BREAST CALCIFICATIONS Using sterile technique and 1% Lidocaine  as local anesthetic, under stereotactic guidance, a 9 gauge vacuum  assisted device was used to perform core needle biopsy of the 2.5 cm group of LEFT breast calcifications using a superior approach. Specimen radiograph was performed showing calcifications. Specimens with calcifications are identified for pathology. Lesion quadrant: Upper inner quadrant At the conclusion of the procedure, X shaped tissue marker clip was deployed into the biopsy cavity. Follow-up 2-view mammogram was performed and dictated separately. IMPRESSION: Stereotactic-guided biopsy of 0.5 cm LEFT breast mass with calcifications and 2.5 cm group of LEFT breast calcifications. No apparent complications. Electronically Signed: By: Aliene Lloyd M.D. On: 06/04/2024 16:02   MM CLIP PLACEMENT LEFT Result Date: 06/04/2024 CLINICAL DATA:  Status post stereotactic guided biopsy of LEFT breast mass with calcifications and additional group of calcifications. EXAM: 3D DIAGNOSTIC LEFT MAMMOGRAM POST STEREOTACTIC BIOPSY (x2) COMPARISON:  Previous exam(s). ACR Breast Density Category b: There are scattered areas of fibroglandular density. FINDINGS: 3D Mammographic images were obtained following stereotactic guided biopsy of LEFT breast mass and calcification. The biopsy marking clips are in expected position at the site of biopsy. IMPRESSION: Appropriate positioning of the ribbon and X shaped biopsy marking clips at the sites of biopsy in the upper inner LEFT breast. Final Assessment: Post Procedure Mammograms for Marker Placement Electronically Signed   By: Aliene Lloyd M.D.   On: 06/04/2024 16:03   MM 3D DIAGNOSTIC MAMMOGRAM UNILATERAL LEFT BREAST Result Date: 06/04/2024 CLINICAL DATA:  Screening recall for a possible left breast asymmetry. EXAM: DIGITAL DIAGNOSTIC UNILATERAL LEFT MAMMOGRAM WITH TOMOSYNTHESIS AND CAD; ULTRASOUND LEFT BREAST LIMITED TECHNIQUE: Left digital diagnostic mammography and breast tomosynthesis was performed. The images were evaluated with computer-aided detection. ; Targeted ultrasound  examination of the left breast was performed. COMPARISON:  Previous exam(s). ACR Breast Density Category b: There are scattered areas of fibroglandular density. FINDINGS: On spot compression imaging, possible asymmetry noted on the current screening exam appears as a small irregular mass or focal asymmetry with a few associated small round calcifications, lying in the upper inner breast, middle depth, measuring 5 mm in size. Also in the medial left breast, projecting at 9-10 o'clock, are coarse heterogeneous calcifications spanning approximately 2.1 x 1.2 x 2.5 cm. There is no associated mass or distortion. Some demonstrate linear forms. On physical exam, no mass is palpated in the medial to upper inner left breast. Targeted ultrasound is performed, showing a hypoechoic mass at 10 o'clock, 8 cm the nipple, middle depth, with echogenic foci within the mass consistent with calcifications. Margins are partly circumscribed and partly ill-defined. The mass measures 7 x 3 x 4 mm. This may correspond to the mammographic mass/focal asymmetry. In addition, the left breast at approximately 10:30 o'clock, 10 cm the nipple, there is a vague shadowing 4 x 3 x 4 mm  lesion with no internal blood flow on color Doppler analysis and with adjacent increased echogenicity and posterior acoustic shadowing. Sonographic imaging of the left axilla demonstrates normal lymph nodes. No enlarged or abnormal lymph nodes. IMPRESSION: 1. Suspicious findings. The suggested screening mammographic asymmetry is consistent with a small irregular mass/focal asymmetry with a few associated calcifications projecting in the upper inner left breast measuring 5 mm mammographically. This may correspond to the hypoechoic mass seen on sonography at 10 o'clock, 8 cm the nipple or to the second sonographic abnormality, at 10:30 o'clock, 10 cm from the nipple. This latter finding is smaller and more subtle. Given the uncertainty of which sonographic finding may be  the correlate to the mammographic abnormality, stereotactic biopsy of the mammographic finding is recommended. 2. 2.5 cm group of suspicious calcifications in the medial left breast, anterior to the above described small irregular mass/focal asymmetry. Stereotactic core needle biopsy is recommended. RECOMMENDATION: 1. Stereotactic core needle biopsy of the left breast x2. Recommend targeting the small irregular mass/focal asymmetry in the upper inner left breast as well as the 2.5 cm group of calcifications in the more anterior, medial left breast. I have discussed the findings and recommendations with the patient. If applicable, a reminder letter will be sent to the patient regarding the next appointment. BI-RADS CATEGORY  4: Suspicious. Electronically Signed   By: Alm Parkins M.D.   On: 06/04/2024 11:31   US  LIMITED ULTRASOUND INCLUDING AXILLA LEFT BREAST  Result Date: 06/04/2024 CLINICAL DATA:  Screening recall for a possible left breast asymmetry. EXAM: DIGITAL DIAGNOSTIC UNILATERAL LEFT MAMMOGRAM WITH TOMOSYNTHESIS AND CAD; ULTRASOUND LEFT BREAST LIMITED TECHNIQUE: Left digital diagnostic mammography and breast tomosynthesis was performed. The images were evaluated with computer-aided detection. ; Targeted ultrasound examination of the left breast was performed. COMPARISON:  Previous exam(s). ACR Breast Density Category b: There are scattered areas of fibroglandular density. FINDINGS: On spot compression imaging, possible asymmetry noted on the current screening exam appears as a small irregular mass or focal asymmetry with a few associated small round calcifications, lying in the upper inner breast, middle depth, measuring 5 mm in size. Also in the medial left breast, projecting at 9-10 o'clock, are coarse heterogeneous calcifications spanning approximately 2.1 x 1.2 x 2.5 cm. There is no associated mass or distortion. Some demonstrate linear forms. On physical exam, no mass is palpated in the medial to  upper inner left breast. Targeted ultrasound is performed, showing a hypoechoic mass at 10 o'clock, 8 cm the nipple, middle depth, with echogenic foci within the mass consistent with calcifications. Margins are partly circumscribed and partly ill-defined. The mass measures 7 x 3 x 4 mm. This may correspond to the mammographic mass/focal asymmetry. In addition, the left breast at approximately 10:30 o'clock, 10 cm the nipple, there is a vague shadowing 4 x 3 x 4 mm lesion with no internal blood flow on color Doppler analysis and with adjacent increased echogenicity and posterior acoustic shadowing. Sonographic imaging of the left axilla demonstrates normal lymph nodes. No enlarged or abnormal lymph nodes. IMPRESSION: 1. Suspicious findings. The suggested screening mammographic asymmetry is consistent with a small irregular mass/focal asymmetry with a few associated calcifications projecting in the upper inner left breast measuring 5 mm mammographically. This may correspond to the hypoechoic mass seen on sonography at 10 o'clock, 8 cm the nipple or to the second sonographic abnormality, at 10:30 o'clock, 10 cm from the nipple. This latter finding is smaller and more subtle. Given the uncertainty of which  sonographic finding may be the correlate to the mammographic abnormality, stereotactic biopsy of the mammographic finding is recommended. 2. 2.5 cm group of suspicious calcifications in the medial left breast, anterior to the above described small irregular mass/focal asymmetry. Stereotactic core needle biopsy is recommended. RECOMMENDATION: 1. Stereotactic core needle biopsy of the left breast x2. Recommend targeting the small irregular mass/focal asymmetry in the upper inner left breast as well as the 2.5 cm group of calcifications in the more anterior, medial left breast. I have discussed the findings and recommendations with the patient. If applicable, a reminder letter will be sent to the patient regarding the  next appointment. BI-RADS CATEGORY  4: Suspicious. Electronically Signed   By: Alm Parkins M.D.   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 We discussed the risks, benefits, and side effects of radiotherapy. I recommend radiotherapy to the left breast to reduce her risk of locoregional recurrence by 2/3.  We discussed that radiation would take approximately 4 weeks to complete and that I would give the patient a few weeks to heal following surgery before starting treatment planning. If chemotherapy were to be given, this would precede radiotherapy. We spoke about acute effects including skin irritation and fatigue as well as much less common late effects including internal organ injury or irritation. We spoke about the latest technology that is used to minimize the risk of late effects for patients undergoing radiotherapy to the breast or chest wall. No guarantees of treatment were given. The patient is enthusiastic about proceeding with treatment. I look forward to participating in the patient's care.  I will await her referral back to me for postoperative follow-up and eventual CT simulation/treatment planning.  Of note, patient is interested in a plastic surgery referral for consideration of breast reduction. If she decides to proceed with oncologic breast reduction, we may consider a 6 week course of radiation treatment to minimize the risk of tissue damage and preserve cosmetic outcomes.   On date of service, in total, we spent 60 minutes on this encounter. Patient was seen in person. Note signed after encounter date; minutes pertain to date of service, only.    __________________________________________    Leeroy Due, PA-C   Lauraine Golden, MD    [facility]  Radiation Oncology Direct Dial: [phone]  Fax:  [phone] [email or web address]    This document serves as a record of services personally performed by Lauraine Golden, MD. It was created on her behalf by Dorthy Fuse, a trained medical scribe. The creation of this record is based on the scribe's personal observations and the provider's statements to them. This document has been checked and approved by the attending provider.

## 2024-06-11 ENCOUNTER — Ambulatory Visit
Admission: RE | Admit: 2024-06-11 | Discharge: 2024-06-11 | Disposition: A | Discharge status: Home / Self Care | Source: Ambulatory Visit | Attending: Radiation Oncology | Admitting: Radiation Oncology

## 2024-06-11 ENCOUNTER — Inpatient Hospital Stay: Attending: Hematology and Oncology

## 2024-06-11 ENCOUNTER — Inpatient Hospital Stay: Admitting: Hematology and Oncology

## 2024-06-11 ENCOUNTER — Ambulatory Visit: Admitting: Licensed Clinical Social Worker

## 2024-06-11 ENCOUNTER — Encounter: Payer: Self-pay | Admitting: *Deleted

## 2024-06-11 ENCOUNTER — Inpatient Hospital Stay: Admitting: Genetic Counselor

## 2024-06-11 ENCOUNTER — Encounter: Payer: Self-pay | Admitting: Genetic Counselor

## 2024-06-11 VITALS — BP 118/69 | HR 87 | Temp 98.6°F | Resp 18 | Ht 65.0 in | Wt 219.9 lb

## 2024-06-11 DIAGNOSIS — C50212 Malignant neoplasm of upper-inner quadrant of left female breast: Secondary | ICD-10-CM | POA: Diagnosis present

## 2024-06-11 DIAGNOSIS — Z170 Estrogen receptor positive status [ER+]: Secondary | ICD-10-CM

## 2024-06-11 DIAGNOSIS — Z806 Family history of leukemia: Secondary | ICD-10-CM | POA: Insufficient documentation

## 2024-06-11 DIAGNOSIS — Z1721 Progesterone receptor positive status: Secondary | ICD-10-CM

## 2024-06-11 DIAGNOSIS — Z803 Family history of malignant neoplasm of breast: Secondary | ICD-10-CM

## 2024-06-11 DIAGNOSIS — Z1732 Human epidermal growth factor receptor 2 negative status: Secondary | ICD-10-CM | POA: Insufficient documentation

## 2024-06-11 DIAGNOSIS — Z90710 Acquired absence of both cervix and uterus: Secondary | ICD-10-CM

## 2024-06-11 DIAGNOSIS — Z853 Personal history of malignant neoplasm of breast: Secondary | ICD-10-CM | POA: Diagnosis not present

## 2024-06-11 DIAGNOSIS — C50912 Malignant neoplasm of unspecified site of left female breast: Secondary | ICD-10-CM | POA: Diagnosis not present

## 2024-06-11 DIAGNOSIS — D0512 Intraductal carcinoma in situ of left breast: Secondary | ICD-10-CM | POA: Diagnosis not present

## 2024-06-11 LAB — CBC WITH DIFFERENTIAL (CANCER CENTER ONLY)
Abs Immature Granulocytes: 0.03 K/uL (ref 0.00–0.07)
Basophils Absolute: 0.1 K/uL (ref 0.0–0.1)
Basophils Relative: 1 %
Eosinophils Absolute: 0.2 K/uL (ref 0.0–0.5)
Eosinophils Relative: 2 %
HCT: 43.9 % (ref 36.0–46.0)
Hemoglobin: 15 g/dL (ref 12.0–15.0)
Immature Granulocytes: 0 %
Lymphocytes Relative: 29 %
Lymphs Abs: 2.8 K/uL (ref 0.7–4.0)
MCH: 29.1 pg (ref 26.0–34.0)
MCHC: 34.2 g/dL (ref 30.0–36.0)
MCV: 85.1 fL (ref 80.0–100.0)
Monocytes Absolute: 0.7 K/uL (ref 0.1–1.0)
Monocytes Relative: 7 %
Neutro Abs: 5.8 K/uL (ref 1.7–7.7)
Neutrophils Relative %: 61 %
Platelet Count: 284 K/uL (ref 150–400)
RBC: 5.16 MIL/uL — ABNORMAL HIGH (ref 3.87–5.11)
RDW: 14.8 % (ref 11.5–15.5)
WBC Count: 9.6 K/uL (ref 4.0–10.5)
nRBC: 0 % (ref 0.0–0.2)

## 2024-06-11 LAB — RESEARCH LABS

## 2024-06-11 LAB — CMP (CANCER CENTER ONLY)
ALT: 19 U/L (ref 0–44)
AST: 16 U/L (ref 15–41)
Albumin: 4.6 g/dL (ref 3.5–5.0)
Alkaline Phosphatase: 87 U/L (ref 38–126)
Anion gap: 7 (ref 5–15)
BUN: 15 mg/dL (ref 8–23)
CO2: 28 mmol/L (ref 22–32)
Calcium: 9.7 mg/dL (ref 8.9–10.3)
Chloride: 105 mmol/L (ref 98–111)
Creatinine: 0.74 mg/dL (ref 0.44–1.00)
GFR, Estimated: >60 mL/min (ref >60)
Glucose, Bld: 87 mg/dL (ref 70–99)
Potassium: 4.3 mmol/L (ref 3.5–5.1)
Sodium: 140 mmol/L (ref 135–145)
Total Bilirubin: 0.6 mg/dL (ref 0.0–1.2)
Total Protein: 7.5 g/dL (ref 6.5–8.1)

## 2024-06-11 LAB — GENETIC SCREENING ORDER

## 2024-06-11 NOTE — Progress Notes (Signed)
 [facility] CONSULT NOTE  Patient Care Team: Cristopher Suzen HERO, NP as PCP - General Cristopher Suzen HERO, NP Tyree Nanetta SAILOR, RN as Oncology Nurse Navigator Gerome, Devere HERO, RN as Oncology Nurse Navigator Vernetta Berg, MD as Consulting Physician (General Surgery) Loretha Ash, MD as Consulting Physician (Hematology and Oncology) Izell Domino, MD as Attending Physician (Radiation Oncology)  CHIEF COMPLAINTS/PURPOSE OF CONSULTATION:  Newly diagnosed breast cancer  HISTORY OF PRESENTING ILLNESS:  Mary Mendez 63 y.o. female is here because of recent diagnosis of left breast IDC  I reviewed her records extensively and collaborated the history with the patient.  SUMMARY OF ONCOLOGIC HISTORY: Oncology History  Malignant neoplasm of upper-inner quadrant of left breast in female, estrogen receptor positive (HCC)  06/03/2024 Pathology Results   Breast, left, needle core biopsy, mass with calcifications 5mm upper inner quadrant (ribbon clip) :      INVASIVE MAMMARY CARCINOMA      TUBULE FORMATION: SCORE 3      NUCLEAR PLEOMORPHISM: SCORE 2      MITOTIC COUNT: SCORE 1      TOTAL SCORE: 6      OVERALL GRADE: 2      LYMPHOVASCULAR INVASION: NOT IDENTIFIED      CANCER LENGTH: 5.5 MM      CALCIFICATIONS: PRESENT      OTHER FINDINGS: NONE      SEE NOTE       2. Breast, left, needle core biopsy, 2.5cm group upper inner quadrant (x clip) :      DUCTAL CARCINOMA IN SITU, INTERMEDIATE GRADE, SOLID TYPE      NECROSIS: PRESENT      CALCIFICATIONS: PRESENT      DCIS LENGTH: 1.0 CM / 10 MM      SEE NOTE  IMMUNOHISTOCHEMICAL AND MORPHOMETRIC ANALYSIS PERFORMED MANUALLY The tumor cells are NEGATIVE for Her2 (0). Estrogen Receptor:  100%, POSITIVE, STRONG STAINING INTENSITY Progesterone Receptor:  20%, POSITIVE, MODERATE STAINING INTENSITY Proliferation Marker Ki67:  60%     06/04/2024 Mammogram   Suspicious findings. The suggested screening mammographic asymmetry is  consistent with a small irregular mass/focal asymmetry with a few associated calcifications projecting in the upper inner left breast measuring 5 mm mammographically. This may correspond to the hypoechoic mass seen on sonography at 10 o'clock, 8 cm the nipple or to the second sonographic abnormality, at 10:30 o'clock, 10 cm from the nipple. This latter finding is smaller and more subtle. Given the uncertainty of which sonographic finding may be the correlate to the mammographic abnormality, stereotactic biopsy of the mammographic finding is recommended.  2.5 cm group of suspicious calcifications in the medial left breast, anterior to the above described small irregular mass/focal asymmetry. Stereotactic core needle biopsy is recommended.   06/09/2024 Initial Diagnosis   Malignant neoplasm of upper-inner quadrant of left breast in female, estrogen receptor positive (HCC)   06/11/2024 Cancer Staging   Staging form: Breast, AJCC 8th Edition - Clinical stage from 06/11/2024: Stage IA (cT1b, cN0, cM0, G2, ER+, PR+, HER2-) - Signed by Loretha Ash, MD on 06/11/2024 Stage prefix: Initial diagnosis Histologic grading system: 3 grade system Laterality: Left Staged by: Pathologist and managing physician Stage used in treatment planning: Yes National guidelines used in treatment planning: Yes Type of national guideline used in treatment planning: NCCN     Discussed the use of AI scribe software for clinical note transcription with the patient, who gave verbal consent to proceed.  History of Present Illness Mary Mendez  Mary Mendez is a 63 year old female with breast cancer who presents for oncology consultation.  She has been diagnosed with stage one breast cancer, with two areas of concern identified on her mammogram. The smaller area is invasive cancer, while the larger area is noninvasive breast cancer . These areas are approximately five centimeters apart.  Her cancer is estrogen and progesterone  receptor positive, and the KI-67 proliferation marker is at 60%, Her 2 neg.  Her family history is significant for breast cancer, as her mother had breast cancer at a young age.  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 She resides in [place], not far from the medical facility.    MEDICAL HISTORY:  Past Medical History:  Diagnosis Date   Asthma    Bronchitis    COVID-19 08/2021   Hypertension    Pneumonia     SURGICAL HISTORY: Past Surgical History:  Procedure Laterality Date   ABDOMINAL HYSTERECTOMY  2010   and left ovaries;   BREAST BIOPSY     BREAST BIOPSY Left 06/04/2024   MM LT BREAST BX W LOC DEV 1ST LESION IMAGE BX SPEC STEREO GUIDE 06/04/2024 [facility]   BREAST BIOPSY Left 06/04/2024   MM LT BREAST BX W LOC DEV EA AD LESION IMG BX SPEC STEREO GUIDE 06/04/2024 [facility]   BREAST EXCISIONAL BIOPSY Left    BREAST SURGERY     CHOLECYSTECTOMY N/A 02/18/2022   Procedure: LAPAROSCOPIC CHOLECYSTECTOMY;  Surgeon: Signe Mitzie LABOR, MD;  Location: [facility] OR;  Service: General;  Laterality: N/A;   HYSTEROTOMY      SOCIAL HISTORY: Social History   Socioeconomic History   Marital status: Married    Spouse name: Not on file   Number of children: Not on file   Years of education: Not on file   Highest education level: Not on file  Occupational History   Not on file  Tobacco Use   Smoking status: Never    Passive exposure: Never   Smokeless tobacco: Never  Vaping Use   Vaping status: Never Used  Substance and Sexual Activity   Alcohol use: Yes    Comment: recently none (2025)   Drug use: Never   Sexual activity: Not on file  Other Topics Concern   Not on file  Social History Narrative   ** Merged History Encounter **       Social Drivers of Corporate investment banker Strain: Not on file  Food Insecurity: Not on file  Transportation Needs: Not on file  Physical Activity: Not on file  Stress:  Not on file  Social Connections: Not on file  Intimate Partner Violence: Not on file    FAMILY HISTORY: Family History  Problem Relation Age of Onset   Breast cancer Mother 9   Emphysema Father    Leukemia Maternal Aunt 49 - 79   Asthma Paternal Uncle    Colon cancer Neg Hx    Colon polyps Neg Hx    Esophageal cancer Neg Hx    Rectal cancer Neg Hx    Stomach cancer Neg Hx     ALLERGIES:  has no known allergies.  MEDICATIONS:  Current Outpatient Medications  Medication Sig Dispense Refill   acetaminophen  (TYLENOL ) 500 MG tablet Take 1,000 mg by mouth every 6 (six) hours as needed for mild pain.     albuterol  (PROVENTIL ) (2.5 MG/3ML) 0.083% nebulizer solution Take 3 mLs (2.5 mg total) by nebulization every 6 (six)  hours as needed for wheezing or shortness of breath. 75 mL 12   albuterol  (VENTOLIN  HFA) 108 (90 Base) MCG/ACT inhaler Inhale into the lungs every 6 (six) hours as needed for wheezing or shortness of breath.     amLODipine (NORVASC) 5 MG tablet Take 5 mg by mouth daily.     busPIRone (BUSPAR) 10 MG tablet Take 10 mg by mouth 3 (three) times daily as needed.     ibuprofen (ADVIL) 200 MG tablet Take 400 mg by mouth every 6 (six) hours as needed for headache (pain).     Ibuprofen 200 MG CAPS Take 200 mg by mouth every 8 (eight) hours as needed. Pain     lactulose (CHRONULAC) 10 GM/15ML solution SMARTSIG:15 Milliliter(s) By Mouth Daily PRN (Patient not taking: Reported on 12/19/2022)     lisinopril (ZESTRIL) 5 MG tablet Take 5 mg by mouth daily. (Patient not taking: Reported on 12/19/2022)     naproxen (NAPROSYN) 500 MG tablet Take 500 mg by mouth 2 (two) times daily with a meal. (Patient not taking: Reported on 12/19/2022)     Omega-3 Fatty Acids (FISH OIL PO) Take 1 capsule by mouth daily.     tretinoin (RETIN-A) 0.05 % cream Apply 1 application topically at bedtime. Apply to face  9   tretinoin (RETIN-A) 0.1 % cream Apply topically at bedtime as needed.     No current  facility-administered medications for this visit.    REVIEW OF SYSTEMS:   Constitutional: Denies fevers, chills or abnormal night sweats Eyes: Denies blurriness of vision, double vision or watery eyes Ears, nose, mouth, throat, and face: Denies mucositis or sore throat Respiratory: Denies cough, dyspnea or wheezes Cardiovascular: Denies palpitation, chest discomfort or lower extremity swelling Gastrointestinal:  Denies nausea, heartburn or change in bowel habits Skin: Denies abnormal skin rashes Lymphatics: Denies new lymphadenopathy or easy bruising Neurological:Denies numbness, tingling or new weaknesses Behavioral/Psych: Mood is stable, no new changes  Breast: Denies any palpable lumps or discharge All other systems were reviewed with the patient and are negative.  PHYSICAL EXAMINATION: ECOG PERFORMANCE STATUS: 0 - Asymptomatic  Vitals:   06/11/24 0927  BP: 118/69  Pulse: 87  Resp: 18  Temp: 98.6 F (37 C)  SpO2: 99%   Filed Weights   06/11/24 0927  Weight: 219 lb 14.4 oz (99.7 kg)    GENERAL:alert, no distress and comfortable Post biopsy changes left breast No def palpable masses No regional adenopathy   LABORATORY DATA:  I have reviewed the data as listed Lab Results  Component Value Date   WBC 8.4 02/17/2022   HGB 13.9 02/17/2022   HCT 41.5 02/17/2022   MCV 90.6 02/17/2022   PLT 248 02/17/2022   Lab Results  Component Value Date   NA 141 02/17/2022   K 4.2 02/17/2022   CL 109 02/17/2022   CO2 25 02/17/2022    RADIOGRAPHIC STUDIES: I have personally reviewed the radiological reports and agreed with the findings in the report.  ASSESSMENT AND PLAN:  Malignant neoplasm of upper-inner quadrant of left breast in female, estrogen receptor positive (HCC) Assessment and Plan Assessment & Plan Estrogen receptor positive invasive breast cancer, left breast, with concurrent ductal carcinoma in situ Stage 1 breast cancer with two areas of concern. Smaller  area is invasive, larger area with DCIS. Tumor is ER/PR positive, Her 2 neg, KI-67 at 60%.  Oncotype DX planned to assess chemotherapy need. Score <26 may negate chemotherapy; >26 will consider it. - Perform Oncotype DX  testing on tumor specimen during surgery. - Discuss potential chemotherapy if Oncotype score is over 26. Adjuvant radiation therapy planned post-surgery to reduce recurrence risk.  Planned adjuvant endocrine therapy with aromatase inhibitor for left breast cancer Endocrine therapy with anastrozole planned post-radiation to reduce estrogen levels. Monitor for bone density loss. Recommend weight-bearing exercises and dietary calcium. Consider bisphosphonates or vitamin D and calcium based on bone health. - Prescribe anastrozole (Arimidex) to start after radiation therapy. - Monitor bone density every two years while on anastrozole. - Advise on weight-bearing exercises to support bone density. - Consider bisphosphonates if osteoporotic, or vitamin D and calcium with weight bearing exercises if osteopenic.    Time spent: 45 min including H and P, review of records, counseling and coordination of care. All questions were answered. The patient knows to call the clinic with any problems, questions or concerns.    Amber Stalls, MD 06/11/24

## 2024-06-11 NOTE — Progress Notes (Signed)
 [facility] Clinical Social Work  Initial Assessment   Mary Mendez is a 63 y.o. year old female accompanied by husband, Mary Mendez, daughter Mary Mendez and grandson Mary Mendez. Clinical Social Work was referred by [facility] for assessment of psychosocial needs.   SDOH (Social Determinants of Health) assessments performed: Yes SDOH Interventions    Flowsheet Row Clinical Support from 06/11/2024 in [facility] - A Dept Of [place]. [facility]  SDOH Interventions   Food Insecurity Interventions Intervention Not Indicated  Housing Interventions Intervention Not Indicated  Transportation Interventions Intervention Not Indicated  Utilities Interventions Intervention Not Indicated    SDOH Screenings   Food Insecurity: No Food Insecurity (06/11/2024)  Housing: Low Risk  (06/11/2024)  Transportation Needs: No Transportation Needs (06/11/2024)  Utilities: Not At Risk (06/11/2024)  Depression (PHQ2-9): Low Risk  (06/11/2024)  Tobacco Use: Low Risk  (06/11/2024)     Distress Screen completed: No     No data to display            Family/Social Information:  Housing Arrangement: patient lives with her husband. Daughter, grandson and granddaughter live nearby Family members/support persons in your life? 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 yes, was feeling anxious but feels better after meeting with the medical team and learning more about staging and tx plan Patient reported stressors: Anxiety/ nervousness and Adjusting to my illness Patient enjoys time with family/ friends and food,  walking Current coping skills/ strengths: Capable of independent living , Motivation for treatment/growth , and Supportive family/friends     SUMMARY: Current SDOH Barriers:  No major barriers identified today  Clinical Social Work Clinical Goal(s):  No clinical social work goals at this time  Interventions: Discussed common feeling and emotions when being diagnosed with cancer, and the importance of support during treatment Informed patient of the support team roles and support services at [facility] Provided CSW contact information and encouraged patient to call with any questions or concerns   Follow Up Plan: Patient will contact CSW with any support or resource needs Patient verbalizes understanding of plan: Yes    Mary Mendez E Valora Norell, LCSW Clinical Social Worker [facility]

## 2024-06-11 NOTE — Research (Signed)
 [employer] 2021-05 - Specimen Collection Study to Evaluate Biomarkers in Subjects with Cancer   This Nurse has reviewed this patient's inclusion and exclusion criteria as a second review and confirms Mary Mendez is eligible for study participation.  Patient may continue with enrollment.  Cherylyn Hoard, BSN, RN, [employer] Clinical Research Nurse II [phone] 06/11/2024 12:08 PM

## 2024-06-11 NOTE — Progress Notes (Signed)
 REFERRING PROVIDER: Loretha Ash, MD  PRIMARY PROVIDER:  Cristopher Suzen HERO, NP  PRIMARY REASON FOR VISIT:  1. Malignant neoplasm of upper-inner quadrant of left breast in female, estrogen receptor positive (HCC)   2. Family history of breast cancer    HISTORY OF PRESENT ILLNESS:   Ms. Gaba, a 63 y.o. female, was seen for a [place] cancer genetics consultation during the breast multidisciplinary clinic at the request of Dr. Loretha due to a personal and family history of cancer.  Ms. Creasman presents to clinic today to discuss the possibility of a hereditary predisposition to cancer, to discuss genetic testing, and to further clarify her future cancer risks, as well as potential cancer risks for family members.   In August 2025, at the age of 43, Ms. Verry was diagnosed with invasive mammary carcinoma of the left breast (ER/PR positive, HER2 negative).   Past Medical History:  Diagnosis Date   Asthma    Bronchitis    COVID-19 08/2021   Hypertension    Pneumonia     Past Surgical History:  Procedure Laterality Date   ABDOMINAL HYSTERECTOMY  2010   and left ovaries;   BREAST BIOPSY     BREAST BIOPSY Left 06/04/2024   MM LT BREAST BX W LOC DEV 1ST LESION IMAGE BX SPEC STEREO GUIDE 06/04/2024 [facility]   BREAST BIOPSY Left 06/04/2024   MM LT BREAST BX W LOC DEV EA AD LESION IMG BX SPEC STEREO GUIDE 06/04/2024 [facility]   BREAST EXCISIONAL BIOPSY Left    BREAST SURGERY     CHOLECYSTECTOMY N/A 02/18/2022   Procedure: LAPAROSCOPIC CHOLECYSTECTOMY;  Surgeon: Signe Mitzie LABOR, MD;  Location: [facility] OR;  Service: General;  Laterality: N/A;   HYSTEROTOMY      Social History   Socioeconomic History   Marital status: Married    Spouse name: Not on file   Number of children: Not on file   Years of education: Not on file   Highest education level: Not on file  Occupational History   Not on file  Tobacco Use   Smoking status: Never    Passive exposure: Never    Smokeless tobacco: Never  Vaping Use   Vaping status: Never Used  Substance and Sexual Activity   Alcohol use: Yes    Comment: recently none (2025)   Drug use: Never   Sexual activity: Not on file  Other Topics Concern   Not on file  Social History Narrative   ** Merged History Encounter **       Social Drivers of Corporate investment banker Strain: Not on file  Food Insecurity: Not on file  Transportation Needs: Not on file  Physical Activity: Not on file  Stress: Not on file  Social Connections: Not on file     FAMILY HISTORY:  We obtained a detailed, 4-generation family history.  Significant diagnoses are listed below: Family History  Problem Relation Age of Onset   Breast cancer Mother 51   Leukemia Maternal Aunt 15 - 67     Ms. Maple is unaware of previous family history of genetic testing for hereditary cancer risks. There is no reported Ashkenazi Jewish ancestry.   GENETIC COUNSELING ASSESSMENT: Ms. Leventhal is a 63 y.o. female with a personal and family history of cancer which is somewhat suggestive of a hereditary cancer syndrome and predisposition to cancer given her mother's young age at diagnosis. We, therefore, discussed and recommended the following at today's visit.   DISCUSSION:  We discussed that 5 - 10% of cancer is hereditary, with most cases of hereditary breast cancer associated with mutations in BRCA1/2.  There are other genes that can be associated with hereditary breast cancer syndromes. Type of cancer risk and level of risk are gene-specific. We discussed that testing is beneficial for several reasons including knowing how to follow individuals after completing their treatment, identifying whether potential treatment options would be beneficial, and understanding if other family members could be at risk for cancer and allowing them to undergo genetic testing.   We reviewed the characteristics, features and inheritance patterns of hereditary cancer  syndromes. We also discussed genetic testing, including the appropriate family members to test, the process of testing, insurance coverage and turn-around-time for results. We discussed the implications of a negative, positive and/or variant of uncertain significant result. In order to get genetic test results in a timely manner so that Ms. Ennis can use these genetic test results for surgical decisions, we recommended Ms. Thurmon pursue genetic testing for the BRCAplus. Once complete, we recommend Ms. Hingle pursue reflex genetic testing to a more comprehensive gene panel.   Ms. Bohl elected to have [facility] CancerNext-Expanded Panel. The CancerNext-Expanded gene panel offered by [facility] and includes sequencing, rearrangement, and RNA analysis for the following 77 genes: AIP, ALK, APC, ATM, AXIN2, BAP1, BARD1, BMPR1A, BRCA1, BRCA2, BRIP1, CDC73, CDH1, CDK4, CDKN1B, CDKN2A, CEBPA, CHEK2, CTNNA1, DDX41, DICER1, ETV6, FH, FLCN, GATA2, LZTR1, MAX, MBD4, MEN1, MET, MLH1, MSH2, MSH3, MSH6, MUTYH, NF1, NF2, NTHL1, PALB2, PHOX2B, PMS2, POT1, PRKAR1A, PTCH1, PTEN, RAD51C, RAD51D, RB1, RET, RPS20, RUNX1, SDHA, SDHAF2, SDHB, SDHC, SDHD, SMAD4, SMARCA4, SMARCB1, SMARCE1, STK11, SUFU, TMEM127, TP53, TSC1, TSC2, VHL, and WT1 (sequencing and deletion/duplication); EGFR, HOXB13, KIT, MITF, PDGFRA, POLD1, and POLE (sequencing only); EPCAM and GREM1 (deletion/duplication only).   Based on Ms. Christenberry's personal and family history of cancer, she meets medical criteria for genetic testing. Despite that she meets criteria, she may still have an out of pocket cost. We discussed that if her out of pocket cost for testing is over $100, the laboratory should contact them to discuss self-pay prices, patient pay assistance programs, if applicable, and other billing options.  PLAN: After considering the risks, benefits, and limitations, Ms. Mittman provided informed consent to pursue genetic testing and the blood sample was sent to  [facility] for analysis of the CancerNext-Expanded Panel. Results should be available within approximately 1-2 weeks' time, at which point they will be disclosed by telephone to Ms. Lappe, as will any additional recommendations warranted by these results. Ms. Zaffino will receive a summary of her genetic counseling visit and a copy of her results once available. This information will also be available in Epic.   Ms. Riche questions were answered to her satisfaction today. Our contact information was provided should additional questions or concerns arise. Thank you for the referral and allowing us  to share in the care of your patient.   Ayeisha Lindenberger, MS, [facility] Genetic Counselor [email or web address] (P) [phone]  30 minutes were spent on the date of the encounter in service to the patient including preparation, face-to-face consultation, documentation and care coordination.  The patient brought her husband, daughter, and grandson. Drs. Gudena and/or Lanny were available to discuss this case as needed.  _______________________________________________________________________ For Office Staff:  Number of people involved in session: 4 Was an Intern/ student involved with case: no

## 2024-06-11 NOTE — Research (Signed)
 [employer] 2021-05 - Specimen Collection Study to Evaluate Biomarkers in Subjects with Cancer    Patient Mary Mendez was identified by Dr. Loretha as a potential candidate for the above listed study.  This Clinical Research Coordinator met with MEARA WIECHMAN, [id number], on 06/11/24 in a manner and location that ensures patient privacy to discuss participation in the above listed research study.  Patient is Accompanied by husband, daughter and grandson.  A copy of the informed consent document with embedded HIPAA language was provided to the patient.  Patient reads, speaks, and understands [place].    Patient was provided with the business card of this Coordinator and encouraged to contact the research team with any questions.  Patient was provided the option of taking informed consent documents home to review and was encouraged to review at their convenience with their support network, including other care providers. Patient is comfortable with making a decision regarding study participation today.  As outlined in the informed consent form, this Coordinator and Raphaela A Karnik discussed the purpose of the research study, the investigational nature of the study, study procedures and requirements for study participation, potential risks and benefits of study participation, as well as alternatives to participation. This study is not blinded. The patient understands participation is voluntary and they may withdraw from study participation at any time.  This study does not involve randomization.  This study does not involve an investigational drug or device. This study does not involve a placebo. Patient understands enrollment is pending full eligibility review.   Confidentiality and how the patient's information will be used as part of study participation were discussed.  Patient was informed there is reimbursement provided for their time and effort spent on trial participation.  The patient is  encouraged to discuss research study participation with their insurance provider to determine what costs they may incur as part of study participation, including research related injury.    All questions were answered to patient's satisfaction.  The informed consent with embedded HIPAA language was reviewed page by page.  The patient's mental and emotional status is appropriate to provide informed consent, and the patient verbalizes an understanding of study participation.  Patient has agreed to participate in the above listed research study and has voluntarily signed the informed consent version 28 Mar 2024 with embedded HIPAA language, version 28 Mar 2024 on 06/11/24 at 12 PM.  The patient was provided with a copy of the signed informed consent form with embedded HIPAA language for their reference.  No study specific procedures were obtained prior to the signing of the informed consent document.  Approximately 10 minutes were spent with the patient reviewing the informed consent documents.  Patient was not requested to complete a Release of Information form.   Eligibility: Eligibility criteria reviewed with patient. This nurse/coordinator has reviewed this patient's inclusion and exclusion criteria and confirmed patient is eligible for study participation. Eligibility confirmed by treating investigator, Dr. Loretha, who also agrees that patient should proceed with enrollment. Patient will continue with enrollment.  Data Collection: Patient was interviewed to collect the following information.  Medical History:  High Blood Pressure  Yes Coronary Artery Disease No Lupus    No Rheumatoid Arthritis  No Diabetes   No       Lynch Syndrome  No  Is the patient currently taking a magnesium supplement?   No  Does the patient have a personal history of cancer (greater than 5 years ago)?  No  Does the  patient have a family history of cancer in 1st or 2nd degree relatives? Yes If yes, Relationship(s) and  Cancer type(s)? Mom- breast cancer  Does the patient have history of alcohol consumption? Yes   If yes, current or former? Current  Number of years? since 63 yrs old; 40 yrs Drinks per week? .5  Does the patient have history of cigarette, cigar, pipe, or chewing tobacco use?  No   Blood Collection: Research blood obtained by fresh venipuncture with Hobert Decent, Research Specialist. Patient tolerated well without any adverse events.   Gift Card: $50 gift card given to patient for her participation in this study.    Patient was thanked for their participation in this study.

## 2024-06-11 NOTE — Assessment & Plan Note (Signed)
 Assessment and Plan Assessment & Plan Estrogen receptor positive invasive breast cancer, left breast, with concurrent ductal carcinoma in situ Stage 1 breast cancer with two areas of concern. Smaller area is invasive, larger area with DCIS. Tumor is ER/PR positive, Her 2 neg, KI-67 at 60%.  Oncotype DX planned to assess chemotherapy need. Score <26 may negate chemotherapy; >26 will consider it. - Perform Oncotype DX testing on tumor specimen during surgery. - Discuss potential chemotherapy if Oncotype score is over 26. Adjuvant radiation therapy planned post-surgery to reduce recurrence risk.  Planned adjuvant endocrine therapy with aromatase inhibitor for left breast cancer Endocrine therapy with anastrozole planned post-radiation to reduce estrogen levels. Monitor for bone density loss. Recommend weight-bearing exercises and dietary calcium. Consider bisphosphonates or vitamin D and calcium based on bone health. - Prescribe anastrozole (Arimidex) to start after radiation therapy. - Monitor bone density every two years while on anastrozole. - Advise on weight-bearing exercises to support bone density. - Consider bisphosphonates if osteoporotic, or vitamin D and calcium with weight bearing exercises if osteopenic.

## 2024-06-12 DIAGNOSIS — Z170 Estrogen receptor positive status [ER+]: Secondary | ICD-10-CM | POA: Diagnosis not present

## 2024-06-12 DIAGNOSIS — C50212 Malignant neoplasm of upper-inner quadrant of left female breast: Secondary | ICD-10-CM | POA: Diagnosis not present

## 2024-06-13 ENCOUNTER — Encounter: Payer: Self-pay | Admitting: Radiation Oncology

## 2024-06-18 ENCOUNTER — Inpatient Hospital Stay
Admission: RE | Admit: 2024-06-18 | Discharge: 2024-06-18 | Discharge status: Home / Self Care | Source: Ambulatory Visit | Attending: Surgery | Admitting: Surgery

## 2024-06-18 ENCOUNTER — Other Ambulatory Visit: Payer: Self-pay | Admitting: Surgery

## 2024-06-18 DIAGNOSIS — Z853 Personal history of malignant neoplasm of breast: Secondary | ICD-10-CM

## 2024-06-18 DIAGNOSIS — Z170 Estrogen receptor positive status [ER+]: Secondary | ICD-10-CM

## 2024-06-18 DIAGNOSIS — D0512 Intraductal carcinoma in situ of left breast: Secondary | ICD-10-CM | POA: Diagnosis not present

## 2024-06-18 MED ORDER — GADOPICLENOL 0.5 MMOL/ML IV SOLN
10.0000 mL | Freq: Once | INTRAVENOUS | Status: AC | PRN
  Administered 2024-06-18: 10 mL via INTRAVENOUS

## 2024-06-19 ENCOUNTER — Other Ambulatory Visit: Payer: Self-pay

## 2024-06-19 ENCOUNTER — Encounter: Payer: Self-pay | Admitting: *Deleted

## 2024-06-19 ENCOUNTER — Telehealth: Payer: Self-pay | Admitting: Genetic Counselor

## 2024-06-19 ENCOUNTER — Encounter: Payer: Self-pay | Admitting: Genetic Counselor

## 2024-06-19 ENCOUNTER — Ambulatory Visit: Attending: Surgery

## 2024-06-19 ENCOUNTER — Telehealth: Payer: Self-pay | Admitting: *Deleted

## 2024-06-19 DIAGNOSIS — R293 Abnormal posture: Secondary | ICD-10-CM | POA: Diagnosis not present

## 2024-06-19 DIAGNOSIS — Z170 Estrogen receptor positive status [ER+]: Secondary | ICD-10-CM | POA: Insufficient documentation

## 2024-06-19 DIAGNOSIS — C50212 Malignant neoplasm of upper-inner quadrant of left female breast: Secondary | ICD-10-CM | POA: Diagnosis not present

## 2024-06-19 DIAGNOSIS — Z1379 Encounter for other screening for genetic and chromosomal anomalies: Secondary | ICD-10-CM | POA: Insufficient documentation

## 2024-06-19 NOTE — Telephone Encounter (Addendum)
 I contacted [name] to discuss her genetic testing results. No pathogenic variants were identified in the 77 genes analyzed. 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 Detailed clinic note to follow.  The test report has been scanned into EPIC and is located under the Molecular Pathology section of the Results Review tab.  A portion of the result report is included below for reference.   Mary Holloman, MS, [facility] Genetic Counselor [email or web address] (P) [phone]

## 2024-06-19 NOTE — Therapy (Signed)
 OUTPATIENT PHYSICAL THERAPY BREAST CANCER BASELINE EVALUATION   Patient Name: Mary Mendez MRN: [id number] DOB:11-12-1960, 63 y.o., female Today's Date: 06/19/2024  END OF SESSION:  PT End of Session - 06/19/24 1553     Visit Number 1    Number of Visits 2    Date for PT Re-Evaluation 08/14/24    Authorization Type BCBS    PT Start Time 1502    PT Stop Time 1550    PT Time Calculation (min) 48 min    Activity Tolerance Patient tolerated treatment well    Behavior During Therapy [facility] for tasks assessed/performed          Past Medical History:  Diagnosis Date   Asthma    Bronchitis    COVID-19 08/2021   Hypertension    Pneumonia    Past Surgical History:  Procedure Laterality Date   ABDOMINAL HYSTERECTOMY  2010   and left ovaries;   BREAST BIOPSY     BREAST BIOPSY Left 06/04/2024   MM LT BREAST BX W LOC DEV 1ST LESION IMAGE BX SPEC STEREO GUIDE 06/04/2024 [facility]   BREAST BIOPSY Left 06/04/2024   MM LT BREAST BX W LOC DEV EA AD LESION IMG BX SPEC STEREO GUIDE 06/04/2024 [facility]   BREAST EXCISIONAL BIOPSY Left    BREAST SURGERY     CHOLECYSTECTOMY N/A 02/18/2022   Procedure: LAPAROSCOPIC CHOLECYSTECTOMY;  Surgeon: Signe Mitzie DELENA, MD;  Location: [facility] OR;  Service: General;  Laterality: N/A;   HYSTEROTOMY     Patient Active Problem List   Diagnosis Date Noted   Genetic testing 06/19/2024   Malignant neoplasm of upper-inner quadrant of left breast in female, estrogen receptor positive (HCC) 06/09/2024    PCP:   REFERRING PROVIDER: Vicenta Poli, MD  REFERRING DIAG: Left Breast Cancer  THERAPY DIAG:  Malignant neoplasm of upper-inner quadrant of left breast in female, estrogen receptor positive (HCC)  Abnormal posture  Rationale for Evaluation and Treatment: Rehabilitation  ONSET DATE: 06/03/2024  SUBJECTIVE:                                                                                                                                                                                            SUBJECTIVE STATEMENT: Patient reports she is here today to be seen by her medical team for her newly diagnosed left breast cancer.  I have had a little left shoulder pain recently maybe from sleeping on it wrong. Mostly in the back.  PERTINENT HISTORY:  Patient was diagnosed on 06/03/2024 with left grade 2 IDC and has an additional mass that is DCIS. It is located in  the upper-inner quadrant. It is ER+, PR+, HER 2- with a Ki67 of 60%. She will have oncotype testing to determine need for chemotherapy.  She is having oncoplastic reduction 7-10 days post lumpectomy. She does not have a surgery date yet.   PATIENT GOALS:   reduce lymphedema risk and learn post op HEP.   PAIN:  Are you having pain? No  PRECAUTIONS: Active CA, asthma  RED FLAGS: None   HAND DOMINANCE: right  WEIGHT BEARING RESTRICTIONS: No  FALLS:  Has patient fallen in last 6 months? No  LIVING ENVIRONMENT: Patient lives with: husband  OCCUPATION: Hypnotherapy;creates MP3 recordings  LEISURE: walk, pool, enjoy screen porch  PRIOR LEVEL OF FUNCTION: Independent   OBJECTIVE: Note: Objective measures were completed at Evaluation unless otherwise noted.  COGNITION: Overall cognitive status: Within functional limits for tasks assessed    POSTURE:  Forward head and rounded shoulders posture  UPPER EXTREMITY AROM/PROM:  A/PROM RIGHT   eval   Shoulder extension 45  Shoulder flexion 158  Shoulder abduction 170  Shoulder internal rotation 72  Shoulder external rotation 105    (Blank rows = not tested)  A/PROM LEFT   eval  Shoulder extension 45  Shoulder flexion 154  Shoulder abduction 174  Shoulder internal rotation 68  Shoulder external rotation 91    (Blank rows = not tested)  CERVICAL AROM: All within fxl limits:    UPPER EXTREMITY STRENGTH: WNL  LYMPHEDEMA ASSESSMENTS (in cm):   LANDMARK RIGHT   eval  10 cm proximal to olecranon  process 32.5  Olecranon process 26.7  10 cm proximal to ulnar styloid process 20.6  Just proximal to ulnar styloid process 15.8  Across hand at thumb web space 19.3  At base of 2nd digit 6.6  (Blank rows = not tested)  LANDMARK LEFT   eval  10 cm proximal to olecranon process 33.5  Olecranon process 26.3  10 cm proximal to ulnar styloid process 20.7  Just proximal to ulnar styloid process 15.9  Across hand at thumb web space 18.9  At base of 2nd digit 6.6  (Blank rows = not tested)  L-DEX LYMPHEDEMA SCREENING:  The patient was assessed using the L-Dex machine today to produce a lymphedema index baseline score. The patient will be reassessed on a regular basis (typically every 3 months) to obtain new L-Dex scores. If the score is > 6.5 points away from his/her baseline score indicating onset of subclinical lymphedema, it will be recommended to wear a compression garment for 4 weeks, 12 hours per day and then be reassessed. If the score continues to be > 6.5 points from baseline at reassessment, we will initiate lymphedema treatment. Assessing in this manner has a 95% rate of preventing clinically significant lymphedema.   L-DEX FLOWSHEETS - 06/19/24 1500       L-DEX LYMPHEDEMA SCREENING   Measurement Type Unilateral    L-DEX MEASUREMENT EXTREMITY Upper Extremity    POSITION  Standing    DOMINANT SIDE Right    At Risk Side Left    BASELINE SCORE (UNILATERAL) 7.2          QUICK DASH SURVEY: 13.64  PATIENT EDUCATION:  Education details: Time spent educating patient on aspects of self-care to maximize post op recovery. Patient was educated on where and how to get a post op compression bra to use to reduce post op edema. Patient was also educated on the use of SOZO screenings and surveillance principles for early identification of lymphedema onset. She  was instructed to use the post op pillow in the axilla for pressure and pain relief. Patient educated on lymphedema risk reduction  and post op shoulder/posture HEP. Person educated: Patient Education method: Explanation, Demonstration, Handout Education comprehension: Patient verbalized understanding and returned demonstration  HOME EXERCISE PROGRAM: Patient was instructed today in a home exercise program today for post op shoulder range of motion. These included active assist shoulder flexion in sitting, scapular retraction, wall walking with shoulder abduction, and hands behind head external rotation.  She was encouraged to do these twice a day, holding 3 seconds and repeating 5 times when permitted by her physician.   ASSESSMENT:  CLINICAL IMPRESSION: Pts. multidisciplinary medical team met prior to her assessments to determine a recommended treatment plan. She is planning to have a left Lumpectomy with SLNB, and 7-10 days later an oncoplastic breast reduction. She does not have a surgery date yet.SABRA She will benefit from a post op PT reassessment to determine needs and from L-Dex screens every 3 months for 2 years to detect subclinical lymphedema.  Pt will benefit from skilled therapeutic intervention to improve on the following deficits: Decreased knowledge of precautions, impaired UE functional use, pain, decreased ROM, postural dysfunction.   PT treatment/interventions: ADL/self-care home management, pt/family education, therapeutic exercise  REHAB POTENTIAL: Good  CLINICAL DECISION MAKING: Stable/uncomplicated  EVALUATION COMPLEXITY: Low   GOALS: Goals reviewed with patient? YES  LONG TERM GOALS: (STG=LTG)    Name Target Date Goal status  1 Pt will be able to verbalize understanding of pertinent lymphedema risk reduction practices relevant to her dx specifically related to skin care.  Baseline:  No knowledge 06/19/2024 Achieved at eval  2 Pt will be able to return demo and/or verbalize understanding of the post op HEP related to regaining shoulder ROM. Baseline:  No knowledge 06/19/2024 Achieved at eval   3 Pt will be able to verbalize understanding of the importance of viewing the post op After Breast CA Class video for further lymphedema risk reduction education and therapeutic exercise.  Baseline:  No knowledge 06/19/2024 Achieved at eval  4 Pt will demo she has regained full shoulder ROM and function post operatively compared to baselines.  Baseline: See objective measurements taken today. 1016/2025 NEW    PLAN:  PT FREQUENCY/DURATION: EVAL and 1 follow up appointment.   PLAN FOR NEXT SESSION: will reassess 3-4 weeks post op to determine needs.   Patient will follow up at outpatient cancer rehab 3-4 weeks following surgery.  If the patient requires physical therapy at that time, a specific plan will be dictated and sent to the referring physician for approval. The patient was educated today on appropriate basic range of motion exercises to begin post operatively and the importance of viewing the After Breast Cancer class video following surgery.  Patient was educated today on lymphedema risk reduction practices as it pertains to recommendations that will benefit the patient immediately following surgery.  She verbalized good understanding.    Physical Therapy Information for After Breast Cancer Surgery/Treatment:  Lymphedema is a swelling condition that you may be at risk for in your arm if you have lymph nodes removed from the armpit area.  After a sentinel node biopsy, the risk is approximately 5-9% and is higher after an axillary node dissection.  There is treatment available for this condition and it is not life-threatening.  Contact your physician or physical therapist with concerns. You may begin the 4 shoulder/posture exercises (see additional sheet) when permitted by your physician (  typically a week after surgery).  If you have drains, you may need to wait until those are removed before beginning range of motion exercises.  A general recommendation is to not lift your arms above  shoulder height until drains are removed.  These exercises should be done to your tolerance and gently.  This is not a no pain/no gain type of recovery so listen to your body and stretch into the range of motion that you can tolerate, stopping if you have pain.  If you are having immediate reconstruction, ask your plastic surgeon about doing exercises as he or she may want you to wait. We encourage you to view the After Breast Cancer class video following surgery.  You will learn information related to lymphedema risk, prevention and treatment and additional exercises to regain mobility following surgery.   While undergoing any medical procedure or treatment, try to avoid blood pressure being taken or needle sticks from occurring on the arm on the side of cancer.   This recommendation begins after surgery and continues for the rest of your life.  This may help reduce your risk of getting lymphedema (swelling in your arm). An excellent resource for those seeking information on lymphedema is the National Lymphedema Network's web site. It can be accessed at www.lymphnet.org If you notice swelling in your hand, arm or breast at any time following surgery (even if it is many years from now), please contact your doctor or physical therapist to discuss this.  Lymphedema can be treated at any time but it is easier for you if it is treated early on.  If you feel like your shoulder motion is not returning to normal in a reasonable amount of time, please contact your surgeon or physical therapist.  [facility] [phone]. [address], [place] [address]  [facility] After Breast Cancer Class  After Breast Cancer Class is a specially designed exercise class video to assist you in a safe recover after having breast cancer surgery.  In this video you will learn how to get back to full function whether your drains were just removed or if you had surgery a month ago. The  video can be viewed on this page: [email or web address]-meyer.[email or web address] or on YouTube here: [email or web address]://[email or web address].  Class Goals  Understand specific stretches to improve the flexibility of you chest and shoulder. Learn ways to safely strengthen your upper body and improve your posture. Understand the warning signs of infection and why you may be at risk for an arm infection. Learn about Lymphedema and prevention.  ** You do not need to view this video until after surgery.  Drains should be removed to participate in the recommended exercises on the video.  Patient was instructed today in a home exercise program today for post op shoulder range of motion. These included active assist shoulder flexion in sitting, scapular retraction, wall walking with shoulder abduction, and hands behind head external rotation.  She was encouraged to do these twice a day, holding 3 seconds and repeating 5 times when permitted by her physician.    Grayce JINNY Sheldon, PT 06/19/2024, 3:54 PM

## 2024-06-19 NOTE — Telephone Encounter (Signed)
 Spoke with patient to follow up from [facility] 8/13 and assess navigation needs. Patient denies any questions or concerns at this time.  She did bring to my attention that there is a mistake on her MRI. She states she had a left lumpectomy in the 1980's but it was NOT malignant and the MRI states that is was malignant. Informed I would let the radiologist know so they could correct this.  Encouraged her to call should she need anything going forward.

## 2024-06-20 ENCOUNTER — Other Ambulatory Visit: Payer: Self-pay | Admitting: Surgery

## 2024-06-20 ENCOUNTER — Ambulatory Visit: Payer: Self-pay | Admitting: Genetic Counselor

## 2024-06-20 DIAGNOSIS — Z853 Personal history of malignant neoplasm of breast: Secondary | ICD-10-CM

## 2024-06-20 DIAGNOSIS — Z1379 Encounter for other screening for genetic and chromosomal anomalies: Secondary | ICD-10-CM

## 2024-06-20 NOTE — Progress Notes (Signed)
 HPI:   Mary Mendez was previously seen in the  Cancer Genetics clinic due to a personal and family history of cancer and concerns regarding a hereditary predisposition to cancer. Please refer to our prior cancer genetics clinic note for more information regarding our discussion, assessment and recommendations, at the time. Mary Mendez recent genetic test results were disclosed to her, as were recommendations warranted by these results. These results and recommendations are discussed in more detail below.  CANCER HISTORY:  Oncology History  Malignant neoplasm of upper-inner quadrant of left breast in female, estrogen receptor positive (HCC)  06/03/2024 Pathology Results   Breast, left, needle core biopsy, mass with calcifications 5mm upper inner quadrant (ribbon clip) :      INVASIVE MAMMARY CARCINOMA      TUBULE FORMATION: SCORE 3      NUCLEAR PLEOMORPHISM: SCORE 2      MITOTIC COUNT: SCORE 1      TOTAL SCORE: 6      OVERALL GRADE: 2      LYMPHOVASCULAR INVASION: NOT IDENTIFIED      CANCER LENGTH: 5.5 MM      CALCIFICATIONS: PRESENT      OTHER FINDINGS: NONE      SEE NOTE       2. Breast, left, needle core biopsy, 2.5cm group upper inner quadrant (x clip) :      DUCTAL CARCINOMA IN SITU, INTERMEDIATE GRADE, SOLID TYPE      NECROSIS: PRESENT      CALCIFICATIONS: PRESENT      DCIS LENGTH: 1.0 CM / 10 MM      SEE NOTE  IMMUNOHISTOCHEMICAL AND MORPHOMETRIC ANALYSIS PERFORMED MANUALLY The tumor cells are NEGATIVE for Her2 (0). Estrogen Receptor:  100%, POSITIVE, STRONG STAINING INTENSITY Progesterone Receptor:  20%, POSITIVE, MODERATE STAINING INTENSITY Proliferation Marker Ki67:  60%     06/04/2024 Mammogram   Suspicious findings. The suggested screening mammographic asymmetry is consistent with a small irregular mass/focal asymmetry with a few associated calcifications projecting in the upper inner left breast measuring 5 mm mammographically. This may correspond to the  hypoechoic mass seen on sonography at 10 o'clock, 8 cm the nipple or to the second sonographic abnormality, at 10:30 o'clock, 10 cm from the nipple. This latter finding is smaller and more subtle. Given the uncertainty of which sonographic finding may be the correlate to the mammographic abnormality, stereotactic biopsy of the mammographic finding is recommended.  2.5 cm group of suspicious calcifications in the medial left breast, anterior to the above described small irregular mass/focal asymmetry. Stereotactic core needle biopsy is recommended.   06/09/2024 Initial Diagnosis   Malignant neoplasm of upper-inner quadrant of left breast in female, estrogen receptor positive (HCC)   06/11/2024 Cancer Staging   Staging form: Breast, AJCC 8th Edition - Clinical stage from 06/11/2024: Stage IA (cT1b, cN0, cM0, G2, ER+, PR+, HER2-) - Signed by Loretha Ash, MD on 06/11/2024 Stage prefix: Initial diagnosis Histologic grading system: 3 grade system Laterality: Left Staged by: Pathologist and managing physician Stage used in treatment planning: Yes National guidelines used in treatment planning: Yes Type of national guideline used in treatment planning: NCCN    Genetic Testing   [facility] CancerNext-Expanded Panel+RNA was Negative. 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 Report date is 06/18/2024.   The CancerNext-Expanded gene panel offered by [facility] and includes sequencing, rearrangement, and RNA analysis for the following 77 genes: AIP, ALK, APC, ATM, AXIN2, BAP1, BARD1, BMPR1A,  BRCA1, BRCA2, BRIP1, CDC73, CDH1, CDK4, CDKN1B, CDKN2A, CEBPA, CHEK2, CTNNA1, DDX41, DICER1, ETV6, FH, FLCN, GATA2, LZTR1, MAX, MBD4, MEN1, MET, MLH1, MSH2, MSH3, MSH6, MUTYH, NF1, NF2, NTHL1, PALB2, PHOX2B, PMS2, POT1, PRKAR1A, PTCH1, PTEN, RAD51C, RAD51D, RB1, RET, RPS20, RUNX1, SDHA, SDHAF2, SDHB, SDHC, SDHD, SMAD4, SMARCA4, SMARCB1, SMARCE1, STK11, SUFU, TMEM127,  TP53, TSC1, TSC2, VHL, and WT1 (sequencing and deletion/duplication); EGFR, HOXB13, KIT, MITF, PDGFRA, POLD1, and POLE (sequencing only); EPCAM and GREM1 (deletion/duplication only).       FAMILY HISTORY:  We obtained a detailed, 4-generation family history.  Significant diagnoses are listed below:      Family History  Problem Relation Age of Onset   Breast cancer Mother 40   Leukemia Maternal Aunt 33 - 35       Ms. Gammell is unaware of previous family history of genetic testing for hereditary cancer risks. There is no reported Ashkenazi Jewish ancestry.   GENETIC TEST RESULTS:  The [facility] CancerNext-Expanded Panel found no pathogenic mutations.   The CancerNext-Expanded gene panel offered by [facility] and includes sequencing, rearrangement, and RNA analysis for the following 77 genes: AIP, ALK, APC, ATM, AXIN2, BAP1, BARD1, BMPR1A, BRCA1, BRCA2, BRIP1, CDC73, CDH1, CDK4, CDKN1B, CDKN2A, CEBPA, CHEK2, CTNNA1, DDX41, DICER1, ETV6, FH, FLCN, GATA2, LZTR1, MAX, MBD4, MEN1, MET, MLH1, MSH2, MSH3, MSH6, MUTYH, NF1, NF2, NTHL1, PALB2, PHOX2B, PMS2, POT1, PRKAR1A, PTCH1, PTEN, RAD51C, RAD51D, RB1, RET, RPS20, RUNX1, SDHA, SDHAF2, SDHB, SDHC, SDHD, SMAD4, SMARCA4, SMARCB1, SMARCE1, STK11, SUFU, TMEM127, TP53, TSC1, TSC2, VHL, and WT1 (sequencing and deletion/duplication); EGFR, HOXB13, KIT, MITF, PDGFRA, POLD1, and POLE (sequencing only); EPCAM and GREM1 (deletion/duplication only).   The test report has been scanned into EPIC and is located under the Molecular Pathology section of the Results Review tab.  A portion of the result report is included below for reference. Genetic testing reported out on 06/18/2024.       Genetic testing identified a variant of uncertain significance (VUS) in the MSH2 gene called c.51C>A.  At this time, it is unknown if this variant is associated with an increased risk for cancer or if it is benign, but most uncertain variants are reclassified to benign. It should  not be used to make medical management decisions. With time, we suspect the laboratory will determine the significance of this variant, if any. If the laboratory reclassifies this variant, we will attempt to contact Ms. Wolz to discuss it further.   Even though a pathogenic variant was not identified, possible explanations for the cancer in the family may include: There may be no hereditary risk for cancer in the family. The cancers in Ms. Rufer and/or her family may be due to other genetic or environmental factors. There may be a gene mutation in one of these genes that current testing methods cannot detect, but that chance is small. There could be another gene that has not yet been discovered, or that we have not yet tested, that is responsible for the cancer diagnoses in the family.   Therefore, it is important to remain in touch with cancer genetics in the future so that we can continue to offer Ms. Guyton the most up to date genetic testing.   ADDITIONAL GENETIC TESTING:  We discussed with Ms. Abadi that her genetic testing was fairly extensive.  If there are genes identified to increase cancer risk that can be analyzed in the future, we would be happy to discuss and coordinate this testing at that time.    CANCER SCREENING RECOMMENDATIONS:  Ms. Wigen test result is considered negative (normal).  This means that we have not identified a hereditary cause for her personal and family history of cancer at this time.   An individual's cancer risk and medical management are not determined by genetic test results alone. Overall cancer risk assessment incorporates additional factors, including personal medical history, family history, and any available genetic information that may result in a personalized plan for cancer prevention and surveillance. Therefore, it is recommended she continue to follow the cancer management and screening guidelines provided by her oncology and primary healthcare  provider.  RECOMMENDATIONS FOR FAMILY MEMBERS:   Since she did not inherit a mutation in a cancer predisposition gene included on this panel, her daughter could not have inherited a mutation from her in one of these genes. Individuals in this family might be at some increased risk of developing cancer, over the general population risk, due to the family history of cancer. We recommend women in this family have a yearly mammogram beginning at age 69, or 68 years younger than the earliest onset of cancer, an annual clinical breast exam, and perform monthly breast self-exams. We do not recommend familial testing for the MSH2 variant of uncertain significance (VUS).  FOLLOW-UP:  Cancer genetics is a rapidly advancing field and it is possible that new genetic tests will be appropriate for her and/or her family members in the future. We encouraged her to remain in contact with cancer genetics on an annual basis so we can update her personal and family histories and let her know of advances in cancer genetics that may benefit this family.   Our contact number was provided. Ms. Radliff questions were answered to her satisfaction, and she knows she is welcome to call us  at anytime with additional questions or concerns.   Berdia Lachman, MS, [facility] Genetic Counselor [email or web address] (P) [phone]

## 2024-06-24 ENCOUNTER — Encounter: Payer: Self-pay | Admitting: *Deleted

## 2024-06-24 ENCOUNTER — Telehealth: Payer: Self-pay | Admitting: *Deleted

## 2024-06-24 NOTE — Telephone Encounter (Signed)
 Patient called in with a few follow up questions from breast clinic about the process. Questions answered to patient's satisfaction and she knows she is welcome to call back if any other needs arise.

## 2024-06-25 ENCOUNTER — Telehealth: Payer: Self-pay | Admitting: Hematology and Oncology

## 2024-06-25 NOTE — Telephone Encounter (Signed)
 left vm for pt about scheduled appt date and time

## 2024-06-30 DIAGNOSIS — C801 Malignant (primary) neoplasm, unspecified: Secondary | ICD-10-CM

## 2024-06-30 HISTORY — DX: Malignant (primary) neoplasm, unspecified: C80.1

## 2024-07-03 DIAGNOSIS — C50212 Malignant neoplasm of upper-inner quadrant of left female breast: Secondary | ICD-10-CM | POA: Diagnosis not present

## 2024-07-07 DIAGNOSIS — C50212 Malignant neoplasm of upper-inner quadrant of left female breast: Secondary | ICD-10-CM | POA: Diagnosis not present

## 2024-07-08 ENCOUNTER — Encounter

## 2024-07-09 DIAGNOSIS — Z170 Estrogen receptor positive status [ER+]: Secondary | ICD-10-CM | POA: Diagnosis not present

## 2024-07-09 DIAGNOSIS — N62 Hypertrophy of breast: Secondary | ICD-10-CM | POA: Diagnosis not present

## 2024-07-09 DIAGNOSIS — C50212 Malignant neoplasm of upper-inner quadrant of left female breast: Secondary | ICD-10-CM | POA: Diagnosis not present

## 2024-07-10 ENCOUNTER — Encounter

## 2024-07-11 NOTE — Progress Notes (Signed)
 Surgical Instructions   Your procedure is scheduled on Thursday, September 4th. Report to [facility] Main Entrance A at 11:00 A.M., then check in with the Admitting office. Any questions or running late day of surgery: call [phone]  Questions prior to your surgery date: call [phone], Monday-Friday, 8am-4pm. If you experience any cold or flu symptoms such as cough, fever, chills, shortness of breath, etc. between now and your scheduled surgery, please notify us  at the above number.     Remember:  Do not eat after midnight the night before your surgery   You may drink clear liquids until 10:00 the morning of your surgery.   Clear liquids allowed are: Water, Non-Citrus Juices (without pulp), Carbonated Beverages, Clear Tea (no milk, honey, etc.), Black Coffee Only (NO MILK, CREAM OR POWDERED CREAMER of any kind), and Gatorade.  Patient Instructions  The night before surgery:  No food after midnight. ONLY clear liquids after midnight  The day of surgery (if you do NOT have diabetes):  Drink ONE (1) Pre-Surgery Clear Ensure by 10:00 the morning of surgery. Drink in one sitting. Do not sip.  This drink was given to you during your hospital pre-op appointment visit.  Nothing else to drink after completing the Pre-Surgery Clear Ensure.         If you have questions, please contact your surgeon's office.     Take these medicines the morning of surgery with A SIP OF WATER  acetaminophen  (TYLENOL )  amLODipine (NORVASC)  busPIRone (BUSPAR)  rosuvastatin (CRESTOR)   May take these medicines IF NEEDED: albuterol  (PROVENTIL ) nebulizer  albuterol  (VENTOLIN  HFA) inhaler - bring with you on day of surgery    One week prior to surgery, STOP taking any Aspirin (unless otherwise instructed by your surgeon) Aleve, Naproxen, Motrin, Advil, Goody's, BC's, all herbal medications, fish oil, and non-prescription vitamins. This includes your Ibuprofen and omega-3.                      Do NOT Smoke (Tobacco/Vaping) for 24 hours prior to your procedure.  If you use a CPAP at night, you may bring your mask/headgear for your overnight stay.   You will be asked to remove any contacts, glasses, piercing's, hearing aid's, dentures/partials prior to surgery. Please bring cases for these items if needed.    Patients discharged the day of surgery will not be allowed to drive home, and someone needs to stay with them for 24 hours.  SURGICAL WAITING ROOM VISITATION Patients may have no more than 2 support people in the waiting area - these visitors may rotate.   Pre-op nurse will coordinate an appropriate time for 1 ADULT support person, who may not rotate, to accompany patient in pre-op.  Children under the age of 18 must have an adult with them who is not the patient and must remain in the main waiting area with an adult.  If the patient needs to stay at the hospital during part of their recovery, the visitor guidelines for inpatient rooms apply.  Please refer to the [facility] website for the visitor guidelines for any additional information.   If you received a COVID test during your pre-op visit  it is requested that you wear a mask when out in public, stay away from anyone that may not be feeling well and notify your surgeon if you develop symptoms. If you have been in contact with anyone that has tested positive in the last 10 days please notify you  Careers adviser.      Pre-operative CHG Bathing Instructions   You can play a key role in reducing the risk of infection after surgery. Your skin needs to be as free of germs as possible. You can reduce the number of germs on your skin by washing with CHG (chlorhexidine  gluconate) soap before surgery. CHG is an antiseptic soap that kills germs and continues to kill germs even after washing.   DO NOT use if you have an allergy to chlorhexidine /CHG or antibacterial soaps. If your skin becomes reddened or irritated, stop using the CHG  and notify one of our RNs at [phone].              TAKE A SHOWER THE NIGHT BEFORE SURGERY AND THE DAY OF SURGERY    Please keep in mind the following:  DO NOT shave, including legs and underarms, 48 hours prior to surgery.   You may shave your face before/day of surgery.  Place clean sheets on your bed the night before surgery Use a clean washcloth (not used since being washed) for each shower. DO NOT sleep with pet's night before surgery.  CHG Shower Instructions:  Wash your face and private area with normal soap. If you choose to wash your hair, wash first with your normal shampoo.  After you use shampoo/soap, rinse your hair and body thoroughly to remove shampoo/soap residue.  Turn the water OFF and apply half the bottle of CHG soap to a CLEAN washcloth.  Apply CHG soap ONLY FROM YOUR NECK DOWN TO YOUR TOES (washing for 3-5 minutes)  DO NOT use CHG soap on face, private areas, open wounds, or sores.  Pay special attention to the area where your surgery is being performed.  If you are having back surgery, having someone wash your back for you may be helpful. Wait 2 minutes after CHG soap is applied, then you may rinse off the CHG soap.  Pat dry with a clean towel  Put on clean pajamas    Additional instructions for the day of surgery: DO NOT APPLY any lotions, deodorants, powders, cologne, or perfumes.   Do not wear jewelry or makeup Do not wear nail polish, gel polish, artificial nails, or any other type of covering on natural nails (fingers and toes) Do not bring valuables to the hospital. [facility] is not responsible for valuables/personal belongings. Put on clean/comfortable clothes.  Please brush your teeth.  Ask your nurse before applying any prescription medications to the skin.

## 2024-07-14 ENCOUNTER — Encounter (HOSPITAL_COMMUNITY): Payer: Self-pay

## 2024-07-14 ENCOUNTER — Other Ambulatory Visit: Payer: Self-pay

## 2024-07-14 ENCOUNTER — Encounter (HOSPITAL_COMMUNITY)
Admission: RE | Admit: 2024-07-14 | Discharge: 2024-07-14 | Disposition: A | Discharge status: Home / Self Care | Source: Ambulatory Visit | Attending: Surgery

## 2024-07-14 VITALS — BP 125/51 | HR 85 | Temp 98.2°F | Resp 16 | Ht 65.0 in | Wt 221.0 lb

## 2024-07-14 DIAGNOSIS — J45909 Unspecified asthma, uncomplicated: Secondary | ICD-10-CM | POA: Insufficient documentation

## 2024-07-14 DIAGNOSIS — D0512 Intraductal carcinoma in situ of left breast: Secondary | ICD-10-CM | POA: Diagnosis not present

## 2024-07-14 DIAGNOSIS — I10 Essential (primary) hypertension: Secondary | ICD-10-CM | POA: Diagnosis not present

## 2024-07-14 DIAGNOSIS — Z01818 Encounter for other preprocedural examination: Secondary | ICD-10-CM | POA: Insufficient documentation

## 2024-07-14 DIAGNOSIS — Z90710 Acquired absence of both cervix and uterus: Secondary | ICD-10-CM | POA: Diagnosis not present

## 2024-07-14 DIAGNOSIS — Z79899 Other long term (current) drug therapy: Secondary | ICD-10-CM | POA: Diagnosis not present

## 2024-07-14 DIAGNOSIS — C50912 Malignant neoplasm of unspecified site of left female breast: Secondary | ICD-10-CM | POA: Insufficient documentation

## 2024-07-14 DIAGNOSIS — Z9889 Other specified postprocedural states: Secondary | ICD-10-CM | POA: Insufficient documentation

## 2024-07-14 DIAGNOSIS — Z9049 Acquired absence of other specified parts of digestive tract: Secondary | ICD-10-CM | POA: Diagnosis not present

## 2024-07-14 DIAGNOSIS — K219 Gastro-esophageal reflux disease without esophagitis: Secondary | ICD-10-CM | POA: Diagnosis not present

## 2024-07-14 DIAGNOSIS — R2232 Localized swelling, mass and lump, left upper limb: Secondary | ICD-10-CM | POA: Insufficient documentation

## 2024-07-14 LAB — CBC
HCT: 42.4 % (ref 36.0–46.0)
Hemoglobin: 13.9 g/dL (ref 12.0–15.0)
MCH: 29 pg (ref 26.0–34.0)
MCHC: 32.8 g/dL (ref 30.0–36.0)
MCV: 88.3 fL (ref 80.0–100.0)
Platelets: 263 K/uL (ref 150–400)
RBC: 4.8 MIL/uL (ref 3.87–5.11)
RDW: 14.9 % (ref 11.5–15.5)
WBC: 10.6 K/uL — ABNORMAL HIGH (ref 4.0–10.5)
nRBC: 0 % (ref 0.0–0.2)

## 2024-07-14 LAB — BASIC METABOLIC PANEL WITH GFR
Anion gap: 7 (ref 5–15)
BUN: 14 mg/dL (ref 8–23)
CO2: 26 mmol/L (ref 22–32)
Calcium: 8.8 mg/dL — ABNORMAL LOW (ref 8.9–10.3)
Chloride: 105 mmol/L (ref 98–111)
Creatinine, Ser: 0.84 mg/dL (ref 0.44–1.00)
GFR, Estimated: >60 mL/min (ref >60)
Glucose, Bld: 103 mg/dL — ABNORMAL HIGH (ref 70–99)
Potassium: 4.3 mmol/L (ref 3.5–5.1)
Sodium: 138 mmol/L (ref 135–145)

## 2024-07-14 NOTE — Progress Notes (Signed)
 PCP - Almarie Scala Cardiologist - Denies  PPM/ICD - Denies Device Orders - n/a Rep Notified -n/a   Chest x-ray - n/a EKG - 07-14-24 Stress Test - Denies ECHO - Denies Cardiac Cath - Denies  Sleep Study - Denies CPAP - n/a  NON-diabetic  Last dose of GLP1 agonist-  Denies GLP1 instructions: n/a  Blood Thinner Instructions:Denies Aspirin Instructions:Denies  ERAS Protcol - Clears until 1000 PRE-SURGERY Ensure or G2- Ensure  COVID TEST- n/a   Anesthesia review: Yes, Seed placement BCG 07-15-24 @ 9:30, HTN, w/new EKG, Asthma  Patient denies shortness of breath, fever, cough and chest pain at PAT appointment. Patient denies any respiratory issues at this time.   All instructions explained to the patient, with a verbal understanding of the material. Patient agrees to go over the instructions while at home for a better understanding. Patient also instructed to self quarantine after being tested for COVID-19. The opportunity to ask questions was provided.

## 2024-07-15 ENCOUNTER — Ambulatory Visit
Admission: RE | Admit: 2024-07-15 | Discharge: 2024-07-15 | Disposition: A | Discharge status: Home / Self Care | Source: Ambulatory Visit | Attending: Surgery | Admitting: Surgery

## 2024-07-15 ENCOUNTER — Other Ambulatory Visit: Payer: Self-pay | Admitting: Surgery

## 2024-07-15 DIAGNOSIS — Z853 Personal history of malignant neoplasm of breast: Secondary | ICD-10-CM

## 2024-07-15 DIAGNOSIS — C50212 Malignant neoplasm of upper-inner quadrant of left female breast: Secondary | ICD-10-CM | POA: Diagnosis not present

## 2024-07-15 DIAGNOSIS — R921 Mammographic calcification found on diagnostic imaging of breast: Secondary | ICD-10-CM | POA: Diagnosis not present

## 2024-07-15 DIAGNOSIS — D0512 Intraductal carcinoma in situ of left breast: Secondary | ICD-10-CM | POA: Diagnosis not present

## 2024-07-15 HISTORY — PX: BREAST BIOPSY: SHX20

## 2024-07-15 NOTE — Progress Notes (Signed)
 Anesthesia Chart Review:  Case: [id number] Date/Time: 07/17/24 1245   Procedures:      BREAST LUMPECTOMY WITH RADIOACTIVE SEED AND SENTINEL LYMPH NODE BIOPSY (Left: Breast) - RADIOACTIVE SEED GUIDED LEFT BREAST BRACKETED LUMPECTOMY AND SENTINEL NODE BIOPSY     EXCISION, MASS, UPPER EXTREMITY (Left: Shoulder) - EXCISION LEFT SHOULDER MASS   Anesthesia type: General   Pre-op diagnosis:      LEFT BREAST CANCER     LEFT SHOULDER MASS   [facility] ROOM 02 / [facility] OR   Surgeons: Vernetta Berg, MD       DISCUSSION: Patient is a 63 year old female scheduled for the above procedure. She is also scheduled for mammoplasty, reduction by Dr. Earlis Ranks on 07/25/2024 at the [facility].  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   Preoperative labs and EKG appear acceptable for OR. She denied chest pain, SOB, cough, fever, recent URI at PAT RN visit.   RSL scheduled for 07/15/2024. Anesthesia team to evaluate on the day of surgery.   VS: BP (!) 125/51   Pulse 85   Temp 36.8 C   Resp 16   Ht 5' 5 (1.651 m)   Wt 100.2 kg   SpO2 98%   BMI 36.78 kg/m   PROVIDERS: PCP is documented by PAT RN as Dr. Almarie Waylan Loretha Amber, MD is HEM-ONC Izell Domino, MD is RAD-ONC  LABS: Labs reviewed: Acceptable for surgery. LFTs normal 06/11/2024.  (all labs ordered are listed, but only abnormal results are displayed)  Labs Reviewed  CBC - Abnormal; Notable for the following components:      Result Value   WBC 10.6 (*)    All other components within normal limits  BASIC METABOLIC PANEL WITH GFR - Abnormal; Notable for the following components:   Glucose, Bld 103 (*)    Calcium 8.8 (*)    All other components within normal limits     IMAGES: CXR 08/17/2023: FINDINGS: The heart size and mediastinal contours are within normal limits. Both lungs are clear. The visualized skeletal  structures are unremarkable. IMPRESSION: No active cardiopulmonary disease.    EKG: 07/14/2024: Normal sinus rhythm Poor anterior R wave progression No previous ECGs available Confirmed by Lavona Agent ([id number]) on 07/14/2024 7:51:14 PM  CV: N/A  Past Medical History:  Diagnosis Date   Asthma    Bronchitis    COVID-19 08/2021   Hypertension    Pneumonia     Past Surgical History:  Procedure Laterality Date   ABDOMINAL HYSTERECTOMY  2010   and left ovaries;   BREAST BIOPSY     BREAST BIOPSY Left 06/04/2024   MM LT BREAST BX W LOC DEV 1ST LESION IMAGE BX SPEC STEREO GUIDE 06/04/2024 [facility] MAMMOGRAPHY   BREAST BIOPSY Left 06/04/2024   MM LT BREAST BX W LOC DEV EA AD LESION IMG BX SPEC STEREO GUIDE 06/04/2024 [facility] MAM[facility]   BREAST EXCISIONAL BIOPSY Left    BREAST SURGERY     CHOLECYSTECTOMY N/A 02/18/2022   Procedure: LAPAROSCOPIC CHOLECYSTECTOMY;  Surgeon: Signe Mitzie LABOR, MD;  Location: [facility] OR;  Service: General;  Laterality: N/A;   HYSTEROTOMY      MEDICATIONS:  acetaminophen  (TYLENOL ) 500 MG tablet   albuterol  (PROVENTIL ) (2.5 MG/3ML) 0.083% nebulizer solution   albuterol  (VENTOLIN  HFA) 108 (90 Base) MCG/ACT inhaler   amLODipine (NORVASC) 5 MG tablet   busPIRone (BUSPAR) 10 MG tablet   Ibuprofen 200 MG  CAPS   omega-3 acid ethyl esters (LOVAZA) 1 g capsule   rosuvastatin (CRESTOR) 10 MG tablet   tretinoin (RETIN-A) 0.1 % cream   No current facility-administered medications for this encounter.    Isaiah Ruder, PA-C Surgical Short Stay/Anesthesiology [facility] Phone [phone] [facility] Phone [phone] 07/15/2024 9:40 AM

## 2024-07-15 NOTE — Anesthesia Preprocedure Evaluation (Signed)
 Anesthesia Evaluation  Patient identified by MRN, date of birth, ID band Patient awake    Reviewed: Allergy & Precautions, NPO status , Patient's Chart, lab work & pertinent test results  History of Anesthesia Complications Negative for: history of anesthetic complications  Airway Mallampati: II  TM Distance: >3 FB Neck ROM: Full    Dental  (+) Dental Advisory Given   Pulmonary asthma    Pulmonary exam normal        Cardiovascular hypertension, Pt. on medications Normal cardiovascular exam     Neuro/Psych negative neurological ROS  negative psych ROS   GI/Hepatic Neg liver ROS,GERD  Controlled,,  Endo/Other   Obesity   Renal/GU negative Renal ROS     Musculoskeletal negative musculoskeletal ROS (+)    Abdominal  (+) + obese  Peds  Hematology negative hematology ROS (+)   Anesthesia Other Findings   Reproductive/Obstetrics  Breast cancer                               Anesthesia Physical Anesthesia Plan  ASA: 2  Anesthesia Plan: General   Post-op Pain Management: Tylenol  PO (pre-op)* and Regional block*   Induction: Intravenous  PONV Risk Score and Plan: 3 and Treatment may vary due to age or medical condition, Ondansetron , Dexamethasone , Midazolam  and Scopolamine  patch - Pre-op  Airway Management Planned: LMA  Additional Equipment: None  Intra-op Plan:   Post-operative Plan: Extubation in OR  Informed Consent: I have reviewed the patients History and Physical, chart, labs and discussed the procedure including the risks, benefits and alternatives for the proposed anesthesia with the patient or authorized representative who has indicated his/her understanding and acceptance.     Dental advisory given  Plan Discussed with: CRNA and Anesthesiologist  Anesthesia Plan Comments: (PAT note written 07/15/2024 by Koby Pickup, PA-C.  )         Anesthesia Quick  Evaluation

## 2024-07-16 NOTE — H&P (Signed)
 REFERRING PHYSICIAN: Loretha Linnette Lane Mamie* PROVIDER: VICENTA DASIE POLI, MD MRN: [id number] DOB: 15-Oct-1961  Subjective   Chief Complaint: Breast Cancer  History of Present Illness: Mary Mendez is a 63 y.o. female who is seen as an office consultation for evaluation of Breast Cancer  This is a 63 year old female who was found to have an asymmetry in the left breast on screening mammography. She underwent further imaging and had multiple findings including a 5 mm mass in the breast in the upper inner quadrant and then the slightly outer quadrant had a 2.5 cm area of calcifications. Ultrasound the axilla was negative. 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 The calcifications showed ductal carcinoma in situ. She has had a previous benign biopsy many years ago in the left breast. There is a family history of breast cancer. She is otherwise healthy and without complaints. She denies nipple discharge.  Review of Systems: A complete review of systems was obtained from the patient. I have reviewed this information and discussed as appropriate with the patient. See HPI as well for other ROS.  ROS   Medical History: Past Medical History:  Diagnosis Date  Asthma, unspecified asthma severity, unspecified whether complicated, unspecified whether persistent (HHS-HCC)  History of cancer  Hyperlipidemia   Patient Active Problem List  Diagnosis  Asthmatic bronchitis (HHS-HCC)  Elevated blood-pressure reading without diagnosis of hypertension  Malignant neoplasm of upper-inner quadrant of left breast in female, estrogen receptor positive (CMS/HHS-HCC)   Past Surgical History:  Procedure Laterality Date  Breast biopsy (Left) Left 06/04/2024  Breast biopsy (Left) Left 06/04/2024  .breast biopsy N/A  Date Unknown  .Hysterotomy N/A  Date Unknown  ABDOMINAL HYSTERECTOMY N/A  Date  Unknown  Breast excisional biopsy (Left) Left  Date Unknown  breast surgery N/A  Date Unknown  CHOLECYSTECTOMY  LAPAROSCOPIC CHOLECYSTECTOMY    Not on File  Current Outpatient Medications on File Prior to Visit  Medication Sig Dispense Refill  acetaminophen  (TYLENOL ) 500 MG tablet Take 1,000 mg by mouth every 6 (six) hours as needed for Pain  albuterol  (PROVENTIL ) 2.5 mg /3 mL (0.083 %) nebulizer solution Take 2.5 mg by nebulization every 6 (six) hours as needed for Wheezing or Shortness of Breath  albuterol  MDI, PROVENTIL , VENTOLIN , PROAIR , HFA 90 mcg/actuation inhaler 2 inhalations every 6 (six) hours as needed for Wheezing or Shortness of Breath  amLODIPine (NORVASC) 10 MG tablet Take 10 mg by mouth once daily  benzonatate (TESSALON) 200 MG capsule Take 200 mg by mouth 3 (three) times daily as needed for Cough  busPIRone (BUSPAR) 10 MG tablet Take 10 mg by mouth 3 (three) times daily as needed  doxycycline (VIBRAMYCIN) 100 MG capsule  fluticasone propion-salmeteroL (ADVAIR DISKUS) 250-50 mcg/dose diskus inhaler Inhale 1 Puff into the lungs every 12 (twelve) hours  ibuprofen (MOTRIN) 200 MG tablet Take 400 mg by mouth every 6 (six) hours as needed for Pain (headache)  ibuprofen 200 mg Cap Take 200 mg by mouth every 8 (eight) hours as needed (pain)  lisinopriL (ZESTRIL) 5 MG tablet Take 5 mg by mouth once daily  meloxicam (MOBIC) 15 MG tablet Take 15 mg by mouth once daily  metroNIDAZOLE (FLAGYL) 500 MG tablet  naproxen (NAPROSYN) 500 MG tablet Take 500 mg by mouth 2 (two) times daily with meals  naproxen sodium 750 mg TM24  rosuvastatin (CRESTOR) 10 MG tablet  Take 10 mg by mouth once daily  tretinoin (RETIN-A) 0.05 % cream  tretinoin (RETIN-A) 0.1 % cream Apply 1 Application topically at bedtime as needed (apply to face)  WEGOVY 1 mg/0.5 mL pen injector Inject 1 mg subcutaneously once a week   No current facility-administered medications on file prior to visit.   Family History   Problem Relation Age of Onset  Breast cancer Mother  Skin cancer Father  High blood pressure (Hypertension) Sister  Skin cancer Sister    Social History   Tobacco Use  Smoking Status Never  Smokeless Tobacco Never    Social History   Socioeconomic History  Marital status: Married  Tobacco Use  Smoking status: Never  Smokeless tobacco: Never  Vaping Use  Vaping status: Never Used  Substance and Sexual Activity  Alcohol use: Yes  Drug use: Never   Objective:  There were no vitals filed for this visit.  There is no height or weight on file to calculate BMI.  Physical Exam   She appears well on exam.  She has large breasts bilaterally. There is minimal ecchymosis on the left breast biopsy. There are no palpable masses. The nipple areolar complexes are normal. There is no axillary lymphadenopathy  There is a 5 cm mass laterally on her left shoulder which is mobile consistent with a lipoma  Labs, Imaging and Diagnostic Testing: I have reviewed her mammograms, ultrasound, and pathology results  Assessment and Plan:   Diagnoses and all orders for this visit:  Invasive ductal carcinoma of breast, left (CMS/HHS-HCC)  Ductal carcinoma in situ (DCIS) of left breast  5 CM left shoulder mass   This is a patient with both invasive ductal carcinoma and DCIS of the left breast. We have discussed her multidisciplinary breast cancer conference this morning. From a surgical standpoint, we discussed breast conservation versus mastectomy. The 2 separate areas biopsied are 5.3 cm apart. An MRI strongly recommended of her breast to evaluate the extent of disease. this would help determine whether or not she is a candidate for breast conservation. She is actually interested in considering oncoplastic reduction which I believe would be very beneficial given the size of her breast. We also discussed the time of surgery doing sentinel lymph node biopsy in the left axilla and the reasonings  for this. I explained the surgical procedure in detail as well. At this point we will refer her to plastic surgery regarding the reduction and have ordered the MRI. I will call her back with the MRI and we will determine how to proceed. She will also be undergoing genetic testing. All this will again we will then decide the surgical approach. All questions were answered. We would also consider removal of the shoulder mass at the same time of surgery.  Addendum: MRI showed the total size of the area of IDC and DCIS to be almost 4 cm. She has seen plastic surgery and wants an oncoplastic reduction.  Will plan on proceeding with a bracketed left breast lumpectomy and sentinel node biopsy followed by oncoplastic reduction the following week depending on the margins.  We will also remove the left shoulder mass at surgery .She agrees with the plans/  We discussed the risks which includes but is not limited to bleeding, infection, injury to surrounding structures, the need for further surgery if nodes or margins are positive, cardiopulmonary issues with surgery, blood clots, post op recovery, etc.

## 2024-07-17 ENCOUNTER — Ambulatory Visit (HOSPITAL_COMMUNITY): Payer: Self-pay | Admitting: Vascular Surgery

## 2024-07-17 ENCOUNTER — Ambulatory Visit
Admission: RE | Admit: 2024-07-17 | Discharge: 2024-07-17 | Disposition: A | Discharge status: Home / Self Care | Source: Ambulatory Visit | Attending: Surgery | Admitting: Surgery

## 2024-07-17 ENCOUNTER — Encounter (HOSPITAL_COMMUNITY): Payer: Self-pay | Admitting: Surgery

## 2024-07-17 ENCOUNTER — Other Ambulatory Visit: Payer: Self-pay

## 2024-07-17 ENCOUNTER — Ambulatory Visit (HOSPITAL_COMMUNITY)
Admission: RE | Admit: 2024-07-17 | Discharge: 2024-07-17 | Disposition: A | Discharge status: Home / Self Care | Attending: Surgery | Admitting: Surgery

## 2024-07-17 ENCOUNTER — Encounter (HOSPITAL_COMMUNITY)
Admission: RE | Disposition: A | Discharge status: Home / Self Care | Payer: Self-pay | Source: Home / Self Care | Attending: Surgery

## 2024-07-17 DIAGNOSIS — Z170 Estrogen receptor positive status [ER+]: Secondary | ICD-10-CM | POA: Diagnosis not present

## 2024-07-17 DIAGNOSIS — Z853 Personal history of malignant neoplasm of breast: Secondary | ICD-10-CM

## 2024-07-17 DIAGNOSIS — N6012 Diffuse cystic mastopathy of left breast: Secondary | ICD-10-CM | POA: Diagnosis not present

## 2024-07-17 DIAGNOSIS — D0512 Intraductal carcinoma in situ of left breast: Secondary | ICD-10-CM | POA: Insufficient documentation

## 2024-07-17 DIAGNOSIS — D171 Benign lipomatous neoplasm of skin and subcutaneous tissue of trunk: Secondary | ICD-10-CM | POA: Diagnosis not present

## 2024-07-17 DIAGNOSIS — K219 Gastro-esophageal reflux disease without esophagitis: Secondary | ICD-10-CM | POA: Insufficient documentation

## 2024-07-17 DIAGNOSIS — Z9049 Acquired absence of other specified parts of digestive tract: Secondary | ICD-10-CM | POA: Insufficient documentation

## 2024-07-17 DIAGNOSIS — G8918 Other acute postprocedural pain: Secondary | ICD-10-CM | POA: Diagnosis not present

## 2024-07-17 DIAGNOSIS — Z79899 Other long term (current) drug therapy: Secondary | ICD-10-CM | POA: Insufficient documentation

## 2024-07-17 DIAGNOSIS — J45909 Unspecified asthma, uncomplicated: Secondary | ICD-10-CM | POA: Insufficient documentation

## 2024-07-17 DIAGNOSIS — I10 Essential (primary) hypertension: Secondary | ICD-10-CM | POA: Insufficient documentation

## 2024-07-17 DIAGNOSIS — N641 Fat necrosis of breast: Secondary | ICD-10-CM | POA: Diagnosis not present

## 2024-07-17 DIAGNOSIS — Z90710 Acquired absence of both cervix and uterus: Secondary | ICD-10-CM | POA: Insufficient documentation

## 2024-07-17 HISTORY — PX: EXCISION, MASS, UPPER EXTREMITY: SHX7567

## 2024-07-17 HISTORY — PX: BREAST LUMPECTOMY WITH RADIOACTIVE SEED AND SENTINEL LYMPH NODE BIOPSY: SHX6550

## 2024-07-17 SURGERY — BREAST LUMPECTOMY WITH RADIOACTIVE SEED AND SENTINEL LYMPH NODE BIOPSY
Anesthesia: General | Site: Shoulder | Laterality: Left

## 2024-07-17 MED ORDER — LACTATED RINGERS IV SOLN
INTRAVENOUS | Status: DC
Start: 2024-07-17 — End: 2024-07-17

## 2024-07-17 MED ORDER — CEFAZOLIN SODIUM-DEXTROSE 2-4 GM/100ML-% IV SOLN
INTRAVENOUS | Status: AC
  Filled 2024-07-17: qty 100

## 2024-07-17 MED ORDER — PROPOFOL 10 MG/ML IV BOLUS
INTRAVENOUS | Status: DC | PRN
  Administered 2024-07-17: 150 mg via INTRAVENOUS
  Administered 2024-07-17: 50 mg via INTRAVENOUS

## 2024-07-17 MED ORDER — CHLORHEXIDINE GLUCONATE CLOTH 2 % EX PADS: 6.0000 | MEDICATED_PAD | Freq: Once | CUTANEOUS | Status: DC

## 2024-07-17 MED ORDER — BUPIVACAINE-EPINEPHRINE (PF) 0.5% -1:200000 IJ SOLN
INTRAMUSCULAR | Status: DC | PRN
Start: 2024-07-17 — End: 2024-07-17
  Administered 2024-07-17: 30 mL

## 2024-07-17 MED ORDER — FENTANYL CITRATE (PF) 100 MCG/2ML IJ SOLN
25.0000 ug | INTRAMUSCULAR | Status: DC | PRN
  Administered 2024-07-17: 50 ug via INTRAVENOUS

## 2024-07-17 MED ORDER — 0.9 % SODIUM CHLORIDE (POUR BTL) OPTIME
TOPICAL | Status: DC | PRN
  Administered 2024-07-17: 1000 mL

## 2024-07-17 MED ORDER — ACETAMINOPHEN 500 MG PO TABS
ORAL_TABLET | ORAL | Status: AC
  Administered 2024-07-17: 1000 mg via ORAL
  Filled 2024-07-17: qty 2

## 2024-07-17 MED ORDER — FENTANYL CITRATE (PF) 250 MCG/5ML IJ SOLN
INTRAMUSCULAR | Status: AC
  Filled 2024-07-17: qty 5

## 2024-07-17 MED ORDER — MAGTRACE LYMPHATIC TRACER
INTRAMUSCULAR | Status: DC | PRN
  Administered 2024-07-17: 1 mL via INTRAMUSCULAR

## 2024-07-17 MED ORDER — MIDAZOLAM HCL 2 MG/2ML IJ SOLN
INTRAMUSCULAR | Status: AC
Start: 2024-07-17 — End: 2024-07-17
  Filled 2024-07-17: qty 2

## 2024-07-17 MED ORDER — PHENYLEPHRINE 80 MCG/ML (10ML) SYRINGE FOR IV PUSH (FOR BLOOD PRESSURE SUPPORT)
PREFILLED_SYRINGE | INTRAVENOUS | Status: DC | PRN
  Administered 2024-07-17 (×2): 160 ug via INTRAVENOUS

## 2024-07-17 MED ORDER — PROPOFOL 10 MG/ML IV BOLUS
INTRAVENOUS | Status: AC
Start: 2024-07-17 — End: 2024-07-17
  Filled 2024-07-17: qty 20

## 2024-07-17 MED ORDER — MIDAZOLAM HCL 2 MG/2ML IJ SOLN
INTRAMUSCULAR | Status: DC | PRN
  Administered 2024-07-17: 2 mg via INTRAVENOUS

## 2024-07-17 MED ORDER — FENTANYL CITRATE PF 50 MCG/ML IJ SOSY
50.0000 ug | PREFILLED_SYRINGE | Freq: Once | INTRAMUSCULAR | Status: AC
  Administered 2024-07-17: 50 ug via INTRAVENOUS
  Filled 2024-07-17: qty 1

## 2024-07-17 MED ORDER — ONDANSETRON HCL 4 MG/2ML IJ SOLN
INTRAMUSCULAR | Status: DC | PRN
  Administered 2024-07-17: 4 mg via INTRAVENOUS

## 2024-07-17 MED ORDER — ORAL CARE MOUTH RINSE: 15.0000 mL | Freq: Once | OROMUCOSAL | Status: AC

## 2024-07-17 MED ORDER — ENSURE PRE-SURGERY PO LIQD: 296.0000 mL | Freq: Once | ORAL | Status: DC

## 2024-07-17 MED ORDER — DEXAMETHASONE SODIUM PHOSPHATE 10 MG/ML IJ SOLN
INTRAMUSCULAR | Status: DC | PRN
  Administered 2024-07-17: 10 mg via INTRAVENOUS

## 2024-07-17 MED ORDER — OXYCODONE HCL 5 MG PO TABS: 5.0000 mg | ORAL_TABLET | Freq: Four times a day (QID) | ORAL | 0 refills | Status: DC | PRN

## 2024-07-17 MED ORDER — LIDOCAINE 2% (20 MG/ML) 5 ML SYRINGE
INTRAMUSCULAR | Status: DC | PRN
  Administered 2024-07-17: 40 mg via INTRAVENOUS

## 2024-07-17 MED ORDER — BUPIVACAINE-EPINEPHRINE 0.25% -1:200000 IJ SOLN
INTRAMUSCULAR | Status: DC | PRN
  Administered 2024-07-17: 24 mL

## 2024-07-17 MED ORDER — CHLORHEXIDINE GLUCONATE 0.12 % MT SOLN: 15.0000 mL | Freq: Once | OROMUCOSAL | Status: AC

## 2024-07-17 MED ORDER — CEFAZOLIN SODIUM-DEXTROSE 2-4 GM/100ML-% IV SOLN
2.0000 g | INTRAVENOUS | Status: AC
  Administered 2024-07-17: 2 g via INTRAVENOUS

## 2024-07-17 MED ORDER — FENTANYL CITRATE (PF) 100 MCG/2ML IJ SOLN
INTRAMUSCULAR | Status: AC
  Filled 2024-07-17: qty 2

## 2024-07-17 MED ORDER — CHLORHEXIDINE GLUCONATE 0.12 % MT SOLN
OROMUCOSAL | Status: AC
  Administered 2024-07-17: 15 mL via OROMUCOSAL
  Filled 2024-07-17: qty 15

## 2024-07-17 MED ORDER — FENTANYL CITRATE (PF) 250 MCG/5ML IJ SOLN
INTRAMUSCULAR | Status: DC | PRN
  Administered 2024-07-17: 25 ug via INTRAVENOUS
  Administered 2024-07-17 (×4): 50 ug via INTRAVENOUS
  Administered 2024-07-17: 25 ug via INTRAVENOUS

## 2024-07-17 MED ORDER — ACETAMINOPHEN 500 MG PO TABS
1000.0000 mg | ORAL_TABLET | ORAL | Status: AC
Start: 2024-07-17 — End: 2024-07-17

## 2024-07-17 SURGICAL SUPPLY — 44 items
BAG COUNTER SPONGE SURGICOUNT (BAG) ×3 IMPLANT
BAND RUBBER 3X1/6 STRL (MISCELLANEOUS) IMPLANT
BINDER BREAST LRG (GAUZE/BANDAGES/DRESSINGS) IMPLANT
BINDER BREAST XLRG (GAUZE/BANDAGES/DRESSINGS) IMPLANT
CANISTER SUCTION 3000ML PPV (SUCTIONS) ×3 IMPLANT
CHLORAPREP W/TINT 26 (MISCELLANEOUS) ×3 IMPLANT
CLIP APPLIE 9.375 MED OPEN (MISCELLANEOUS) ×3 IMPLANT
CNTNR URN SCR LID CUP LEK RST (MISCELLANEOUS) IMPLANT
COVER PROBE W GEL 5X96 (DRAPES) ×6 IMPLANT
COVER SURGICAL LIGHT HANDLE (MISCELLANEOUS) ×3 IMPLANT
DERMABOND ADVANCED .7 DNX12 (GAUZE/BANDAGES/DRESSINGS) ×3 IMPLANT
DEVICE DUBIN SPECIMEN MAMMOGRA (MISCELLANEOUS) ×3 IMPLANT
DRAPE CHEST BREAST 15X10 FENES (DRAPES) ×3 IMPLANT
DRAPE LAPAROSCOPIC ABDOMINAL (DRAPES) IMPLANT
DRAPE LAPAROTOMY 100X72 PEDS (DRAPES) IMPLANT
DRSG TEGADERM 4X4.75 (GAUZE/BANDAGES/DRESSINGS) ×3 IMPLANT
ELECT CAUTERY BLADE 6.4 (BLADE) ×3 IMPLANT
ELECTRODE REM PT RTRN 9FT ADLT (ELECTROSURGICAL) ×3 IMPLANT
GAUZE SPONGE 4X4 12PLY STRL (GAUZE/BANDAGES/DRESSINGS) ×3 IMPLANT
GLOVE SURG SIGNA 7.5 PF LTX (GLOVE) ×3 IMPLANT
GOWN STRL REUS W/ TWL LRG LVL3 (GOWN DISPOSABLE) ×3 IMPLANT
GOWN STRL REUS W/ TWL XL LVL3 (GOWN DISPOSABLE) ×3 IMPLANT
KIT BASIN OR (CUSTOM PROCEDURE TRAY) ×3 IMPLANT
KIT MARKER MARGIN INK (KITS) ×3 IMPLANT
KIT TURNOVER KIT B (KITS) ×3 IMPLANT
NDL 18GX1X1/2 (RX/OR ONLY) (NEEDLE) IMPLANT
NDL FILTER BLUNT 18X1 1/2 (NEEDLE) IMPLANT
NDL HYPO 25GX1X1/2 BEV (NEEDLE) ×3 IMPLANT
NEEDLE 18GX1X1/2 (RX/OR ONLY) (NEEDLE) ×2 IMPLANT
NEEDLE FILTER BLUNT 18X1 1/2 (NEEDLE) ×2 IMPLANT
NEEDLE HYPO 25GX1X1/2 BEV (NEEDLE) ×4 IMPLANT
NS IRRIG 1000ML POUR BTL (IV SOLUTION) ×3 IMPLANT
PACK GENERAL/GYN (CUSTOM PROCEDURE TRAY) ×3 IMPLANT
PAD ARMBOARD POSITIONER FOAM (MISCELLANEOUS) ×3 IMPLANT
PENCIL SMOKE EVACUATOR (MISCELLANEOUS) ×3 IMPLANT
SPECIMEN JAR SMALL (MISCELLANEOUS) ×3 IMPLANT
SUT MNCRL AB 4-0 PS2 18 (SUTURE) ×3 IMPLANT
SUT SILK 2 0 SH (SUTURE) IMPLANT
SUT VIC AB 3-0 SH 18 (SUTURE) ×3 IMPLANT
SUT VIC AB 3-0 SH 27XBRD (SUTURE) ×3 IMPLANT
SYR 3ML LL SCALE MARK (SYRINGE) IMPLANT
SYR CONTROL 10ML LL (SYRINGE) ×3 IMPLANT
TOWEL GREEN STERILE (TOWEL DISPOSABLE) ×3 IMPLANT
TOWEL GREEN STERILE FF (TOWEL DISPOSABLE) ×3 IMPLANT

## 2024-07-17 NOTE — Interval H&P Note (Signed)
 History and Physical Interval Note:no change in H and P  07/17/2024 11:12 AM  Mary Mendez  has presented today for surgery, with the diagnosis of LEFT BREAST CANCER LEFT SHOULDER MASS.  The various methods of treatment have been discussed with the patient and family. After consideration of risks, benefits and other options for treatment, the patient has consented to  Procedure(s) with comments: BREAST LUMPECTOMY WITH RADIOACTIVE SEED AND SENTINEL LYMPH NODE BIOPSY (Left) - RADIOACTIVE SEED GUIDED LEFT BREAST BRACKETED LUMPECTOMY AND SENTINEL NODE BIOPSY EXCISION, MASS, UPPER EXTREMITY (Left) - EXCISION LEFT SHOULDER MASS as a surgical intervention.  The patient's history has been reviewed, patient examined, no change in status, stable for surgery.  I have reviewed the patient's chart and labs.  Questions were answered to the patient's satisfaction.     Vicenta Poli

## 2024-07-17 NOTE — Anesthesia Procedure Notes (Signed)
 Anesthesia Regional Block: Pectoralis block   Pre-Anesthetic Checklist: , timeout performed,  Correct Patient, Correct Site, Correct Laterality,  Correct Procedure, Correct Position, site marked,  Risks and benefits discussed,  Surgical consent,  Pre-op evaluation,  At surgeon's request and post-op pain management  Laterality: Left  Prep: chloraprep       Needles:  Injection technique: Single-shot  Needle Type: Echogenic Needle     Needle Length: 10cm  Needle Gauge: 21     Additional Needles:   Narrative:  Start time: 07/17/2024 11:24 AM End time: 07/17/2024 11:27 AM Injection made incrementally with aspirations every 5 mL.  Performed by: Personally  Anesthesiologist: Lucious Debby BRAVO, MD  Additional Notes: No pain on injection. No increased resistance to injection. Injection made in 5cc increments. Good needle visualization. Patient tolerated the procedure well.

## 2024-07-17 NOTE — Transfer of Care (Signed)
 Immediate Anesthesia Transfer of Care Note  Patient: Mary Mendez  Procedure(s) Performed: BREAST LUMPECTOMY WITH RADIOACTIVE SEED AND SENTINEL LYMPH NODE BIOPSY (Left: Breast) EXCISION, MASS, UPPER EXTREMITY (Left: Shoulder)  Patient Location: PACU  Anesthesia Type:General  Level of Consciousness: awake, alert , and oriented  Airway & Oxygen Therapy: Patient Spontanous Breathing and Patient connected to nasal cannula oxygen  Post-op Assessment: Report given to RN and Post -op Vital signs reviewed and stable  Post vital signs: Reviewed and stable  Last Vitals:  Vitals Value Taken Time  BP 142/83 07/17/24 13:48  Temp    Pulse 89 07/17/24 13:52  Resp 18 07/17/24 13:52  SpO2 96 % 07/17/24 13:52  Vitals shown include unfiled device data.  Last Pain:  Vitals:   07/17/24 1047  TempSrc: Oral  PainSc: 0-No pain         Complications: No notable events documented.

## 2024-07-17 NOTE — Anesthesia Procedure Notes (Signed)
 Procedure Name: LMA Insertion Date/Time: 07/17/2024 12:01 PM  Performed by: Cindie Donald CROME, CRNAPre-anesthesia Checklist: Patient identified, Emergency Drugs available, Suction available and Patient being monitored Patient Re-evaluated:Patient Re-evaluated prior to induction Oxygen Delivery Method: Circle System Utilized Preoxygenation: Pre-oxygenation with 100% oxygen Induction Type: IV induction Ventilation: Mask ventilation without difficulty LMA: LMA inserted LMA Size: 4.0 Number of attempts: 1 Airway Equipment and Method: Bite block Placement Confirmation: positive ETCO2 Tube secured with: Tape Dental Injury: Teeth and Oropharynx as per pre-operative assessment

## 2024-07-17 NOTE — Op Note (Signed)
 Mary Mendez 07/17/2024   Pre-op Diagnosis: LEFT BREAST INVASIVE DUCTAL CARCINOMA AND DCIS, 5 CM LEFT SHOULDER MASS     Post-op Diagnosis: SAME  Procedure(s): RADIOACTIVE SEED GUIDED LEFT BREAST BRACKETED LUMPECTOMY X 2 DEEP LEFT AXILLARY SENTINEL LYMPH NODE BIOPSY EXCISION 5 CM LEFT SHOULDER MASS INJECTION OF MAG TRACE FOR LYMPH NODE MAPPING  Surgeon(s): Vernetta Berg, MD  Anesthesia: General  Staff:  Circulator: Bari Birmingham, RN Relief Circulator: Tharon Selinda GAILS, RN Scrub Person: Starla Duwaine BROCKS, RN  Estimated Blood Loss: Minimal               Specimens: lum indications: Pectomy containing 3 seeds (DCIS) and lumpectomy containing 1 seed (ICD) as well as sentinel nodes sent to path  Indications: This is a 63 year old female who was found to have a left breast mass as well as left breast calcifications in 2 separate areas on screening mammography and further imaging.  She underwent a biopsy of the small mass measuring 6 mm showing invasive ductal carcinoma.  She also had an area of calcifications biopsy which showed ductal carcinoma in situ.  Follow-up MRI showed that the breast cancer in the upper inner quadrant measure approximately 6 mm.  In a separate area in the more central breast was an almost 4 cm area of calcifications corresponding to the DCIS.  I decision was made to proceed with a bracketed radioactive seed guided lumpectomy for the DCIS as well as a lumpectomy for the breast cancer followed by sentinel lymph node biopsy and then excision of a 5 cm lipoma on her lateral deltoid.  Procedure: The patient was brought to the operating identified the correct patient.  She was placed upon the operating table and general anesthesia was induced.  I next under sterile technique injected mag trace underneath the left nipple areolar complex and massaged the breast.  The patient's left breast, axilla, and shoulder were then prepped and draped in usual sterile fashion.  Using  the neoprobe I located the 3 radioactive seeds at the DCIS which were almost underneath the nipple areolar complex and just superior to this at the 12 position.  The separate radioactive seed containing the invasive ductal carcinoma was located in the upper inner quadrant at approximately 1030.  I made a transverse incision above the nipple areolar complex with a scalpel after anesthetized skin with Marcaine .  I then dissected down into the breast tissue with electrocautery.  With the aid of the neoprobe I then attempted to stay wildly around the 3 separate radioactive seeds going down to the chest wall in all locations.  I then completed the lumpectomy for the DCIS specimen with electrocautery.  I marked the large specimen with paint and then x-rayed the specimen confirming at the original biopsy clip and 3 radioactive seeds were in the specimen.  Using neoprobe I then through the same incision dissected toward the upper inner quadrant of the breast locating the radioactive seed at the area of the invasive ductal carcinoma.  I stayed widely around this area as well with electrocautery completing the lumpectomy.  I again marked all margins with paint and sent the specimen separate as the invasive cancer lumpectomy specimen.  Hemostasis was achieved at 1 vessel that was bleeding in the breast tissue with a silk suture.  I next made a skin incision in the patient's left axilla after anesthetized skin with Marcaine .  I then dissected down to the deep left axillary tissue.  With the aid of the mag  trace probe I identified what appeared to be 2 sentinel lymph nodes which are also slightly discolored as well.  These were sent to pathology en bloc together with the surrounding fat after excising them then with electrocautery and surgical clips.  No other increased uptake was identified in the axilla and there were no other palpable nodes. I next anesthetized the skin over the palpable mass in the left deltoid area with  Marcaine .  I made a longitudinal incision with a scalpel and then dissected down to the deep subcutaneous tissue and identified a 5 cm lipoma which I completely excised.  I elected not to send the lipoma to pathology.  It appeared very consistent with a soft lipoma.  I injected further Marcaine  into the incisions.  I placed surgical clips around the boundaries of the DCIS area and the invasive cancer in the left breast.  All incisions were then closed with interrupted 3-0 Vicryl sutures and running 4-0 Monocryl sutures.  Dermabond was then applied.  The patient was then placed in her breast binder.  She was then extubated in the operating room and taken in stable condition to the recovery room.          Vicenta Poli   Date: 07/17/2024  Time: 1:28 PM

## 2024-07-17 NOTE — Discharge Instructions (Signed)
[facility] Office Phone Number [phone]  BREAST BIOPSY/ PARTIAL MASTECTOMY: POST OP INSTRUCTIONS  Always review your discharge instruction sheet given to you by the facility where your surgery was performed.  IF YOU HAVE DISABILITY OR FAMILY LEAVE FORMS, YOU MUST BRING THEM TO THE OFFICE FOR PROCESSING.  DO NOT GIVE THEM TO YOUR DOCTOR.  A prescription for pain medication may be given to you upon discharge.  Take your pain medication as prescribed, if needed.  If narcotic pain medicine is not needed, then you may take acetaminophen (Tylenol) or ibuprofen (Advil) as needed. Take your usually prescribed medications unless otherwise directed If you need a refill on your pain medication, please contact your pharmacy.  They will contact our office to request authorization.  Prescriptions will not be filled after 5pm or on week-ends. You should eat very light the first 24 hours after surgery, such as soup, crackers, pudding, etc.  Resume your normal diet the day after surgery. Most patients will experience some swelling and bruising in the breast.  Ice packs and a good support bra will help.  Swelling and bruising can take several days to resolve.  It is common to experience some constipation if taking pain medication after surgery.  Increasing fluid intake and taking a stool softener will usually help or prevent this problem from occurring.  A mild laxative (Milk of Magnesia or Miralax) should be taken according to package directions if there are no bowel movements after 48 hours. Unless discharge instructions indicate otherwise, you may remove your bandages 24-48 hours after surgery, and you may shower at that time.  You may have steri-strips (small skin tapes) in place directly over the incision.  These strips should be left on the skin for 7-10 days.  If your surgeon used skin glue on the incision, you may shower in 24 hours.  The glue will flake off over the next 2-3 weeks.  Any  sutures or staples will be removed at the office during your follow-up visit. ACTIVITIES:  You may resume regular daily activities (gradually increasing) beginning the next day.  Wearing a good support bra or sports bra minimizes pain and swelling.  You may have sexual intercourse when it is comfortable. You may drive when you no longer are taking prescription pain medication, you can comfortably wear a seatbelt, and you can safely maneuver your car and apply brakes. RETURN TO WORK:  ______________________________________________________________________________________ Mary Mendez should see your doctor in the office for a follow-up appointment approximately two weeks after your surgery.  Your doctor's nurse will typically make your follow-up appointment when she calls you with your pathology report.  Expect your pathology report 2-3 business days after your surgery.  You may call to check if you do not hear from [place] after three days. OTHER INSTRUCTIONS: _YOU MAY REMOVE THE BINDER AND SHOWER STARTING TOMORROW AND THEN PUT THE BINDER BACK ON  ICE PACK, TYLENOL, AND IBUPROFEN ALSO FOR PAIN NO VIGOROUS ACTIVITY FOR ONE WEEK ______________________________________________________________________________________________ _____________________________________________________________________________________________________________________________________ _____________________________________________________________________________________________________________________________________ _____________________________________________________________________________________________________________________________________  WHEN TO CALL YOUR DOCTOR: Fever over 101.0 Nausea and/or vomiting. Extreme swelling or bruising. Continued bleeding from incision. Increased pain, redness, or drainage from the incision.  The clinic staff is available to answer your questions during regular business hours.  Please don't hesitate to  call and ask to speak to one of the nurses for clinical concerns.  If you have a medical emergency, go to the nearest emergency room or call 911.  A surgeon from [facility] is  always on call at the hospital.  For further questions, please visit [email or web address]

## 2024-07-18 ENCOUNTER — Encounter (HOSPITAL_COMMUNITY): Payer: Self-pay | Admitting: Surgery

## 2024-07-18 ENCOUNTER — Other Ambulatory Visit: Payer: Self-pay

## 2024-07-22 LAB — SURGICAL PATHOLOGY

## 2024-07-22 NOTE — H&P (Signed)
 Subjective Patient ID: Mary Mendez is a 63 y.o. female.     HPI   Presents for staged breast reconstruction. Presented following screening MMG with possible left breast asymmetry. Diagnostic MMG/US  showed 7 mm mass left breast 10 o'clock left breast 8 cmfn; a vague 4 mm shadowing lesion in the 10:30 o'clock left breast 10 cmfn; and a 2.5 cm group of calcifications in the medial left breast. No abnormal left axillary LN. Biopsies labeled upper inner left breast mass showed IDC, ER/PR+, Her2-, left breast calcifications intermediate grade DCIS.    The two areas biopsied are 5.3 cm apart. MRI demonstrated 0.6 cm mass left UIQ, consistent with biopsy-proven IDC and left UIQ 3.9 cm span NME consistent with biopsy-proven DCIS.   Patient has undergone left lumpectomy SLN with final pathology ribbon clip 1 cm grade 3 IDC margins clear, left breast X clip DCIS with necrosis margins clear, 0/3 SLN.    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 Mother with breast ca, passed away age 24.   Current DD. No weight recorded lumpectomy.   Wt- patient was on [facility] for 2 months but could not tolerated increase in dose. Plan was to start another glutide medication but this has been put on hold until done with cancer work up and treatment.   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 Lives with spouse.    Review of Systems  All other systems reviewed and are negative.    Objective Physical Exam  Cardiovascular: Normal rate, regular rhythm and normal heart sounds.    Pulmonary/Chest Effort normal and breath sounds normal.    Skin   Fitzpatrick 2     Lymph: no palpable axillary adenopathy   +shoulder grooving Breasts: no palpable masses, grade 3 ptosis bilateral Left medial breast radial scar from lumpectomy SN to nipple R 36 L 35 cm BW R 25 L 25 cm Nipple to IMF R 13 L 13 cm   Assessment/Plan Malignant neoplasm of upper-inner quadrant of  left breast in female, ER+  S/p left lumpectomy SLN     Reviewed oncoplastic reconstruction 7-10 d post lumpectomy to ensure pathologic clearance. Reviewed reduction with anchor type scars, drains, post operative visits and limitations, recovery. Diminished sensation nipple and breast skin, risk of nipple loss, wound healing problems, asymmetry. Discussed will have some contraction of breast volume and increased firmness with radiation, less ptosis with aging. This can result in asymmetries long term. Discussed changes with wt gain, loss, aging. Discussed lumpectomy alone can result in NAC displacement, distortion contour breast following lumpectomy and RT, asymmetry breast volume and NAC position. Reviewed purpose of this type reconstruction to prevent these. Reviewed breast lift or trying to correct NAC displacement post RT more difficult. Counseled I cannot assure her cup size.  Reviewed can defer surgery until after therapies complete. In this setting would wait at least 6 months from end RT for any surgery. Reviewed increased risks complications in setting RT. Reviewed any complications from oncoplastic reconstruction procedure may delay start adjuvant therapies.    Additional risks including but not limited to bleeding, infection, hematoma, seroma, need for additional procedures, damage to adjacent structures, asymmetry, unacceptable cosmetic result, blood clots in legs or lungs reviewed. Completed ASPS consent for breast reduction.    Drain teaching completed. Will provide any needed Rx or day of surgery.  Earlis Ranks, MD [facility]  Office/ physician access line after hours [phone]

## 2024-07-23 ENCOUNTER — Telehealth: Payer: Self-pay | Admitting: *Deleted

## 2024-07-23 ENCOUNTER — Encounter: Payer: Self-pay | Admitting: *Deleted

## 2024-07-23 MED ORDER — CHLORHEXIDINE GLUCONATE CLOTH 2 % EX PADS
6.0000 | MEDICATED_PAD | Freq: Once | CUTANEOUS | Status: DC
Start: 2024-07-23 — End: 2024-07-25

## 2024-07-23 MED ORDER — CHLORHEXIDINE GLUCONATE CLOTH 2 % EX PADS: 6.0000 | MEDICATED_PAD | Freq: Once | CUTANEOUS | Status: DC

## 2024-07-23 NOTE — Progress Notes (Signed)
      Enhanced Recovery after Surgery  Enhanced Recovery after Surgery is a protocol used to improve the stress on your body and your recovery after surgery.  Patient Instructions  The night before surgery:  No food after midnight. ONLY clear liquids after midnight  The day of surgery (if you do NOT have diabetes):  Drink ONE (1) Pre-Surgery Clear Ensure as directed.   This drink was given to you during your hospital  pre-op appointment visit. The pre-op nurse will instruct you on the time to drink the  Pre-Surgery Ensure depending on your surgery time. Finish the drink at the designated time by the pre-op nurse.  Nothing else to drink after completing the  Pre-Surgery Clear Ensure.  The day of surgery (if you have diabetes): Drink ONE (1) Gatorade 2 (G2) as directed. This drink was given to you during your hospital  pre-op appointment visit.  The pre-op nurse will instruct you on the time to drink the   Gatorade 2 (G2) depending on your surgery time. Color of the Gatorade may vary. Red is not allowed. Nothing else to drink after completing the  Gatorade 2 (G2).         If you have questions, please contact your surgeon's office.  Pre-surgical soap and instructions given as well.

## 2024-07-23 NOTE — Telephone Encounter (Signed)
Ordered oncotype per Dr. Iruku. Sent requisition to pathology and exact sciences. 

## 2024-07-25 ENCOUNTER — Ambulatory Visit (HOSPITAL_BASED_OUTPATIENT_CLINIC_OR_DEPARTMENT_OTHER): Admitting: Anesthesiology

## 2024-07-25 ENCOUNTER — Ambulatory Visit (HOSPITAL_BASED_OUTPATIENT_CLINIC_OR_DEPARTMENT_OTHER)
Admission: RE | Admit: 2024-07-25 | Discharge: 2024-07-25 | Disposition: A | Discharge status: Home / Self Care | Attending: Plastic Surgery | Admitting: Plastic Surgery

## 2024-07-25 ENCOUNTER — Encounter (HOSPITAL_BASED_OUTPATIENT_CLINIC_OR_DEPARTMENT_OTHER): Payer: Self-pay | Admitting: Plastic Surgery

## 2024-07-25 ENCOUNTER — Other Ambulatory Visit: Payer: Self-pay

## 2024-07-25 ENCOUNTER — Encounter (HOSPITAL_BASED_OUTPATIENT_CLINIC_OR_DEPARTMENT_OTHER)
Admission: RE | Disposition: A | Discharge status: Home / Self Care | Payer: Self-pay | Source: Home / Self Care | Attending: Plastic Surgery

## 2024-07-25 DIAGNOSIS — I10 Essential (primary) hypertension: Secondary | ICD-10-CM | POA: Diagnosis not present

## 2024-07-25 DIAGNOSIS — Z483 Aftercare following surgery for neoplasm: Secondary | ICD-10-CM | POA: Diagnosis not present

## 2024-07-25 DIAGNOSIS — Z170 Estrogen receptor positive status [ER+]: Secondary | ICD-10-CM | POA: Insufficient documentation

## 2024-07-25 DIAGNOSIS — N62 Hypertrophy of breast: Secondary | ICD-10-CM | POA: Diagnosis not present

## 2024-07-25 DIAGNOSIS — Z803 Family history of malignant neoplasm of breast: Secondary | ICD-10-CM | POA: Insufficient documentation

## 2024-07-25 DIAGNOSIS — J4489 Other specified chronic obstructive pulmonary disease: Secondary | ICD-10-CM | POA: Diagnosis not present

## 2024-07-25 DIAGNOSIS — Z01818 Encounter for other preprocedural examination: Secondary | ICD-10-CM

## 2024-07-25 DIAGNOSIS — Z17411 Hormone receptor positive with human epidermal growth factor receptor 2 negative status: Secondary | ICD-10-CM | POA: Insufficient documentation

## 2024-07-25 DIAGNOSIS — Z9889 Other specified postprocedural states: Secondary | ICD-10-CM | POA: Diagnosis not present

## 2024-07-25 DIAGNOSIS — C50912 Malignant neoplasm of unspecified site of left female breast: Secondary | ICD-10-CM | POA: Diagnosis not present

## 2024-07-25 DIAGNOSIS — C50212 Malignant neoplasm of upper-inner quadrant of left female breast: Secondary | ICD-10-CM | POA: Insufficient documentation

## 2024-07-25 HISTORY — PX: BREAST REDUCTION SURGERY: SHX8

## 2024-07-25 HISTORY — DX: Hyperlipidemia, unspecified: E78.5

## 2024-07-25 HISTORY — DX: Anxiety disorder, unspecified: F41.9

## 2024-07-25 SURGERY — MAMMOPLASTY, REDUCTION
Anesthesia: General | Site: Breast | Laterality: Bilateral

## 2024-07-25 MED ORDER — NITROGLYCERIN 2 % TD OINT
TOPICAL_OINTMENT | TRANSDERMAL | Status: DC | PRN
  Administered 2024-07-25: .5 [in_us] via TOPICAL

## 2024-07-25 MED ORDER — DEXAMETHASONE SODIUM PHOSPHATE 10 MG/ML IJ SOLN
INTRAMUSCULAR | Status: AC
  Filled 2024-07-25: qty 1

## 2024-07-25 MED ORDER — FENTANYL CITRATE (PF) 100 MCG/2ML IJ SOLN
INTRAMUSCULAR | Status: AC
  Filled 2024-07-25: qty 2

## 2024-07-25 MED ORDER — MEPERIDINE HCL 25 MG/ML IJ SOLN: 6.2500 mg | INTRAMUSCULAR | Status: DC | PRN

## 2024-07-25 MED ORDER — HYDROMORPHONE HCL 1 MG/ML IJ SOLN
0.2500 mg | INTRAMUSCULAR | Status: DC | PRN
  Administered 2024-07-25: 0.5 mg via INTRAVENOUS

## 2024-07-25 MED ORDER — HYDROMORPHONE HCL 1 MG/ML IJ SOLN
INTRAMUSCULAR | Status: DC | PRN
  Administered 2024-07-25 (×2): .5 mg via INTRAVENOUS

## 2024-07-25 MED ORDER — ACETAMINOPHEN 500 MG PO TABS
ORAL_TABLET | ORAL | Status: AC
  Filled 2024-07-25: qty 2

## 2024-07-25 MED ORDER — SUGAMMADEX SODIUM 200 MG/2ML IV SOLN
INTRAVENOUS | Status: DC | PRN
  Administered 2024-07-25: 200 mg via INTRAVENOUS

## 2024-07-25 MED ORDER — PHENYLEPHRINE 80 MCG/ML (10ML) SYRINGE FOR IV PUSH (FOR BLOOD PRESSURE SUPPORT)
PREFILLED_SYRINGE | INTRAVENOUS | Status: AC
Start: 2024-07-25 — End: 2024-07-25
  Filled 2024-07-25: qty 10

## 2024-07-25 MED ORDER — AMISULPRIDE (ANTIEMETIC) 5 MG/2ML IV SOLN
INTRAVENOUS | Status: AC
  Filled 2024-07-25: qty 4

## 2024-07-25 MED ORDER — AMISULPRIDE (ANTIEMETIC) 5 MG/2ML IV SOLN
10.0000 mg | Freq: Once | INTRAVENOUS | Status: AC
Start: 2024-07-25 — End: 2024-07-25
  Administered 2024-07-25: 10 mg via INTRAVENOUS

## 2024-07-25 MED ORDER — 0.9 % SODIUM CHLORIDE (POUR BTL) OPTIME
TOPICAL | Status: DC | PRN
  Administered 2024-07-25: 1000 mL

## 2024-07-25 MED ORDER — HYDROMORPHONE HCL 1 MG/ML IJ SOLN
INTRAMUSCULAR | Status: AC
  Filled 2024-07-25: qty 0.5

## 2024-07-25 MED ORDER — ACETAMINOPHEN 500 MG PO TABS: 1000.0000 mg | ORAL_TABLET | ORAL | Status: AC

## 2024-07-25 MED ORDER — MIDAZOLAM HCL 5 MG/5ML IJ SOLN
INTRAMUSCULAR | Status: DC | PRN
  Administered 2024-07-25: 2 mg via INTRAVENOUS

## 2024-07-25 MED ORDER — ONDANSETRON HCL 4 MG/2ML IJ SOLN
INTRAMUSCULAR | Status: DC | PRN
  Administered 2024-07-25 (×2): 4 mg via INTRAVENOUS

## 2024-07-25 MED ORDER — DEXAMETHASONE SODIUM PHOSPHATE 10 MG/ML IJ SOLN
INTRAMUSCULAR | Status: DC | PRN
  Administered 2024-07-25: 10 mg via INTRAVENOUS

## 2024-07-25 MED ORDER — CELECOXIB 200 MG PO CAPS
200.0000 mg | ORAL_CAPSULE | ORAL | Status: AC
  Administered 2024-07-25: 200 mg via ORAL

## 2024-07-25 MED ORDER — OXYCODONE HCL 5 MG/5ML PO SOLN: 5.0000 mg | Freq: Once | ORAL | Status: AC | PRN

## 2024-07-25 MED ORDER — GABAPENTIN 300 MG PO CAPS
300.0000 mg | ORAL_CAPSULE | ORAL | Status: AC
  Administered 2024-07-25: 300 mg via ORAL

## 2024-07-25 MED ORDER — OXYCODONE HCL 5 MG PO TABS
5.0000 mg | ORAL_TABLET | Freq: Once | ORAL | Status: AC | PRN
  Administered 2024-07-25: 5 mg via ORAL

## 2024-07-25 MED ORDER — OXYCODONE HCL 5 MG PO TABS: 5.0000 mg | ORAL_TABLET | Freq: Four times a day (QID) | ORAL | 0 refills | Status: DC | PRN

## 2024-07-25 MED ORDER — FENTANYL CITRATE (PF) 100 MCG/2ML IJ SOLN
INTRAMUSCULAR | Status: DC | PRN
  Administered 2024-07-25 (×2): 50 ug via INTRAVENOUS
  Administered 2024-07-25: 100 ug via INTRAVENOUS

## 2024-07-25 MED ORDER — CEFAZOLIN SODIUM-DEXTROSE 2-4 GM/100ML-% IV SOLN
2.0000 g | INTRAVENOUS | Status: AC
  Administered 2024-07-25: 2 g via INTRAVENOUS

## 2024-07-25 MED ORDER — MIDAZOLAM HCL 2 MG/2ML IJ SOLN
INTRAMUSCULAR | Status: AC
  Filled 2024-07-25: qty 2

## 2024-07-25 MED ORDER — LACTATED RINGERS IV SOLN: INTRAVENOUS | Status: DC

## 2024-07-25 MED ORDER — ACETAMINOPHEN 500 MG PO TABS
1000.0000 mg | ORAL_TABLET | Freq: Once | ORAL | Status: AC
  Administered 2024-07-25: 1000 mg via ORAL

## 2024-07-25 MED ORDER — GABAPENTIN 300 MG PO CAPS
ORAL_CAPSULE | ORAL | Status: AC
  Filled 2024-07-25: qty 1

## 2024-07-25 MED ORDER — ONDANSETRON HCL 4 MG/2ML IJ SOLN
INTRAMUSCULAR | Status: AC
  Filled 2024-07-25: qty 2

## 2024-07-25 MED ORDER — LIDOCAINE 2% (20 MG/ML) 5 ML SYRINGE
INTRAMUSCULAR | Status: DC | PRN
  Administered 2024-07-25: 20 mg via INTRAVENOUS

## 2024-07-25 MED ORDER — CELECOXIB 200 MG PO CAPS
ORAL_CAPSULE | ORAL | Status: AC
  Filled 2024-07-25: qty 1

## 2024-07-25 MED ORDER — LIDOCAINE 2% (20 MG/ML) 5 ML SYRINGE
INTRAMUSCULAR | Status: AC
  Filled 2024-07-25: qty 5

## 2024-07-25 MED ORDER — PHENYLEPHRINE HCL (PRESSORS) 10 MG/ML IV SOLN
INTRAVENOUS | Status: DC | PRN
  Administered 2024-07-25 (×3): 80 ug via INTRAVENOUS

## 2024-07-25 MED ORDER — BUPIVACAINE HCL (PF) 0.5 % IJ SOLN
INTRAMUSCULAR | Status: DC | PRN
  Administered 2024-07-25: 30 mL

## 2024-07-25 MED ORDER — ROCURONIUM BROMIDE 10 MG/ML (PF) SYRINGE
PREFILLED_SYRINGE | INTRAVENOUS | Status: AC
Start: 2024-07-25 — End: 2024-07-25
  Filled 2024-07-25: qty 10

## 2024-07-25 MED ORDER — CEFAZOLIN SODIUM-DEXTROSE 2-4 GM/100ML-% IV SOLN
INTRAVENOUS | Status: AC
  Filled 2024-07-25: qty 100

## 2024-07-25 MED ORDER — OXYCODONE HCL 5 MG PO TABS
ORAL_TABLET | ORAL | Status: AC
  Filled 2024-07-25: qty 1

## 2024-07-25 MED ORDER — MIDAZOLAM HCL 2 MG/2ML IJ SOLN: 0.5000 mg | Freq: Once | INTRAMUSCULAR | Status: DC | PRN

## 2024-07-25 MED ORDER — ROCURONIUM BROMIDE 100 MG/10ML IV SOLN
INTRAVENOUS | Status: DC | PRN
  Administered 2024-07-25: 60 mg via INTRAVENOUS

## 2024-07-25 MED ORDER — PROPOFOL 10 MG/ML IV BOLUS
INTRAVENOUS | Status: DC | PRN
  Administered 2024-07-25: 150 mg via INTRAVENOUS

## 2024-07-25 MED ORDER — NITROGLYCERIN 2 % TD OINT
TOPICAL_OINTMENT | TRANSDERMAL | Status: AC
  Filled 2024-07-25: qty 30

## 2024-07-25 SURGICAL SUPPLY — 43 items
BINDER BREAST 3XL (GAUZE/BANDAGES/DRESSINGS) IMPLANT
BINDER BREAST LRG (GAUZE/BANDAGES/DRESSINGS) IMPLANT
BINDER BREAST MEDIUM (GAUZE/BANDAGES/DRESSINGS) IMPLANT
BINDER BREAST XLRG (GAUZE/BANDAGES/DRESSINGS) IMPLANT
BINDER BREAST XXLRG (GAUZE/BANDAGES/DRESSINGS) IMPLANT
BLADE SURG 10 STRL SS (BLADE) ×8 IMPLANT
BNDG GAUZE DERMACEA FLUFF 4 (GAUZE/BANDAGES/DRESSINGS) ×4 IMPLANT
CANISTER SUCT 1200ML W/VALVE (MISCELLANEOUS) ×2 IMPLANT
CHLORAPREP W/TINT 26 (MISCELLANEOUS) ×4 IMPLANT
COVER BACK TABLE 60X90IN (DRAPES) ×2 IMPLANT
COVER MAYO STAND STRL (DRAPES) ×2 IMPLANT
DERMABOND ADVANCED .7 DNX12 (GAUZE/BANDAGES/DRESSINGS) ×4 IMPLANT
DRAIN CHANNEL 15F RND FF W/TCR (WOUND CARE) IMPLANT
DRAPE TOP ARMCOVERS (MISCELLANEOUS) ×2 IMPLANT
DRAPE U-SHAPE 76X120 STRL (DRAPES) ×2 IMPLANT
DRAPE UTILITY XL STRL (DRAPES) ×2 IMPLANT
ELECT COATED BLADE 2.86 ST (ELECTRODE) ×2 IMPLANT
ELECTRODE REM PT RTRN 9FT ADLT (ELECTROSURGICAL) ×2 IMPLANT
EVACUATOR SILICONE 100CC (DRAIN) IMPLANT
GAUZE PAD ABD 8X10 STRL (GAUZE/BANDAGES/DRESSINGS) ×4 IMPLANT
GLOVE BIO SURGEON STRL SZ 6 (GLOVE) ×4 IMPLANT
GOWN STRL REUS W/ TWL LRG LVL3 (GOWN DISPOSABLE) ×4 IMPLANT
MARKER SKIN DUAL TIP RULER LAB (MISCELLANEOUS) IMPLANT
NDL HYPO 25X1 1.5 SAFETY (NEEDLE) ×2 IMPLANT
NEEDLE HYPO 25X1 1.5 SAFETY (NEEDLE) ×1 IMPLANT
NS IRRIG 1000ML POUR BTL (IV SOLUTION) ×2 IMPLANT
PACK BASIN DAY SURGERY FS (CUSTOM PROCEDURE TRAY) ×2 IMPLANT
PENCIL SMOKE EVACUATOR (MISCELLANEOUS) ×2 IMPLANT
PIN SAFETY STERILE (MISCELLANEOUS) ×2 IMPLANT
SHEET MEDIUM DRAPE 40X70 STRL (DRAPES) ×2 IMPLANT
SLEEVE SCD COMPRESS KNEE MED (STOCKING) ×2 IMPLANT
SPONGE T-LAP 18X18 ~~LOC~~+RFID (SPONGE) ×6 IMPLANT
STAPLER SKIN PROX WIDE 3.9 (STAPLE) ×2 IMPLANT
SUT ETHILON 2 0 FS 18 (SUTURE) IMPLANT
SUT MNCRL AB 3-0 PS2 18 (SUTURE) IMPLANT
SUT MNCRL AB 4-0 PS2 18 (SUTURE) IMPLANT
SUT VIC AB 3-0 PS1 18XBRD (SUTURE) IMPLANT
SYR BULB IRRIG 60ML STRL (SYRINGE) ×2 IMPLANT
SYR CONTROL 10ML LL (SYRINGE) ×2 IMPLANT
TOWEL GREEN STERILE FF (TOWEL DISPOSABLE) ×4 IMPLANT
TUBE CONNECTING 20X1/4 (TUBING) ×2 IMPLANT
UNDERPAD 30X36 HEAVY ABSORB (UNDERPADS AND DIAPERS) ×4 IMPLANT
YANKAUER SUCT BULB TIP NO VENT (SUCTIONS) ×2 IMPLANT

## 2024-07-25 NOTE — Discharge Instructions (Signed)

## 2024-07-25 NOTE — Anesthesia Procedure Notes (Signed)
 Procedure Name: Intubation Date/Time: 07/25/2024 12:18 PM  Performed by: Debarah Chiquita LABOR, CRNAPre-anesthesia Checklist: Patient identified, Emergency Drugs available, Suction available and Patient being monitored Patient Re-evaluated:Patient Re-evaluated prior to induction Oxygen Delivery Method: Circle system utilized Preoxygenation: Pre-oxygenation with 100% oxygen Induction Type: IV induction Ventilation: Mask ventilation without difficulty Laryngoscope Size: Mac and 3 Grade View: Grade II Tube type: Oral Tube size: 7.0 mm Number of attempts: 1 Airway Equipment and Method: Stylet and Bite block Placement Confirmation: ETT inserted through vocal cords under direct vision, positive ETCO2 and breath sounds checked- equal and bilateral Secured at: 22 cm Tube secured with: Tape Dental Injury: Teeth and Oropharynx as per pre-operative assessment

## 2024-07-25 NOTE — Anesthesia Postprocedure Evaluation (Signed)
 Anesthesia Post Note  Patient: Mary Mendez  Procedure(s) Performed: MAMMOPLASTY, REDUCTION (Bilateral: Breast)     Patient location during evaluation: PACU Anesthesia Type: General Level of consciousness: awake and alert Pain management: pain level controlled Vital Signs Assessment: post-procedure vital signs reviewed and stable Respiratory status: spontaneous breathing, nonlabored ventilation, respiratory function stable and patient connected to nasal cannula oxygen Cardiovascular status: blood pressure returned to baseline and stable Postop Assessment: no apparent nausea or vomiting Anesthetic complications: no   No notable events documented.  Last Vitals:  Vitals:   07/25/24 1630 07/25/24 1700  BP: 123/79 136/88  Pulse: 96 95  Resp: 14 20  Temp:  36.5 C  SpO2: 93% 93%    Last Pain:  Vitals:   07/25/24 1700  TempSrc: Temporal  PainSc:                  Franky JONETTA Bald

## 2024-07-25 NOTE — Transfer of Care (Signed)
 Immediate Anesthesia Transfer of Care Note  Patient: Mary Mendez  Procedure(s) Performed: MAMMOPLASTY, REDUCTION (Bilateral: Breast)  Patient Location: PACU  Anesthesia Type:General  Level of Consciousness: awake, alert , oriented, and patient cooperative  Airway & Oxygen Therapy: Patient Spontanous Breathing and Patient connected to nasal cannula oxygen  Post-op Assessment: Report given to RN and Post -op Vital signs reviewed and stable  Post vital signs: Reviewed and stable  Last Vitals:  Vitals Value Taken Time  BP 145/85 07/25/24 16:09  Temp 36.7 C 07/25/24 16:09  Pulse 100 07/25/24 16:12  Resp 15 07/25/24 16:12  SpO2 99 % 07/25/24 16:12  Vitals shown include unfiled device data.  Last Pain:  Vitals:   07/25/24 1026  TempSrc: Temporal  PainSc: 0-No pain      Patients Stated Pain Goal: 5 (07/25/24 1026)  Complications: No notable events documented.

## 2024-07-25 NOTE — Op Note (Signed)
 Operative Note   DATE OF OPERATION: 9.26.2025  LOCATION: [facility]-outpatient  SURGICAL DIVISION: Plastic Surgery  PREOPERATIVE DIAGNOSES:  1. Left breast cancer UIQ ER+  POSTOPERATIVE DIAGNOSES:  same  PROCEDURE:  Left oncoplastic breast reconstruction right breast reduction  SURGEON: Earlis Ranks MD MBA  ASSISTANT: none  ANESTHESIA:  General.   EBL: 50 ml  COMPLICATIONS: None immediate.   INDICATIONS FOR PROCEDURE:  The patient, Mary Mendez, is a 63 y.o. female born on Jul 10, 1961, is here for staged breast reconstruction following left lumpectomy.   FINDINGS: Right reduction 567 g Left reduction 359 g  DESCRIPTION OF PROCEDURE:  The patient was marked standing in the preoperative area to mark sternal notch, chest midline, anterior axillary lines, inframammary folds. The location of new nipple areolar complex was marked at level of on inframammary fold on anterior surface breast by palpation. This was marked symmetric over bilateral breasts. With aid of Wise pattern marker, location of new nipple areolar complex and vertical limbs (8 cm) were marked by displacement of breasts along meridian. The patient was taken to the operating room. SCDs were placed and IV antibiotics were given. The patient's operative site was prepped and draped in a sterile fashion. A time out was performed and all information was confirmed to be correct.     I began on left breast. Over left breast, inferior pedicle marked and nipple areolar complex incised with 45 diameter marker. Pedicle deepithlialized and developed to chest wall. Pedicle developed to chest wall. Medial and lateral triangles of breast tissue resected over lower pole. Medial and lateral flaps developed. Additional superior and lateral breast tissue excised. Breast tailor tacked closed.   I then directed attention to right breast where superior medial pedicle designed. NAC incised with 45 diameter marker. The pedicle was  deepithelialized. Pedicle developed to chest wall. Breast tissue resected over lower pole. Medial and lateral flaps developed. Additional superior and lateral breast tissue excised. Breast tailor tacked closed. Patient brought to upright sitting position and assessed for symmetry. Patient returned to supine position. Breast cavities irrigated and hemostasis obtained. Local anesthetic infiltrated throughout each breast. 15 Fr JP placed in each breast and secured with 2-0 nylon. Closure completed bilateral with interrupted and short running 3-0 vicryl used to approximate dermis along remainder inframammary fold and vertical limb. NAC inset with 3-0 vicryl in dermis. Skin closure completed with 4-0 monocryl subcuticular throughout vertical limbs and NAC. Skin closure completed with 3-0 monocryl along each IMF. During incision closure, nitroglycerin  ointment applied over right NAC. Following skin closure this was removed and right nipple areolar complex demonstrated brisk cap refill. Tissue adhesive applied. Dry dressing and breast binder applied.  The patient was allowed to wake from anesthesia, extubated and taken to the recovery room in satisfactory condition.   SPECIMENS: right and left breast reduction  DRAINS: 15 Fr JP in right and left breast  Earlis Ranks, MD [facility] Plastic & Reconstructive Surgery  Office/ physician access line after hours [phone]

## 2024-07-25 NOTE — Anesthesia Preprocedure Evaluation (Addendum)
 Anesthesia Evaluation  Patient identified by MRN, date of birth, ID band Patient awake    Reviewed: Allergy & Precautions, NPO status , Patient's Chart, lab work & pertinent test results  History of Anesthesia Complications Negative for: history of anesthetic complications  Airway Mallampati: II  TM Distance: >3 FB Neck ROM: Full    Dental  (+) Dental Advisory Given   Pulmonary asthma , COPD,  COPD inhaler   breath sounds clear to auscultation       Cardiovascular hypertension, Pt. on medications (-) angina  Rhythm:Regular Rate:Normal     Neuro/Psych   Anxiety     negative neurological ROS     GI/Hepatic negative GI ROS, Neg liver ROS,,,  Endo/Other  BMI 37  Renal/GU negative Renal ROS     Musculoskeletal   Abdominal   Peds  Hematology Hb 13.9, plt 263k   Anesthesia Other Findings H/o breast cancer  Reproductive/Obstetrics                              Anesthesia Physical Anesthesia Plan  ASA: 2  Anesthesia Plan: General   Post-op Pain Management: Tylenol  PO (pre-op)*   Induction: Intravenous  PONV Risk Score and Plan: 3 and Ondansetron , Dexamethasone  and Scopolamine  patch - Pre-op  Airway Management Planned: Oral ETT  Additional Equipment: None  Intra-op Plan:   Post-operative Plan: Extubation in OR  Informed Consent: I have reviewed the patients History and Physical, chart, labs and discussed the procedure including the risks, benefits and alternatives for the proposed anesthesia with the patient or authorized representative who has indicated his/her understanding and acceptance.     Dental advisory given  Plan Discussed with: CRNA and Surgeon  Anesthesia Plan Comments:          Anesthesia Quick Evaluation

## 2024-07-25 NOTE — Interval H&P Note (Signed)
 History and Physical Interval Note:  07/25/2024 11:29 AM  Mary Mendez  has presented today for surgery, with the diagnosis of left breast ca UIO ER POS.  The various methods of treatment have been discussed with the patient and family. After consideration of risks, benefits and other options for treatment, the patient has consented to  Procedure(s): MAMMOPLASTY, REDUCTION (Bilateral) as a surgical intervention.  The patient's history has been reviewed, patient examined, no change in status, stable for surgery.  I have reviewed the patient's chart and labs.  Questions were answered to the patient's satisfaction.     [name]

## 2024-07-26 ENCOUNTER — Encounter (HOSPITAL_BASED_OUTPATIENT_CLINIC_OR_DEPARTMENT_OTHER): Payer: Self-pay | Admitting: Plastic Surgery

## 2024-07-26 NOTE — Anesthesia Postprocedure Evaluation (Signed)
 Anesthesia Post Note  Patient: Mary Mendez  Procedure(s) Performed: BREAST LUMPECTOMY WITH RADIOACTIVE SEED AND SENTINEL LYMPH NODE BIOPSY (Left: Breast) EXCISION, MASS, UPPER EXTREMITY (Left: Shoulder)     Patient location during evaluation: PACU Anesthesia Type: General Level of consciousness: awake and alert Pain management: pain level controlled Vital Signs Assessment: post-procedure vital signs reviewed and stable Respiratory status: spontaneous breathing, nonlabored ventilation and respiratory function stable Cardiovascular status: stable and blood pressure returned to baseline Anesthetic complications: no   No notable events documented.  Last Vitals:  Vitals:   07/17/24 1430 07/17/24 1445  BP: (!) 145/92 (!) 156/90  Pulse: 77 80  Resp: 11 (!) 23  Temp:  (!) 36.3 C  SpO2: 91% 93%    Last Pain:  Vitals:   07/17/24 1445  TempSrc:   PainSc: 4                  Debby FORBES Like

## 2024-07-28 LAB — SURGICAL PATHOLOGY

## 2024-07-31 DIAGNOSIS — Z9889 Other specified postprocedural states: Secondary | ICD-10-CM | POA: Diagnosis not present

## 2024-07-31 DIAGNOSIS — C50212 Malignant neoplasm of upper-inner quadrant of left female breast: Secondary | ICD-10-CM | POA: Diagnosis not present

## 2024-07-31 DIAGNOSIS — Z170 Estrogen receptor positive status [ER+]: Secondary | ICD-10-CM | POA: Diagnosis not present

## 2024-08-06 ENCOUNTER — Encounter (HOSPITAL_COMMUNITY): Payer: Self-pay

## 2024-08-06 DIAGNOSIS — Z170 Estrogen receptor positive status [ER+]: Secondary | ICD-10-CM | POA: Diagnosis not present

## 2024-08-06 DIAGNOSIS — C50212 Malignant neoplasm of upper-inner quadrant of left female breast: Secondary | ICD-10-CM | POA: Diagnosis not present

## 2024-08-07 ENCOUNTER — Encounter: Payer: Self-pay | Admitting: *Deleted

## 2024-08-07 ENCOUNTER — Telehealth: Payer: Self-pay | Admitting: *Deleted

## 2024-08-07 NOTE — Telephone Encounter (Signed)
 Received oncotype results of 46/29%.  Appt with Dr. Loretha 10/13

## 2024-08-08 ENCOUNTER — Telehealth: Payer: Self-pay

## 2024-08-08 NOTE — Telephone Encounter (Signed)
 Spoke with patient and confirmed appointment on 10/13

## 2024-08-11 ENCOUNTER — Inpatient Hospital Stay: Attending: Hematology and Oncology | Admitting: Hematology and Oncology

## 2024-08-11 ENCOUNTER — Other Ambulatory Visit: Payer: Self-pay | Admitting: Surgery

## 2024-08-11 VITALS — BP 128/79 | HR 83 | Temp 98.6°F | Resp 18 | Wt 217.8 lb

## 2024-08-11 DIAGNOSIS — Z170 Estrogen receptor positive status [ER+]: Secondary | ICD-10-CM | POA: Diagnosis not present

## 2024-08-11 DIAGNOSIS — C50212 Malignant neoplasm of upper-inner quadrant of left female breast: Secondary | ICD-10-CM | POA: Diagnosis not present

## 2024-08-11 DIAGNOSIS — Z1732 Human epidermal growth factor receptor 2 negative status: Secondary | ICD-10-CM | POA: Insufficient documentation

## 2024-08-11 DIAGNOSIS — Z803 Family history of malignant neoplasm of breast: Secondary | ICD-10-CM | POA: Insufficient documentation

## 2024-08-11 DIAGNOSIS — Z90710 Acquired absence of both cervix and uterus: Secondary | ICD-10-CM | POA: Insufficient documentation

## 2024-08-11 DIAGNOSIS — Z1721 Progesterone receptor positive status: Secondary | ICD-10-CM | POA: Insufficient documentation

## 2024-08-11 DIAGNOSIS — Z806 Family history of leukemia: Secondary | ICD-10-CM | POA: Insufficient documentation

## 2024-08-11 NOTE — Progress Notes (Signed)
 [facility] CONSULT NOTE  Patient Care Team: Cristopher Suzen HERO, NP as PCP - General Cristopher Suzen HERO, NP Tyree Nanetta SAILOR, RN as Oncology Nurse Navigator Gerome, Devere HERO, RN as Oncology Nurse Navigator Vernetta Berg, MD as Consulting Physician (General Surgery) Loretha Ash, MD as Consulting Physician (Hematology and Oncology) Izell Domino, MD as Attending Physician (Radiation Oncology)  CHIEF COMPLAINTS/PURPOSE OF CONSULTATION:  Newly diagnosed breast cancer  HISTORY OF PRESENTING ILLNESS:  Mary Mendez 63 y.o. female is here because of recent diagnosis of left breast IDC  I reviewed her records extensively and collaborated the history with the patient.  SUMMARY OF ONCOLOGIC HISTORY: Oncology History  Malignant neoplasm of upper-inner quadrant of left breast in female, estrogen receptor positive (HCC)  06/03/2024 Pathology Results   Breast, left, needle core biopsy, mass with calcifications 5mm upper inner quadrant (ribbon clip) :      INVASIVE MAMMARY CARCINOMA      TUBULE FORMATION: SCORE 3      NUCLEAR PLEOMORPHISM: SCORE 2      MITOTIC COUNT: SCORE 1      TOTAL SCORE: 6      OVERALL GRADE: 2      LYMPHOVASCULAR INVASION: NOT IDENTIFIED      CANCER LENGTH: 5.5 MM      CALCIFICATIONS: PRESENT      OTHER FINDINGS: NONE      SEE NOTE       2. Breast, left, needle core biopsy, 2.5cm group upper inner quadrant (x clip) :      DUCTAL CARCINOMA IN SITU, INTERMEDIATE GRADE, SOLID TYPE      NECROSIS: PRESENT      CALCIFICATIONS: PRESENT      DCIS LENGTH: 1.0 CM / 10 MM      SEE NOTE  IMMUNOHISTOCHEMICAL AND MORPHOMETRIC ANALYSIS PERFORMED MANUALLY The tumor cells are NEGATIVE for Her2 (0). Estrogen Receptor:  100%, POSITIVE, STRONG STAINING INTENSITY Progesterone Receptor:  20%, POSITIVE, MODERATE STAINING INTENSITY Proliferation Marker Ki67:  60%     06/04/2024 Mammogram   Suspicious findings. The suggested screening mammographic asymmetry is  consistent with a small irregular mass/focal asymmetry with a few associated calcifications projecting in the upper inner left breast measuring 5 mm mammographically. This may correspond to the hypoechoic mass seen on sonography at 10 o'clock, 8 cm the nipple or to the second sonographic abnormality, at 10:30 o'clock, 10 cm from the nipple. This latter finding is smaller and more subtle. Given the uncertainty of which sonographic finding may be the correlate to the mammographic abnormality, stereotactic biopsy of the mammographic finding is recommended.  2.5 cm group of suspicious calcifications in the medial left breast, anterior to the above described small irregular mass/focal asymmetry. Stereotactic core needle biopsy is recommended.   06/09/2024 Initial Diagnosis   Malignant neoplasm of upper-inner quadrant of left breast in female, estrogen receptor positive (HCC)   06/11/2024 Cancer Staging   Staging form: Breast, AJCC 8th Edition - Clinical stage from 06/11/2024: Stage IA (cT1b, cN0, cM0, G2, ER+, PR+, HER2-) - Signed by Loretha Ash, MD on 06/11/2024 Stage prefix: Initial diagnosis Histologic grading system: 3 grade system Laterality: Left Staged by: Pathologist and managing physician Stage used in treatment planning: Yes National guidelines used in treatment planning: Yes Type of national guideline used in treatment planning: NCCN    Genetic Testing   [facility] CancerNext-Expanded Panel+RNA was Negative. 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 Report date is 06/18/2024.  The CancerNext-Expanded gene panel offered by [facility] and includes sequencing, rearrangement, and RNA analysis for the following 77 genes: AIP, ALK, APC, ATM, AXIN2, BAP1, BARD1, BMPR1A, BRCA1, BRCA2, BRIP1, CDC73, CDH1, CDK4, CDKN1B, CDKN2A, CEBPA, CHEK2, CTNNA1, DDX41, DICER1, ETV6, FH, FLCN, GATA2, LZTR1, MAX, MBD4, MEN1, MET, MLH1, MSH2, MSH3, MSH6, MUTYH,  NF1, NF2, NTHL1, PALB2, PHOX2B, PMS2, POT1, PRKAR1A, PTCH1, PTEN, RAD51C, RAD51D, RB1, RET, RPS20, RUNX1, SDHA, SDHAF2, SDHB, SDHC, SDHD, SMAD4, SMARCA4, SMARCB1, SMARCE1, STK11, SUFU, TMEM127, TP53, TSC1, TSC2, VHL, and WT1 (sequencing and deletion/duplication); EGFR, HOXB13, KIT, MITF, PDGFRA, POLD1, and POLE (sequencing only); EPCAM and GREM1 (deletion/duplication only).     08/25/2024 -  Chemotherapy   Patient is on Treatment Plan : BREAST TC q21d       Discussed the use of AI scribe software for clinical note transcription with the patient, who gave verbal consent to proceed.  History of Present Illness  Mary Mendez is a 63 year old female with breast cancer who presents for follow-up after surgery and discussion of chemotherapy options.  She recently underwent surgery for breast cancer, where a high-grade tumor approximately one centimeter in size was removed from her left breast. The surgical margins were clear, and lymph nodes were negative. Additionally, a noninvasive breast cancer lesion measuring about two millimeters was also excised. She has had post-operative follow-ups with her surgeon.  The oncotype test results indicated a high score of 46. She underwent bilateral reduction mammoplasty and is healing well. She experiences discomfort when sleeping on her sides post-surgery but is gradually feeling better.  She has a history of asthma, which raises concerns about her immune system, especially in the context of chemotherapy. She experiences frequent respiratory infections, which is a concern for her and her family.  She is considering the placement of a port for chemotherapy administration to avoid multiple needle sticks. She is also exploring options to manage potential side effects of chemotherapy, such as neuropathy, hair loss, and fatigue. She is interested in dietary changes to support her health during treatment.    MEDICAL HISTORY:  Past Medical History:   Diagnosis Date   Anxiety    Asthma    Bronchitis    Cancer (HCC) 06/2024   Left breast IDC   COVID-19 08/2021   Hyperlipidemia    Hypertension    Pneumonia     SURGICAL HISTORY: Past Surgical History:  Procedure Laterality Date   ABDOMINAL HYSTERECTOMY  2010   and left ovaries;   BREAST BIOPSY     BREAST BIOPSY Left 06/04/2024   MM LT BREAST BX W LOC DEV 1ST LESION IMAGE BX SPEC STEREO GUIDE 06/04/2024 [facility]   BREAST BIOPSY Left 06/04/2024   MM LT BREAST BX W LOC DEV EA AD LESION IMG BX SPEC STEREO GUIDE 06/04/2024 [facility]   BREAST BIOPSY  07/15/2024   MM LT RADIOACTIVE SEED LOC MAMMO GUIDE 07/15/2024 [facility]   BREAST BIOPSY  07/15/2024   MM LT RADIOACTIVE SEED EA ADD LESION LOC MAMMO GUIDE 07/15/2024 [facility]   BREAST BIOPSY  07/15/2024   MM LT RADIOACTIVE SEED EA ADD LESION LOC MAMMO GUIDE 07/15/2024 [facility]   BREAST BIOPSY  07/15/2024   MM LT RADIOACTIVE SEED EA ADD LESION LOC MAMMO GUIDE 07/15/2024 [facility]   BREAST EXCISIONAL BIOPSY Left    BREAST LUMPECTOMY WITH RADIOACTIVE SEED AND SENTINEL LYMPH NODE BIOPSY Left 07/17/2024   Procedure: BREAST LUMPECTOMY WITH RADIOACTIVE SEED AND SENTINEL LYMPH NODE BIOPSY;  Surgeon:  Vernetta Berg, MD;  Location[facility] OR;  Service: General;  Laterality: Left;  RADIOACTIVE SEED GUIDED LEFT BREAST BRACKETED LUMPECTOMY AND SENTINEL NODE BIOPSY   BREAST REDUCTION SURGERY Bilateral 07/25/2024   Procedure: MAMMOPLASTY, REDUCTION;  Surgeon: Arelia Filippo, MD;  Location: [facility];  Service: Plastics;  Laterality: Bilateral;   BREAST SURGERY     CHOLECYSTECTOMY N/A 02/18/2022   Procedure: LAPAROSCOPIC CHOLECYSTECTOMY;  Surgeon: Signe Mitzie LABOR, MD;  Location: [facility] OR;  Service: General;  Laterality: N/A;   EXCISION, MASS, UPPER EXTREMITY Left 07/17/2024   Procedure: EXCISION, MASS, UPPER EXTREMITY;  Surgeon: Vernetta Berg, MD;  Location: [facility] OR;  Service: General;   Laterality: Left;  EXCISION LEFT SHOULDER MASS   HYSTEROTOMY      SOCIAL HISTORY: Social History   Socioeconomic History   Marital status: Married    Spouse name: Not on file   Number of children: Not on file   Years of education: Not on file   Highest education level: Not on file  Occupational History   Not on file  Tobacco Use   Smoking status: Never    Passive exposure: Never   Smokeless tobacco: Never  Vaping Use   Vaping status: Never Used  Substance and Sexual Activity   Alcohol use: Yes    Comment: social   Drug use: Never   Sexual activity: Not Currently    Birth control/protection: Surgical    Comment: hyst  Other Topics Concern   Not on file  Social History Narrative   Not on file   Social Drivers of Health   Financial Resource Strain: Not on file  Food Insecurity: No Food Insecurity (06/11/2024)   Hunger Vital Sign    Worried About Running Out of Food in the Last Year: Never true    Ran Out of Food in the Last Year: Never true  Transportation Needs: No Transportation Needs (06/11/2024)   PRAPARE - Administrator, Civil Service (Medical): No    Lack of Transportation (Non-Medical): No  Physical Activity: Not on file  Stress: Not on file  Social Connections: Not on file  Intimate Partner Violence: Not At Risk (06/11/2024)   Humiliation, Afraid, Rape, and Kick questionnaire    Fear of Current or Ex-Partner: No    Emotionally Abused: No    Physically Abused: No    Sexually Abused: No    FAMILY HISTORY: Family History  Problem Relation Age of Onset   Breast cancer Mother 20   Emphysema Father    Leukemia Maternal Aunt 1 - 79   Asthma Paternal Uncle    Colon cancer Neg Hx    Colon polyps Neg Hx    Esophageal cancer Neg Hx    Rectal cancer Neg Hx    Stomach cancer Neg Hx     ALLERGIES:  has no known allergies.  MEDICATIONS:  Current Outpatient Medications  Medication Sig Dispense Refill   acetaminophen  (TYLENOL ) 500 MG tablet Take  1,000 mg by mouth every 6 (six) hours as needed for mild pain.     albuterol  (PROVENTIL ) (2.5 MG/3ML) 0.083% nebulizer solution Take 3 mLs (2.5 mg total) by nebulization every 6 (six) hours as needed for wheezing or shortness of breath. 75 mL 12   albuterol  (VENTOLIN  HFA) 108 (90 Base) MCG/ACT inhaler Inhale into the lungs every 6 (six) hours as needed for wheezing or shortness of breath.     amLODipine (NORVASC) 5 MG tablet Take 5 mg by mouth daily.  busPIRone (BUSPAR) 10 MG tablet Take 10 mg by mouth 3 (three) times daily as needed (Anxiety).     Ibuprofen 200 MG CAPS Take 400 mg by mouth every 8 (eight) hours as needed (pain). Pain     omega-3 acid ethyl esters (LOVAZA) 1 g capsule Take 1 g by mouth daily.     oxyCODONE  (OXY IR/ROXICODONE ) 5 MG immediate release tablet Take 1 tablet (5 mg total) by mouth every 6 (six) hours as needed for moderate pain (pain score 4-6) or severe pain (pain score 7-10). 15 tablet 0   rosuvastatin (CRESTOR) 10 MG tablet Take 10 mg by mouth daily.     tretinoin (RETIN-A) 0.1 % cream Apply 1 application  topically at bedtime.     No current facility-administered medications for this visit.   ECOG PERFORMANCE STATUS: 0 - Asymptomatic  Vitals:   08/11/24 1153  BP: 128/79  Pulse: 83  Resp: 18  Temp: 98.6 F (37 C)  SpO2: 100%    Filed Weights   08/11/24 1153  Weight: 217 lb 12.8 oz (98.8 kg)     GENERAL:alert, no distress and comfortable S/p bilateral reduction mammoplasty. Healing well. Slough noted around the periareolar scar   LABORATORY DATA:  I have reviewed the data as listed Lab Results  Component Value Date   WBC 10.6 (H) 07/14/2024   HGB 13.9 07/14/2024   HCT 42.4 07/14/2024   MCV 88.3 07/14/2024   PLT 263 07/14/2024   Lab Results  Component Value Date   NA 138 07/14/2024   K 4.3 07/14/2024   CL 105 07/14/2024   CO2 26 07/14/2024    RADIOGRAPHIC STUDIES: I have personally reviewed the radiological reports and agreed with  the findings in the report.  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 Oncotype DX score 46, high recurrence risk. Chemotherapy recommended to reduce metastasis risk. - Recommend PC regimen: two chemotherapy drugs every 21 days for four cycles, followed by radiation and anti-estrogen therapy for five years. - Discuss side effects: fatigue, nausea, constipation, immunosuppression, neuropathy, hair loss. - Recommend port for chemotherapy administration. - Consider cold cap to reduce hair loss. - Coordinate with Dr. Vernetta and Dr. Arelia for surgical clearance. - Schedule chemotherapy class for education. - Arrange port placement under general anesthesia. - Plan to start chemotherapy in 4-5 weeks, pending surgical healing and clearance. - Discuss cold cap option during chemotherapy class.       Time spent: 45 min including H and P, review of records, counseling and coordination of care. All questions were answered. The patient knows to call the clinic with any problems, questions or concerns.    Amber Stalls, MD 08/11/24

## 2024-08-11 NOTE — Assessment & Plan Note (Signed)
 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 Oncotype DX score 46, high recurrence risk. Chemotherapy recommended to reduce metastasis risk. - Recommend PC regimen: two chemotherapy drugs every 21 days for four cycles, followed by radiation and anti-estrogen therapy for five years. - Discuss side effects: fatigue, nausea, constipation, immunosuppression, neuropathy, hair loss. - Recommend port for chemotherapy administration. - Consider cold cap to reduce hair loss. - Coordinate with Dr. Vernetta and Dr. Arelia for surgical clearance. - Schedule chemotherapy class for education. - Arrange port placement under general anesthesia. - Plan to start chemotherapy in 4-5 weeks, pending surgical healing and clearance. - Discuss cold cap option during chemotherapy class.

## 2024-08-11 NOTE — Progress Notes (Signed)
 START ON PATHWAY REGIMEN - Breast     A cycle is every 21 days:     Cyclophosphamide      Docetaxel   **Always confirm dose/schedule in your pharmacy ordering system**  Patient Characteristics: Postoperative without Neoadjuvant Therapy, M0 (Pathologic Staging), Invasive Disease, Adjuvant Therapy, HER2 Negative, ER Positive, Node Negative, pT1a, pN31mi or pT1b-c, pN0/N7mi, Oncotype High Risk (? 26) Therapeutic Status: Postoperative without Neoadjuvant Therapy, M0 (Pathologic Staging) AJCC Grade: G3 AJCC N Category: pN0 AJCC M Category: cM0 ER Status: Positive (+) AJCC 8 Stage Grouping: IA HER2 Status: Negative (-) Oncotype Dx Recurrence Score: 46 AJCC T Category: pT1 PR Status: Positive (+) Has this patient completed genomic testing<= Yes - Oncotype DX(R) Intent of Therapy: Curative Intent, Discussed with Patient

## 2024-08-12 ENCOUNTER — Encounter: Payer: Self-pay | Admitting: *Deleted

## 2024-08-12 ENCOUNTER — Other Ambulatory Visit: Payer: Self-pay

## 2024-08-14 ENCOUNTER — Encounter: Payer: Self-pay | Admitting: *Deleted

## 2024-08-14 ENCOUNTER — Encounter (HOSPITAL_BASED_OUTPATIENT_CLINIC_OR_DEPARTMENT_OTHER): Payer: Self-pay | Admitting: Surgery

## 2024-08-15 NOTE — Progress Notes (Signed)

## 2024-08-17 NOTE — H&P (View-Only) (Signed)
 Mary Mendez is an 63 y.o. female.   Chief Complaint: breast cancer, need for further treatment HPI: This is a pleasant 63 yr old with the recent diagnosis of left breast cancer s/p radioactive seed guided lumpectomy and sentinel node biopsy followed the week after with an oncoplastic reduction by plastic surgery.  An oncotype was sent and came back with her being at high risk for recurrence.  After meeting again with medical oncology, chemotherapy has been recommended.  She presents now for port-a-cath insertion.  She is doing well  Past Medical History:  Diagnosis Date   Anxiety    Asthma    Bronchitis    Cancer (HCC) 06/2024   Left breast IDC   COVID-19 08/2021   Hyperlipidemia    Hypertension    Pneumonia     Past Surgical History:  Procedure Laterality Date   ABDOMINAL HYSTERECTOMY  2010   and left ovaries;   BREAST BIOPSY     BREAST BIOPSY Left 06/04/2024   MM LT BREAST BX W LOC DEV 1ST LESION IMAGE BX SPEC STEREO GUIDE 06/04/2024 [facility]   BREAST BIOPSY Left 06/04/2024   MM LT BREAST BX W LOC DEV EA AD LESION IMG BX SPEC STEREO GUIDE 06/04/2024 [facility]   BREAST BIOPSY  07/15/2024   MM LT RADIOACTIVE SEED LOC MAMMO GUIDE 07/15/2024 [facility]   BREAST BIOPSY  07/15/2024   MM LT RADIOACTIVE SEED EA ADD LESION LOC MAMMO GUIDE 07/15/2024 [facility]   BREAST BIOPSY  07/15/2024   MM LT RADIOACTIVE SEED EA ADD LESION LOC MAMMO GUIDE 07/15/2024 [facility]   BREAST BIOPSY  07/15/2024   MM LT RADIOACTIVE SEED EA ADD LESION LOC MAMMO GUIDE 07/15/2024 [facility]   BREAST EXCISIONAL BIOPSY Left    BREAST LUMPECTOMY WITH RADIOACTIVE SEED AND SENTINEL LYMPH NODE BIOPSY Left 07/17/2024   Procedure: BREAST LUMPECTOMY WITH RADIOACTIVE SEED AND SENTINEL LYMPH NODE BIOPSY;  Surgeon: Vernetta Berg, MD;  Location: [facility] OR;  Service: General;  Laterality: Left;  RADIOACTIVE SEED GUIDED LEFT BREAST BRACKETED LUMPECTOMY AND SENTINEL NODE BIOPSY    BREAST REDUCTION SURGERY Bilateral 07/25/2024   Procedure: MAMMOPLASTY, REDUCTION;  Surgeon: Arelia Filippo, MD;  Location: [facility];  Service: Plastics;  Laterality: Bilateral;   BREAST SURGERY     CHOLECYSTECTOMY N/A 02/18/2022   Procedure: LAPAROSCOPIC CHOLECYSTECTOMY;  Surgeon: Signe Mitzie DELENA, MD;  Location: [facility] OR;  Service: General;  Laterality: N/A;   EXCISION, MASS, UPPER EXTREMITY Left 07/17/2024   Procedure: EXCISION, MASS, UPPER EXTREMITY;  Surgeon: Vernetta Berg, MD;  Location: [facility];  Service: General;  Laterality: Left;  EXCISION LEFT SHOULDER MASS   HYSTEROTOMY      Family History  Problem Relation Age of Onset   Breast cancer Mother 86   Emphysema Father    Leukemia Maternal Aunt 71 - 79   Asthma Paternal Uncle    Colon cancer Neg Hx    Colon polyps Neg Hx    Esophageal cancer Neg Hx    Rectal cancer Neg Hx    Stomach cancer Neg Hx    Social History:  reports that she has never smoked. She has never been exposed to tobacco smoke. She has never used smokeless tobacco. She reports current alcohol use. She reports that she does not use drugs.  Allergies: No Known Allergies  No medications prior to admission.    No results found for this or any previous visit (from the past 48 hours). No results found.  Review of Systems  All other systems reviewed and are negative.   Height 5' 5 (1.651 m), weight 98 kg. Physical Exam Constitutional:      Appearance: Normal appearance.  HENT:     Head: Normocephalic and atraumatic.     Mouth/Throat:     Pharynx: Oropharynx is clear.  Cardiovascular:     Rate and Rhythm: Normal rate and regular rhythm.  Pulmonary:     Effort: Pulmonary effort is normal. No respiratory distress.  Musculoskeletal:     Cervical back: Normal range of motion.  Skin:    General: Skin is warm and dry.  Neurological:     General: No focal deficit present.     Mental Status: She is alert.  Psychiatric:        Mood and  Affect: Mood normal.        Behavior: Behavior normal.      Assessment/Plan Left breast cancer s/p lumpectomy now in need of port-a-cath placement for IV chemotherapy  I explained the surgical procedure in detail. We discussed the risks which include but are not limited to bleeding, infection, injury to surrounding structures including blood vessels, pneumothorax, port malfunction, the need for other procedures, cardiopulmonary issues, blood clots, post op care, etc.  She agrees to proceed with surgery  Vicenta Poli, MD 08/17/2024, 9:32 AM

## 2024-08-17 NOTE — H&P (Signed)
 Mary Mendez is an 63 y.o. female.   Chief Complaint: breast cancer, need for further treatment HPI: This is a pleasant 63 yr old with the recent diagnosis of left breast cancer s/p radioactive seed guided lumpectomy and sentinel node biopsy followed the week after with an oncoplastic reduction by plastic surgery.  An oncotype was sent and came back with her being at high risk for recurrence.  After meeting again with medical oncology, chemotherapy has been recommended.  She presents now for port-a-cath insertion.  She is doing well  Past Medical History:  Diagnosis Date   Anxiety    Asthma    Bronchitis    Cancer (HCC) 06/2024   Left breast IDC   COVID-19 08/2021   Hyperlipidemia    Hypertension    Pneumonia     Past Surgical History:  Procedure Laterality Date   ABDOMINAL HYSTERECTOMY  2010   and left ovaries;   BREAST BIOPSY     BREAST BIOPSY Left 06/04/2024   MM LT BREAST BX W LOC DEV 1ST LESION IMAGE BX SPEC STEREO GUIDE 06/04/2024 [facility]   BREAST BIOPSY Left 06/04/2024   MM LT BREAST BX W LOC DEV EA AD LESION IMG BX SPEC STEREO GUIDE 06/04/2024 [facility]   BREAST BIOPSY  07/15/2024   MM LT RADIOACTIVE SEED LOC MAMMO GUIDE 07/15/2024 [facility]   BREAST BIOPSY  07/15/2024   MM LT RADIOACTIVE SEED EA ADD LESION LOC MAMMO GUIDE 07/15/2024 [facility]   BREAST BIOPSY  07/15/2024   MM LT RADIOACTIVE SEED EA ADD LESION LOC MAMMO GUIDE 07/15/2024 [facility]   BREAST BIOPSY  07/15/2024   MM LT RADIOACTIVE SEED EA ADD LESION LOC MAMMO GUIDE 07/15/2024 [facility]   BREAST EXCISIONAL BIOPSY Left    BREAST LUMPECTOMY WITH RADIOACTIVE SEED AND SENTINEL LYMPH NODE BIOPSY Left 07/17/2024   Procedure: BREAST LUMPECTOMY WITH RADIOACTIVE SEED AND SENTINEL LYMPH NODE BIOPSY;  Surgeon: Vernetta Berg, MD;  Location: [facility];  Service: General;  Laterality: Left;  RADIOACTIVE SEED GUIDED LEFT BREAST BRACKETED LUMPECTOMY AND SENTINEL NODE BIOPSY    BREAST REDUCTION SURGERY Bilateral 07/25/2024   Procedure: MAMMOPLASTY, REDUCTION;  Surgeon: Arelia Filippo, MD;  Location: [facility];  Service: Plastics;  Laterality: Bilateral;   BREAST SURGERY     CHOLECYSTECTOMY N/A 02/18/2022   Procedure: LAPAROSCOPIC CHOLECYSTECTOMY;  Surgeon: Signe Mitzie DELENA, MD;  Location: [facility];  Service: General;  Laterality: N/A;   EXCISION, MASS, UPPER EXTREMITY Left 07/17/2024   Procedure: EXCISION, MASS, UPPER EXTREMITY;  Surgeon: Vernetta Berg, MD;  Location: [facility];  Service: General;  Laterality: Left;  EXCISION LEFT SHOULDER MASS   HYSTEROTOMY      Family History  Problem Relation Age of Onset   Breast cancer Mother 86   Emphysema Father    Leukemia Maternal Aunt 71 - 79   Asthma Paternal Uncle    Colon cancer Neg Hx    Colon polyps Neg Hx    Esophageal cancer Neg Hx    Rectal cancer Neg Hx    Stomach cancer Neg Hx    Social History:  reports that she has never smoked. She has never been exposed to tobacco smoke. She has never used smokeless tobacco. She reports current alcohol use. She reports that she does not use drugs.  Allergies: No Known Allergies  No medications prior to admission.    No results found for this or any previous visit (from the past 48 hours). No results found.  Review of Systems  All other systems reviewed and are negative.   Height 5' 5 (1.651 m), weight 98 kg. Physical Exam Constitutional:      Appearance: Normal appearance.  HENT:     Head: Normocephalic and atraumatic.     Mouth/Throat:     Pharynx: Oropharynx is clear.  Cardiovascular:     Rate and Rhythm: Normal rate and regular rhythm.  Pulmonary:     Effort: Pulmonary effort is normal. No respiratory distress.  Musculoskeletal:     Cervical back: Normal range of motion.  Skin:    General: Skin is warm and dry.  Neurological:     General: No focal deficit present.     Mental Status: She is alert.  Psychiatric:        Mood and  Affect: Mood normal.        Behavior: Behavior normal.      Assessment/Plan Left breast cancer s/p lumpectomy now in need of port-a-cath placement for IV chemotherapy  I explained the surgical procedure in detail. We discussed the risks which include but are not limited to bleeding, infection, injury to surrounding structures including blood vessels, pneumothorax, port malfunction, the need for other procedures, cardiopulmonary issues, blood clots, post op care, etc.  She agrees to proceed with surgery  Vicenta Poli, MD 08/17/2024, 9:32 AM

## 2024-08-18 ENCOUNTER — Encounter (HOSPITAL_BASED_OUTPATIENT_CLINIC_OR_DEPARTMENT_OTHER): Payer: Self-pay | Admitting: Surgery

## 2024-08-18 ENCOUNTER — Other Ambulatory Visit: Payer: Self-pay | Admitting: Hematology and Oncology

## 2024-08-18 ENCOUNTER — Other Ambulatory Visit: Payer: Self-pay

## 2024-08-18 ENCOUNTER — Ambulatory Visit (HOSPITAL_COMMUNITY)

## 2024-08-18 ENCOUNTER — Ambulatory Visit (HOSPITAL_BASED_OUTPATIENT_CLINIC_OR_DEPARTMENT_OTHER)
Admission: RE | Admit: 2024-08-18 | Discharge: 2024-08-18 | Disposition: A | Discharge status: Home / Self Care | Attending: Surgery | Admitting: Surgery

## 2024-08-18 ENCOUNTER — Ambulatory Visit (HOSPITAL_BASED_OUTPATIENT_CLINIC_OR_DEPARTMENT_OTHER): Admitting: Anesthesiology

## 2024-08-18 ENCOUNTER — Encounter (HOSPITAL_BASED_OUTPATIENT_CLINIC_OR_DEPARTMENT_OTHER)
Admission: RE | Disposition: A | Discharge status: Home / Self Care | Payer: Self-pay | Source: Home / Self Care | Attending: Surgery

## 2024-08-18 DIAGNOSIS — C50212 Malignant neoplasm of upper-inner quadrant of left female breast: Secondary | ICD-10-CM

## 2024-08-18 DIAGNOSIS — J449 Chronic obstructive pulmonary disease, unspecified: Secondary | ICD-10-CM | POA: Diagnosis not present

## 2024-08-18 DIAGNOSIS — Z803 Family history of malignant neoplasm of breast: Secondary | ICD-10-CM | POA: Diagnosis not present

## 2024-08-18 DIAGNOSIS — Z806 Family history of leukemia: Secondary | ICD-10-CM | POA: Insufficient documentation

## 2024-08-18 DIAGNOSIS — C50912 Malignant neoplasm of unspecified site of left female breast: Secondary | ICD-10-CM | POA: Insufficient documentation

## 2024-08-18 DIAGNOSIS — Z170 Estrogen receptor positive status [ER+]: Secondary | ICD-10-CM

## 2024-08-18 DIAGNOSIS — Z452 Encounter for adjustment and management of vascular access device: Secondary | ICD-10-CM | POA: Diagnosis not present

## 2024-08-18 DIAGNOSIS — F419 Anxiety disorder, unspecified: Secondary | ICD-10-CM | POA: Diagnosis not present

## 2024-08-18 DIAGNOSIS — I10 Essential (primary) hypertension: Secondary | ICD-10-CM | POA: Diagnosis not present

## 2024-08-18 HISTORY — PX: PORTACATH PLACEMENT: SHX2246

## 2024-08-18 SURGERY — INSERTION, TUNNELED CENTRAL VENOUS DEVICE, WITH PORT
Anesthesia: General

## 2024-08-18 MED ORDER — HYDROMORPHONE HCL 1 MG/ML IJ SOLN
0.2500 mg | INTRAMUSCULAR | Status: DC | PRN
  Administered 2024-08-18: 0.5 mg via INTRAVENOUS

## 2024-08-18 MED ORDER — PROPOFOL 500 MG/50ML IV EMUL
INTRAVENOUS | Status: DC | PRN
  Administered 2024-08-18: 150 ug/kg/min via INTRAVENOUS

## 2024-08-18 MED ORDER — PROPOFOL 10 MG/ML IV BOLUS
INTRAVENOUS | Status: DC | PRN
  Administered 2024-08-18: 150 mg via INTRAVENOUS

## 2024-08-18 MED ORDER — FENTANYL CITRATE (PF) 100 MCG/2ML IJ SOLN
INTRAMUSCULAR | Status: AC
  Filled 2024-08-18: qty 2

## 2024-08-18 MED ORDER — MEPERIDINE HCL 25 MG/ML IJ SOLN: 6.2500 mg | INTRAMUSCULAR | Status: DC | PRN

## 2024-08-18 MED ORDER — LACTATED RINGERS IV SOLN: INTRAVENOUS | Status: DC

## 2024-08-18 MED ORDER — MIDAZOLAM HCL (PF) 2 MG/2ML IJ SOLN
INTRAMUSCULAR | Status: DC | PRN
  Administered 2024-08-18: 2 mg via INTRAVENOUS

## 2024-08-18 MED ORDER — ACETAMINOPHEN 500 MG PO TABS
ORAL_TABLET | ORAL | Status: AC
  Filled 2024-08-18: qty 2

## 2024-08-18 MED ORDER — DEXAMETHASONE SODIUM PHOSPHATE 4 MG/ML IJ SOLN
INTRAMUSCULAR | Status: DC | PRN
  Administered 2024-08-18: 5 mg via INTRAVENOUS

## 2024-08-18 MED ORDER — CHLORHEXIDINE GLUCONATE CLOTH 2 % EX PADS: 6.0000 | MEDICATED_PAD | Freq: Once | CUTANEOUS | Status: DC

## 2024-08-18 MED ORDER — FENTANYL CITRATE (PF) 100 MCG/2ML IJ SOLN
INTRAMUSCULAR | Status: DC | PRN
  Administered 2024-08-18: 25 ug via INTRAVENOUS
  Administered 2024-08-18: 50 ug via INTRAVENOUS

## 2024-08-18 MED ORDER — ONDANSETRON HCL 4 MG/2ML IJ SOLN
INTRAMUSCULAR | Status: DC | PRN
  Administered 2024-08-18: 4 mg via INTRAVENOUS

## 2024-08-18 MED ORDER — OXYCODONE HCL 5 MG/5ML PO SOLN: 5.0000 mg | Freq: Once | ORAL | Status: DC | PRN

## 2024-08-18 MED ORDER — ACETAMINOPHEN 500 MG PO TABS
1000.0000 mg | ORAL_TABLET | ORAL | Status: AC
  Administered 2024-08-18: 1000 mg via ORAL

## 2024-08-18 MED ORDER — MIDAZOLAM HCL 2 MG/2ML IJ SOLN
INTRAMUSCULAR | Status: AC
  Filled 2024-08-18: qty 2

## 2024-08-18 MED ORDER — HEPARIN SOD (PORK) LOCK FLUSH 100 UNIT/ML IV SOLN
INTRAVENOUS | Status: DC | PRN
  Administered 2024-08-18: 500 [IU]

## 2024-08-18 MED ORDER — OXYCODONE HCL 5 MG PO TABS: 5.0000 mg | ORAL_TABLET | Freq: Once | ORAL | Status: DC | PRN

## 2024-08-18 MED ORDER — OXYCODONE HCL 5 MG PO TABS: 5.0000 mg | ORAL_TABLET | Freq: Four times a day (QID) | ORAL | 0 refills | Status: DC | PRN

## 2024-08-18 MED ORDER — CEFAZOLIN SODIUM-DEXTROSE 2-4 GM/100ML-% IV SOLN
INTRAVENOUS | Status: AC
  Filled 2024-08-18: qty 100

## 2024-08-18 MED ORDER — HYDROMORPHONE HCL 1 MG/ML IJ SOLN
INTRAMUSCULAR | Status: AC
  Filled 2024-08-18: qty 0.5

## 2024-08-18 MED ORDER — LIDOCAINE HCL (CARDIAC) PF 100 MG/5ML IV SOSY
PREFILLED_SYRINGE | INTRAVENOUS | Status: DC | PRN
  Administered 2024-08-18: 50 mg via INTRAVENOUS

## 2024-08-18 MED ORDER — HEPARIN (PORCINE) IN NACL 2-0.9 UNITS/ML
INTRAMUSCULAR | Status: AC | PRN
  Administered 2024-08-18: 500 mL

## 2024-08-18 MED ORDER — AMISULPRIDE (ANTIEMETIC) 5 MG/2ML IV SOLN: 10.0000 mg | Freq: Once | INTRAVENOUS | Status: DC | PRN

## 2024-08-18 MED ORDER — CEFAZOLIN SODIUM-DEXTROSE 2-4 GM/100ML-% IV SOLN
2.0000 g | INTRAVENOUS | Status: AC
  Administered 2024-08-18: 2 g via INTRAVENOUS

## 2024-08-18 MED ORDER — BUPIVACAINE-EPINEPHRINE 0.5% -1:200000 IJ SOLN
INTRAMUSCULAR | Status: DC | PRN
  Administered 2024-08-18: 15 mL

## 2024-08-18 SURGICAL SUPPLY — 30 items
BAG DECANTER FOR FLEXI CONT (MISCELLANEOUS) ×2 IMPLANT
BLADE SURG 15 STRL LF DISP TIS (BLADE) ×2 IMPLANT
CANISTER SUCT 1200ML W/VALVE (MISCELLANEOUS) IMPLANT
CHLORAPREP W/TINT 26 (MISCELLANEOUS) ×2 IMPLANT
COVER BACK TABLE 60X90IN (DRAPES) ×2 IMPLANT
COVER MAYO STAND STRL (DRAPES) ×2 IMPLANT
DERMABOND ADVANCED .7 DNX12 (GAUZE/BANDAGES/DRESSINGS) ×4 IMPLANT
DRAPE C-ARM 42X72 X-RAY (DRAPES) ×2 IMPLANT
DRAPE LAPAROSCOPIC ABDOMINAL (DRAPES) ×2 IMPLANT
DRAPE UTILITY XL STRL (DRAPES) ×2 IMPLANT
ELECTRODE REM PT RTRN 9FT ADLT (ELECTROSURGICAL) ×2 IMPLANT
GAUZE 4X4 16PLY ~~LOC~~+RFID DBL (SPONGE) ×2 IMPLANT
GLOVE SURG SIGNA 7.5 PF LTX (GLOVE) ×2 IMPLANT
GOWN STRL REUS W/ TWL LRG LVL3 (GOWN DISPOSABLE) ×2 IMPLANT
GOWN STRL REUS W/ TWL XL LVL3 (GOWN DISPOSABLE) ×2 IMPLANT
KIT PORT POWER 8FR ISP CVUE (Port) IMPLANT
NDL HYPO 25X1 1.5 SAFETY (NEEDLE) ×2 IMPLANT
NEEDLE HYPO 25X1 1.5 SAFETY (NEEDLE) ×1 IMPLANT
PACK BASIN DAY SURGERY FS (CUSTOM PROCEDURE TRAY) ×2 IMPLANT
PENCIL SMOKE EVACUATOR (MISCELLANEOUS) ×2 IMPLANT
SLEEVE SCD COMPRESS KNEE MED (STOCKING) ×2 IMPLANT
SPIKE FLUID TRANSFER (MISCELLANEOUS) IMPLANT
SUT MNCRL AB 4-0 PS2 18 (SUTURE) ×2 IMPLANT
SUT PROLENE 2 0 SH DA (SUTURE) ×2 IMPLANT
SUT SILK 2 0 TIES 17X18 (SUTURE) IMPLANT
SUT VIC AB 3-0 SH 27X BRD (SUTURE) ×2 IMPLANT
SYR CONTROL 10ML LL (SYRINGE) ×2 IMPLANT
TOWEL GREEN STERILE FF (TOWEL DISPOSABLE) ×2 IMPLANT
TUBE CONNECTING 20X1/4 (TUBING) IMPLANT
YANKAUER SUCT BULB TIP NO VENT (SUCTIONS) IMPLANT

## 2024-08-18 NOTE — Op Note (Signed)
   Mary Mendez 08/18/2024   Pre-op Diagnosis: LEFT BREAST CANCER, CHEMOTHERAPY PLANNED     Post-op Diagnosis: same  Procedure(s): ULTRASOUND GUIDED RIGHT INTERNAL JUGULAR VEIN PORT-A-CATH INSERTION (8 FR)  Surgeon(s): Vernetta Berg, MD  Anesthesia: General  Staff:  Circulator: Wilmon Antonio SQUIBB, RN; Eliberto Geroge HERO, RN Radiology Technologist: Rena Elsie PARAS, RT Scrub Person: Ezzard Stephane HERO, CST; Lelon Daphne BROCKS, RN  Estimated Blood Loss: Minimal                Procedure: The patient was brought to the operating room and identified as the correct patient.  She was placed upon on the operating room table and general anesthesia was induced.  Her right neck and chest were then prepped and draped in usual sterile fashion.  The patient was placed in the Trendelenburg position.  Using the ultrasound I identified the internal jugular vein on the right side.  I anesthetized the skin with Marcaine  and then using the introducer needle cannulated the right internal jugular vein under ultrasound guidance.  I then passed a wire through the introducer needle and into the central venous system.  This was confirmed with fluoroscopy.  I then removed the needle.  I then identified an area on the chest for the port.  I anesthetized the skin w at the port site as well as the skin at the wire site on the neck with further Marcaine .  I then made an incision on her right upper chest with a scalpel.  I then dissected down to the subcutaneous tissue with the cautery and created a pocket using the cautery for the port.  An 8 French [place] port was brought onto the field and flushed appropriately.  The port fit easily into the pocket.  I then made an incision on the neck at the wire introduction site with a scalpel.  I then passed the introducer sheath and venous dilator over the wire and into the central venous system.  The wire and dilator were then removed.  I then used the tunneling device to tunnel  over the clavicle and up the neck from the port site to the neck incision.  I then pulled the catheter through this using the tunneling device.  I attached the catheter to the port and secured it appropriately.  I then cut the remaining catheter in appropriate length.  I then fed the catheter down the peel-away sheath slowly peeling away the sheath leaving the catheter in the central venous system.  I accessed the port and good flush and return were demonstrated with heparinized saline solution.  Fluoroscopy was performed again showing that the catheter appeared to be in the superior vena cava.  I again accessed the port and good flush and return redemonstrated.  I then instilled with concentrated heparin solution.  I sutured the port to the chest wall with a single 2-0 Prolene suture.  I then closed both incisions with interrupted 3-0 Vicryl sutures and 4-0 Monocryl sutures.  Dermabond was then applied.  The patient tolerated the procedure well.  All the counts were correct at the end of the procedure.  The patient was then extubated in the operating room and taken in a stable condition to the recovery room.          Berg Vernetta   Date: 08/18/2024  Time: 1:40 PM

## 2024-08-18 NOTE — Anesthesia Procedure Notes (Signed)
 Procedure Name: LMA Insertion Date/Time: 08/18/2024 12:57 PM  Performed by: Donnell Berwyn SQUIBB, CRNAPre-anesthesia Checklist: Patient identified, Emergency Drugs available, Suction available, Patient being monitored and Timeout performed Patient Re-evaluated:Patient Re-evaluated prior to induction Oxygen Delivery Method: Circle system utilized Preoxygenation: Pre-oxygenation with 100% oxygen Induction Type: IV induction Ventilation: Mask ventilation without difficulty LMA: LMA inserted LMA Size: 4.0 Number of attempts: 1 Placement Confirmation: positive ETCO2 and breath sounds checked- equal and bilateral Tube secured with: Tape Dental Injury: Teeth and Oropharynx as per pre-operative assessment

## 2024-08-18 NOTE — Anesthesia Preprocedure Evaluation (Addendum)
 Anesthesia Evaluation  Patient identified by MRN, date of birth, ID band Patient awake    Reviewed: Allergy & Precautions, NPO status , Patient's Chart, lab work & pertinent test results  History of Anesthesia Complications Negative for: history of anesthetic complications  Airway Mallampati: II  TM Distance: >3 FB Neck ROM: Full    Dental  (+) Dental Advisory Given   Pulmonary asthma , COPD,  COPD inhaler   breath sounds clear to auscultation       Cardiovascular hypertension, Pt. on medications (-) angina  Rhythm:Regular Rate:Normal     Neuro/Psych   Anxiety     negative neurological ROS     GI/Hepatic negative GI ROS, Neg liver ROS,,,  Endo/Other  BMI 37  Renal/GU negative Renal ROS     Musculoskeletal   Abdominal   Peds  Hematology Hb 13.9, plt 263k   Anesthesia Other Findings H/o breast cancer  Reproductive/Obstetrics                              Anesthesia Physical Anesthesia Plan  ASA: 3  Anesthesia Plan: General   Post-op Pain Management: Tylenol  PO (pre-op)*   Induction: Intravenous  PONV Risk Score and Plan: 3 and Ondansetron , Dexamethasone , Midazolam  and Treatment may vary due to age or medical condition  Airway Management Planned: LMA  Additional Equipment: None  Intra-op Plan:   Post-operative Plan: Extubation in OR  Informed Consent: I have reviewed the patients History and Physical, chart, labs and discussed the procedure including the risks, benefits and alternatives for the proposed anesthesia with the patient or authorized representative who has indicated his/her understanding and acceptance.     Dental advisory given  Plan Discussed with: CRNA and Surgeon  Anesthesia Plan Comments:          Anesthesia Quick Evaluation

## 2024-08-18 NOTE — Interval H&P Note (Signed)
 History and Physical Interval Note: no change in H and P  08/18/2024 10:59 AM  Mary Mendez  has presented today for surgery, with the diagnosis of LEFT BREAST CANCER.  The various methods of treatment have been discussed with the patient and family. After consideration of risks, benefits and other options for treatment, the patient has consented to  Procedure(s) with comments: INSERTION, TUNNELED CENTRAL VENOUS DEVICE, WITH PORT (N/A) - PORT PLACEMENT WITH ULTRASOUND GUIDANCE as a surgical intervention.  The patient's history has been reviewed, patient examined, no change in status, stable for surgery.  I have reviewed the patient's chart and labs.  Questions were answered to the patient's satisfaction.     Vicenta Poli

## 2024-08-18 NOTE — Anesthesia Postprocedure Evaluation (Signed)
 Anesthesia Post Note  Patient: Mary Mendez  Procedure(s) Performed: INSERTION, TUNNELED CENTRAL VENOUS DEVICE, WITH PORT     Patient location during evaluation: PACU Anesthesia Type: General Level of consciousness: awake and alert Pain management: pain level controlled Vital Signs Assessment: post-procedure vital signs reviewed and stable Respiratory status: spontaneous breathing, nonlabored ventilation and respiratory function stable Cardiovascular status: blood pressure returned to baseline and stable Postop Assessment: no apparent nausea or vomiting Anesthetic complications: no   No notable events documented.  Last Vitals:  Vitals:   08/18/24 1415 08/18/24 1430  BP: 129/72 128/76  Pulse: 65 67  Resp: 13 16  Temp:  36.5 C  SpO2: 96% 98%    Last Pain:  Vitals:   08/18/24 1430  TempSrc:   PainSc: 1                  Butler Levander Pinal

## 2024-08-18 NOTE — Transfer of Care (Signed)
 Immediate Anesthesia Transfer of Care Note  Patient: Mary Mendez  Procedure(s) Performed: INSERTION, TUNNELED CENTRAL VENOUS DEVICE, WITH PORT  Patient Location: PACU  Anesthesia Type:General  Level of Consciousness: awake, alert , and patient cooperative  Airway & Oxygen Therapy: Patient Spontanous Breathing and Patient connected to nasal cannula oxygen  Post-op Assessment: Report given to RN and Post -op Vital signs reviewed and stable  Post vital signs: Reviewed and stable  Last Vitals:  Vitals Value Taken Time  BP    Temp 36.2 C 08/18/24 13:48  Pulse 75 08/18/24 13:49  Resp 20 08/18/24 13:49  SpO2 99 % 08/18/24 13:49  Vitals shown include unfiled device data.  Last Pain:  Vitals:   08/18/24 1011  TempSrc: Temporal  PainSc: 0-No pain      Patients Stated Pain Goal: 4 (08/18/24 1011)  Complications: No notable events documented.

## 2024-08-18 NOTE — Discharge Instructions (Addendum)
 You may shower starting tomorrow  Exercising Tylenol  and ice pack also for pain  No vigorous activity for 1 week     Post Anesthesia Home Care Instructions  Activity: Get plenty of rest for the remainder of the day. A responsible individual must stay with you for 24 hours following the procedure.  For the next 24 hours, DO NOT: -Drive a car -Advertising copywriter -Drink alcoholic beverages -Take any medication unless instructed by your physician -Make any legal decisions or sign important papers.  Meals: Start with liquid foods such as gelatin or soup. Progress to regular foods as tolerated. Avoid greasy, spicy, heavy foods. If nausea and/or vomiting occur, drink only clear liquids until the nausea and/or vomiting subsides. Call your physician if vomiting continues.  Special Instructions/Symptoms: Your throat may feel dry or sore from the anesthesia or the breathing tube placed in your throat during surgery. If this causes discomfort, gargle with warm salt water. The discomfort should disappear within 24 hours.  If you had a scopolamine  patch placed behind your ear for the management of post- operative nausea and/or vomiting:  1. The medication in the patch is effective for 72 hours, after which it should be removed.  Wrap patch in a tissue and discard in the trash. Wash hands thoroughly with soap and water. 2. You may remove the patch earlier than 72 hours if you experience unpleasant side effects which may include dry mouth, dizziness or visual disturbances. 3. Avoid touching the patch. Wash your hands with soap and water after contact with the patch.      Next dose of Tylenol  may be given at 4:15pm if needed.

## 2024-08-19 ENCOUNTER — Encounter (HOSPITAL_BASED_OUTPATIENT_CLINIC_OR_DEPARTMENT_OTHER): Payer: Self-pay | Admitting: Surgery

## 2024-08-21 ENCOUNTER — Inpatient Hospital Stay

## 2024-08-21 ENCOUNTER — Other Ambulatory Visit: Payer: Self-pay

## 2024-08-21 ENCOUNTER — Inpatient Hospital Stay: Admitting: Pharmacist

## 2024-08-21 ENCOUNTER — Other Ambulatory Visit: Payer: Self-pay | Admitting: Pharmacist

## 2024-08-21 DIAGNOSIS — Z90710 Acquired absence of both cervix and uterus: Secondary | ICD-10-CM | POA: Diagnosis not present

## 2024-08-21 DIAGNOSIS — Z1732 Human epidermal growth factor receptor 2 negative status: Secondary | ICD-10-CM | POA: Diagnosis not present

## 2024-08-21 DIAGNOSIS — C50212 Malignant neoplasm of upper-inner quadrant of left female breast: Secondary | ICD-10-CM

## 2024-08-21 DIAGNOSIS — Z806 Family history of leukemia: Secondary | ICD-10-CM | POA: Diagnosis not present

## 2024-08-21 DIAGNOSIS — Z803 Family history of malignant neoplasm of breast: Secondary | ICD-10-CM | POA: Diagnosis not present

## 2024-08-21 DIAGNOSIS — Z170 Estrogen receptor positive status [ER+]: Secondary | ICD-10-CM | POA: Diagnosis not present

## 2024-08-21 DIAGNOSIS — Z1721 Progesterone receptor positive status: Secondary | ICD-10-CM | POA: Diagnosis not present

## 2024-08-21 MED ORDER — DEXAMETHASONE 4 MG PO TABS: ORAL_TABLET | ORAL | 1 refills | Status: DC

## 2024-08-21 MED ORDER — ONDANSETRON HCL 8 MG PO TABS: 8.0000 mg | ORAL_TABLET | Freq: Three times a day (TID) | ORAL | 1 refills | Status: AC | PRN

## 2024-08-21 MED ORDER — LIDOCAINE-PRILOCAINE 2.5-2.5 % EX CREA: TOPICAL_CREAM | CUTANEOUS | 3 refills | Status: AC

## 2024-08-21 MED ORDER — PROCHLORPERAZINE MALEATE 10 MG PO TABS: 10.0000 mg | ORAL_TABLET | Freq: Four times a day (QID) | ORAL | 1 refills | Status: DC | PRN

## 2024-08-21 NOTE — Progress Notes (Addendum)
  [facility]       Telephone: [phone]?Fax: [phone]   Oncology Clinical Pharmacist Practitioner Initial Assessment  Mary Mendez is a 63 y.o. female with a diagnosis of breast cancer. They were contacted today via in-person visit.  Indication/Regimen Docetaxel (Taxotere) and cyclophosphamide (Cytoxan) are being used appropriately for treatment of breast cancer by Dr. Vinay Gudena.    Wt Readings from Last 1 Encounters:  08/18/24 217 lb 13 oz (98.8 kg)    Estimated body surface area is 2.13 meters squared as calculated from the following:   Height as of 08/18/24: 5' 5 (1.651 m).   Weight as of 08/18/24: 217 lb 13 oz (98.8 kg).  The dosing regimen cycle is every 21 days x 4 cycles  Docetaxel (75 mg/m2) on Day 1 Cyclophosphamide (600 mg/m2) on Day 1 Pegfilgrastim (6 mg) on Day 3  It is planned to continue until treatment plan completion or unacceptable toxicity. The tentative start date is: 09/04/2024  Dose Modifications None at this time   Allergies No Known Allergies  Vitals    08/18/2024    2:30 PM 08/18/2024    2:15 PM 08/18/2024    2:12 PM  Oncology Vitals  Temp 97.7 F (36.5 C)    Pulse Rate 67 65 68  BP 128/76 129/72 129/72  Resp 16 13 15   SpO2 98 % 96 % 96 %    Laboratory Data    Latest Ref Rng & Units 07/14/2024    2:53 PM 06/11/2024   12:34 PM 02/17/2022   11:33 PM  CBC EXTENDED  WBC 4.0 - 10.5 K/uL 10.6  9.6  8.4   RBC 3.87 - 5.11 MIL/uL 4.80  5.16  4.58   Hemoglobin 12.0 - 15.0 g/dL 86.0  84.9  86.0   HCT 36.0 - 46.0 % 42.4  43.9  41.5   Platelets 150 - 400 K/uL 263  284  248   NEUT# 1.7 - 7.7 K/uL  5.8  5.0   Lymph# 0.7 - 4.0 K/uL  2.8  2.5        Latest Ref Rng & Units 07/14/2024    2:53 PM 06/11/2024   12:34 PM 02/17/2022   11:33 PM  CMP  Glucose 70 - 99 mg/dL 896  87  869   BUN 8 - 23 mg/dL 14  15  15    Creatinine 0.44 - 1.00 mg/dL 9.15  9.25  9.10   Sodium 135 - 145 mmol/L 138  140  141   Potassium 3.5 -  5.1 mmol/L 4.3  4.3  4.2   Chloride 98 - 111 mmol/L 105  105  109   CO2 22 - 32 mmol/L 26  28  25    Calcium 8.9 - 10.3 mg/dL 8.8  9.7  9.0   Total Protein 6.5 - 8.1 g/dL  7.5  6.3   Total Bilirubin 0.0 - 1.2 mg/dL  0.6  0.7   Alkaline Phos 38 - 126 U/L  87  75   AST 15 - 41 U/L  16  47   ALT 0 - 44 U/L  19  48    No results found for: MG No results found for: CA2729   Contraindications Contraindications were reviewed? Yes Contraindications to therapy were identified? No   Safety Precautions The following safety precautions were reviewed:  Fever: reviewed the importance of having a thermometer and the Centers for Disease Control and Prevention (CDC) definition of fever which is 100.10F (38C) or  higher. Patient should call 24/7 triage at [phone] if experiencing a fever or any other symptoms Decreased white blood cells (WBCs) and increased risk for infection Decreased platelet count and increased risk of bleeding Decreased hemoglobin, part of the red blood cells that carry iron and oxygen Hair Loss Fatigue Fluid retention or swelling (edema) Peripheral Neuropathy Mouth sores Rash or itchy skin Muscle or joint pain or weakness Nausea or vomiting Diarrhea Nail Changes Hypersensitivity reactions Secondary Malignancies Hemorrhagic cystitis Pneumonitis Handling body fluids and waste Intimacy, sexual activity, contraception, fertility  Medication Reconciliation Current Outpatient Medications  Medication Sig Dispense Refill   acetaminophen  (TYLENOL ) 500 MG tablet Take 1,000 mg by mouth every 6 (six) hours as needed for mild pain.     amLODipine (NORVASC) 5 MG tablet Take 5 mg by mouth daily. (Patient taking differently: Take 10 mg by mouth daily.)     busPIRone (BUSPAR) 10 MG tablet Take 10 mg by mouth 3 (three) times daily as needed (Anxiety).     rosuvastatin (CRESTOR) 10 MG tablet Take 10 mg by mouth daily.     tretinoin (RETIN-A) 0.1 % cream Apply 1 application   topically at bedtime.     albuterol  (PROVENTIL ) (2.5 MG/3ML) 0.083% nebulizer solution Take 3 mLs (2.5 mg total) by nebulization every 6 (six) hours as needed for wheezing or shortness of breath. 75 mL 12   dexamethasone  (DECADRON ) 4 MG tablet Take 2 tabs by mouth 2 times daily starting day before chemo. Then take 2 tabs daily for 2 days starting day after chemo. Take with food. 30 tablet 1   lidocaine -prilocaine (EMLA) cream Apply to affected area once 30 g 3   omega-3 acid ethyl esters (LOVAZA) 1 g capsule Take 1 g by mouth daily.     ondansetron  (ZOFRAN ) 8 MG tablet Take 1 tablet (8 mg total) by mouth every 8 (eight) hours as needed for nausea or vomiting. Start on the third day after chemotherapy. 30 tablet 1   oxyCODONE  (OXY IR/ROXICODONE ) 5 MG immediate release tablet Take 1 tablet (5 mg total) by mouth every 6 (six) hours as needed for moderate pain (pain score 4-6) or severe pain (pain score 7-10). 15 tablet 0   prochlorperazine  (COMPAZINE ) 10 MG tablet Take 1 tablet (10 mg total) by mouth every 6 (six) hours as needed for nausea or vomiting. 30 tablet 1   No current facility-administered medications for this visit.   Medication reconciliation is based on the patient's most recent medication list in the electronic medical record (EMR) including herbal products and OTC medications.   The patient's medication list was reviewed today with the patient? Yes   Drug-drug interactions (DDIs) DDIs were evaluated? Yes Significant DDIs identified? No   Drug-Food Interactions Drug-food interactions were evaluated? Yes Drug-food interactions identified? No   Follow-up Plan  Treatment start date: 09/04/2024 Port placement date: 08/18/2024 Per patient request, added additional time to infusion appts for DigniCap. We reviewed the prescriptions, premedications, and treatment regimen with the patient. Possible side effects of the treatment regimen were reviewed and management strategies were  discussed.  Can use loperamide as needed for diarrhea, loratadine as needed for G-CSF bone pain, and Senna-S as needed for constipation.  Clinical pharmacy will assist Dr. Vinay Gudena and Alison A Felker on an as needed basis going forward  Mary Mendez participated in the discussion, expressed understanding, and voiced agreement with the above plan. All questions were answered to her satisfaction. The patient was advised to contact the  clinic at [phone] with any questions or concerns prior to her return visit.   I spent 60 minutes assessing the patient.  John A. Lucila, PharmD, BCOP, CPP  Norleen DELENA Lucila, RPH-CPP, 08/21/2024 5:12 PM  **Disclaimer: This note was dictated with voice recognition software. Similar sounding words can inadvertently be transcribed and this note may contain transcription errors which may not have been corrected upon publication of note.**

## 2024-08-22 ENCOUNTER — Inpatient Hospital Stay

## 2024-08-25 DIAGNOSIS — C50212 Malignant neoplasm of upper-inner quadrant of left female breast: Secondary | ICD-10-CM | POA: Diagnosis not present

## 2024-08-25 DIAGNOSIS — Z23 Encounter for immunization: Secondary | ICD-10-CM | POA: Diagnosis not present

## 2024-08-25 DIAGNOSIS — Z9889 Other specified postprocedural states: Secondary | ICD-10-CM | POA: Diagnosis not present

## 2024-08-25 DIAGNOSIS — R5383 Other fatigue: Secondary | ICD-10-CM | POA: Diagnosis not present

## 2024-08-25 DIAGNOSIS — E785 Hyperlipidemia, unspecified: Secondary | ICD-10-CM | POA: Diagnosis not present

## 2024-08-25 DIAGNOSIS — Z7185 Encounter for immunization safety counseling: Secondary | ICD-10-CM | POA: Diagnosis not present

## 2024-08-25 DIAGNOSIS — E782 Mixed hyperlipidemia: Secondary | ICD-10-CM | POA: Diagnosis not present

## 2024-08-25 DIAGNOSIS — I10 Essential (primary) hypertension: Secondary | ICD-10-CM | POA: Diagnosis not present

## 2024-08-25 DIAGNOSIS — Z170 Estrogen receptor positive status [ER+]: Secondary | ICD-10-CM | POA: Diagnosis not present

## 2024-08-25 DIAGNOSIS — Z79899 Other long term (current) drug therapy: Secondary | ICD-10-CM | POA: Diagnosis not present

## 2024-08-25 DIAGNOSIS — Z789 Other specified health status: Secondary | ICD-10-CM | POA: Diagnosis not present

## 2024-08-27 DIAGNOSIS — H2513 Age-related nuclear cataract, bilateral: Secondary | ICD-10-CM | POA: Diagnosis not present

## 2024-08-28 ENCOUNTER — Encounter: Payer: Self-pay | Admitting: *Deleted

## 2024-08-29 NOTE — Progress Notes (Addendum)
 Pharmacist Chemotherapy Monitoring - Initial Assessment    Anticipated start date: 09/04/24   The following has been reviewed per standard work regarding the patient's treatment regimen: The patient's diagnosis, treatment plan and drug doses, and organ/hematologic function Lab orders and baseline tests specific to treatment regimen  The treatment plan start date, drug sequencing, and pre-medications Prior authorization status  Patient's documented medication list, including drug-drug interaction screen and prescriptions for anti-emetics and supportive care specific to the treatment regimen The drug concentrations, fluid compatibility, administration routes, and timing of the medications to be used The patient's access for treatment and lifetime cumulative dose history, if applicable  The patient's medication allergies and previous infusion related reactions, if applicable   Changes made to treatment plan:  treatment plan date  Follow up needed:  N/A  Harlene JONELLE Nasuti, RPH, 08/29/2024  12:23 PM

## 2024-09-01 DIAGNOSIS — Z0000 Encounter for general adult medical examination without abnormal findings: Secondary | ICD-10-CM | POA: Diagnosis not present

## 2024-09-02 ENCOUNTER — Telehealth: Payer: Self-pay

## 2024-09-02 DIAGNOSIS — Z170 Estrogen receptor positive status [ER+]: Secondary | ICD-10-CM | POA: Diagnosis not present

## 2024-09-02 DIAGNOSIS — Z9889 Other specified postprocedural states: Secondary | ICD-10-CM | POA: Diagnosis not present

## 2024-09-02 DIAGNOSIS — C50212 Malignant neoplasm of upper-inner quadrant of left female breast: Secondary | ICD-10-CM | POA: Diagnosis not present

## 2024-09-02 NOTE — Progress Notes (Signed)
 Subjective Patient ID: Mary Mendez is a 63 y.o. female.   HPI  5 weeks post op. Returns for evaluation right T junction wound; seen by PCP yesterday and this was discussed. She is scheduled to start chemotherapy 11.5, 11.6.25  Presented following screening MMG with possible left breast asymmetry. Diagnostic MMG/US  showed 7 mm mass left breast 10 o'clock left breast 8 cmfn; a vague 4 mm shadowing lesion in the 10:30 o'clock left breast 10 cmfn; and a 2.5 cm group of calcifications in the medial left breast. No abnormal left axillary LN. Biopsies labeled upper inner left breast mass showed IDC, ER/PR+, Her2-, left breast calcifications intermediate grade DCIS.   The two areas biopsied are 5.3 cm apart. MRI demonstrated 0.6 cm mass left UIQ, consistent with biopsy-proven IDC and left UIQ 3.9 cm span NME consistent with biopsy-proven DCIS.  Patient underwent left lumpectomy SLN with final pathology ribbon clip 1 cm grade 3 IDC margins clear, left breast X clip DCIS with necrosis margins clear, 0/3 SLN. Oncotype 46.   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 Mother with breast ca, passed away age 49.  Prior DD. Reports left lumpectomy 1980 benign. No weight recorded on lumpectomy specimen. 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 Lives with spouse.   Review of Systems  Objective Physical Exam  Right chest port Breasts: incisions intact with exception few mm right T junction scant drainage, no cellulitis, open wound right areola inset have nearly healed and are dry   Assessment/Plan  Malignant neoplasm of upper-inner quadrant of left breast in female, ER+ s/p lumpectomy SLN S/p left oncoplastic reconstruction right breast reduction  Reassured patient small wound T junction was present last visit and is smaller today. No evidence infection. Ok to proceed with chemotherapy as planned from my  perspective. Reviewed the larger wounds at her last visit were at areola inset and these have nearly healed.   Has scheduled follow up

## 2024-09-02 NOTE — Telephone Encounter (Signed)
 Spoke with patient and confirmed appointment on 11/4

## 2024-09-03 ENCOUNTER — Inpatient Hospital Stay

## 2024-09-03 ENCOUNTER — Inpatient Hospital Stay: Attending: Hematology and Oncology | Admitting: Hematology and Oncology

## 2024-09-03 VITALS — BP 150/74 | HR 101 | Temp 98.9°F | Resp 18 | Wt 219.6 lb

## 2024-09-03 DIAGNOSIS — Z170 Estrogen receptor positive status [ER+]: Secondary | ICD-10-CM | POA: Diagnosis not present

## 2024-09-03 DIAGNOSIS — R110 Nausea: Secondary | ICD-10-CM | POA: Diagnosis not present

## 2024-09-03 DIAGNOSIS — C50212 Malignant neoplasm of upper-inner quadrant of left female breast: Secondary | ICD-10-CM

## 2024-09-03 DIAGNOSIS — Z1732 Human epidermal growth factor receptor 2 negative status: Secondary | ICD-10-CM | POA: Diagnosis not present

## 2024-09-03 DIAGNOSIS — R609 Edema, unspecified: Secondary | ICD-10-CM | POA: Diagnosis not present

## 2024-09-03 DIAGNOSIS — R432 Parageusia: Secondary | ICD-10-CM | POA: Diagnosis not present

## 2024-09-03 DIAGNOSIS — R21 Rash and other nonspecific skin eruption: Secondary | ICD-10-CM | POA: Diagnosis not present

## 2024-09-03 DIAGNOSIS — E860 Dehydration: Secondary | ICD-10-CM | POA: Insufficient documentation

## 2024-09-03 DIAGNOSIS — Z90710 Acquired absence of both cervix and uterus: Secondary | ICD-10-CM | POA: Diagnosis not present

## 2024-09-03 DIAGNOSIS — Z5111 Encounter for antineoplastic chemotherapy: Secondary | ICD-10-CM | POA: Diagnosis not present

## 2024-09-03 DIAGNOSIS — Z806 Family history of leukemia: Secondary | ICD-10-CM | POA: Diagnosis not present

## 2024-09-03 DIAGNOSIS — Z803 Family history of malignant neoplasm of breast: Secondary | ICD-10-CM | POA: Diagnosis not present

## 2024-09-03 DIAGNOSIS — Z5189 Encounter for other specified aftercare: Secondary | ICD-10-CM | POA: Insufficient documentation

## 2024-09-03 DIAGNOSIS — Z1721 Progesterone receptor positive status: Secondary | ICD-10-CM | POA: Insufficient documentation

## 2024-09-03 LAB — CMP (CANCER CENTER ONLY)
ALT: 12 U/L (ref 0–44)
AST: 14 U/L — ABNORMAL LOW (ref 15–41)
Albumin: 4.1 g/dL (ref 3.5–5.0)
Alkaline Phosphatase: 74 U/L (ref 38–126)
Anion gap: 9 (ref 5–15)
BUN: 17 mg/dL (ref 8–23)
CO2: 24 mmol/L (ref 22–32)
Calcium: 8.9 mg/dL (ref 8.9–10.3)
Chloride: 105 mmol/L (ref 98–111)
Creatinine: 0.82 mg/dL (ref 0.44–1.00)
GFR, Estimated: >60 mL/min (ref >60)
Glucose, Bld: 91 mg/dL (ref 70–99)
Potassium: 3.9 mmol/L (ref 3.5–5.1)
Sodium: 138 mmol/L (ref 135–145)
Total Bilirubin: 0.4 mg/dL (ref 0.0–1.2)
Total Protein: 7.2 g/dL (ref 6.5–8.1)

## 2024-09-03 LAB — CBC WITH DIFFERENTIAL (CANCER CENTER ONLY)
Abs Immature Granulocytes: 0.04 K/uL (ref 0.00–0.07)
Basophils Absolute: 0.1 K/uL (ref 0.0–0.1)
Basophils Relative: 1 %
Eosinophils Absolute: 0.2 K/uL (ref 0.0–0.5)
Eosinophils Relative: 2 %
HCT: 40 % (ref 36.0–46.0)
Hemoglobin: 13.6 g/dL (ref 12.0–15.0)
Immature Granulocytes: 0 %
Lymphocytes Relative: 34 %
Lymphs Abs: 3.5 K/uL (ref 0.7–4.0)
MCH: 28.1 pg (ref 26.0–34.0)
MCHC: 34 g/dL (ref 30.0–36.0)
MCV: 82.6 fL (ref 80.0–100.0)
Monocytes Absolute: 0.7 K/uL (ref 0.1–1.0)
Monocytes Relative: 7 %
Neutro Abs: 5.6 K/uL (ref 1.7–7.7)
Neutrophils Relative %: 56 %
Platelet Count: 275 K/uL (ref 150–400)
RBC: 4.84 MIL/uL (ref 3.87–5.11)
RDW: 14.6 % (ref 11.5–15.5)
WBC Count: 10.1 K/uL (ref 4.0–10.5)
nRBC: 0 % (ref 0.0–0.2)

## 2024-09-03 MED ORDER — NYSTATIN 100000 UNIT/GM EX POWD: 1.0000 | Freq: Three times a day (TID) | CUTANEOUS | 0 refills | Status: AC

## 2024-09-03 NOTE — Progress Notes (Signed)
 [facility] CONSULT NOTE  Patient Care Team: Cristopher Suzen HERO, NP as PCP - General Cristopher Suzen HERO, NP Tyree Nanetta SAILOR, RN as Oncology Nurse Navigator Gerome, Devere HERO, RN as Oncology Nurse Navigator Vernetta Berg, MD as Consulting Physician (General Surgery) Loretha Ash, MD as Consulting Physician (Hematology and Oncology) Izell Domino, MD as Attending Physician (Radiation Oncology)  CHIEF COMPLAINTS/PURPOSE OF CONSULTATION:  Newly diagnosed breast cancer  HISTORY OF PRESENTING ILLNESS:  Mary Mendez 63 y.o. female is here because of recent diagnosis of left breast IDC  I reviewed her records extensively and collaborated the history with the patient.  SUMMARY OF ONCOLOGIC HISTORY: Oncology History  Malignant neoplasm of upper-inner quadrant of left breast in female, estrogen receptor positive (HCC)  06/03/2024 Pathology Results   Breast, left, needle core biopsy, mass with calcifications 5mm upper inner quadrant (ribbon clip) :      INVASIVE MAMMARY CARCINOMA      TUBULE FORMATION: SCORE 3      NUCLEAR PLEOMORPHISM: SCORE 2      MITOTIC COUNT: SCORE 1      TOTAL SCORE: 6      OVERALL GRADE: 2      LYMPHOVASCULAR INVASION: NOT IDENTIFIED      CANCER LENGTH: 5.5 MM      CALCIFICATIONS: PRESENT      OTHER FINDINGS: NONE      SEE NOTE       2. Breast, left, needle core biopsy, 2.5cm group upper inner quadrant (x clip) :      DUCTAL CARCINOMA IN SITU, INTERMEDIATE GRADE, SOLID TYPE      NECROSIS: PRESENT      CALCIFICATIONS: PRESENT      DCIS LENGTH: 1.0 CM / 10 MM      SEE NOTE  IMMUNOHISTOCHEMICAL AND MORPHOMETRIC ANALYSIS PERFORMED MANUALLY The tumor cells are NEGATIVE for Her2 (0). Estrogen Receptor:  100%, POSITIVE, STRONG STAINING INTENSITY Progesterone Receptor:  20%, POSITIVE, MODERATE STAINING INTENSITY Proliferation Marker Ki67:  60%     06/04/2024 Mammogram   Suspicious findings. The suggested screening mammographic asymmetry is  consistent with a small irregular mass/focal asymmetry with a few associated calcifications projecting in the upper inner left breast measuring 5 mm mammographically. This may correspond to the hypoechoic mass seen on sonography at 10 o'clock, 8 cm the nipple or to the second sonographic abnormality, at 10:30 o'clock, 10 cm from the nipple. This latter finding is smaller and more subtle. Given the uncertainty of which sonographic finding may be the correlate to the mammographic abnormality, stereotactic biopsy of the mammographic finding is recommended.  2.5 cm group of suspicious calcifications in the medial left breast, anterior to the above described small irregular mass/focal asymmetry. Stereotactic core needle biopsy is recommended.   06/09/2024 Initial Diagnosis   Malignant neoplasm of upper-inner quadrant of left breast in female, estrogen receptor positive (HCC)   06/11/2024 Cancer Staging   Staging form: Breast, AJCC 8th Edition - Clinical stage from 06/11/2024: Stage IA (cT1b, cN0, cM0, G2, ER+, PR+, HER2-) - Signed by Loretha Ash, MD on 06/11/2024 Stage prefix: Initial diagnosis Histologic grading system: 3 grade system Laterality: Left Staged by: Pathologist and managing physician Stage used in treatment planning: Yes National guidelines used in treatment planning: Yes Type of national guideline used in treatment planning: NCCN    Genetic Testing   [facility] CancerNext-Expanded Panel+RNA was Negative. 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 Report date is 06/18/2024.  The CancerNext-Expanded gene panel offered by [facility] and includes sequencing, rearrangement, and RNA analysis for the following 77 genes: AIP, ALK, APC, ATM, AXIN2, BAP1, BARD1, BMPR1A, BRCA1, BRCA2, BRIP1, CDC73, CDH1, CDK4, CDKN1B, CDKN2A, CEBPA, CHEK2, CTNNA1, DDX41, DICER1, ETV6, FH, FLCN, GATA2, LZTR1, MAX, MBD4, MEN1, MET, MLH1, MSH2, MSH3, MSH6, MUTYH,  NF1, NF2, NTHL1, PALB2, PHOX2B, PMS2, POT1, PRKAR1A, PTCH1, PTEN, RAD51C, RAD51D, RB1, RET, RPS20, RUNX1, SDHA, SDHAF2, SDHB, SDHC, SDHD, SMAD4, SMARCA4, SMARCB1, SMARCE1, STK11, SUFU, TMEM127, TP53, TSC1, TSC2, VHL, and WT1 (sequencing and deletion/duplication); EGFR, HOXB13, KIT, MITF, PDGFRA, POLD1, and POLE (sequencing only); EPCAM and GREM1 (deletion/duplication only).     09/04/2024 -  Chemotherapy   Patient is on Treatment Plan : BREAST TC q21d       Discussed the use of AI scribe software for clinical note transcription with the patient, who gave verbal consent to proceed.  History of Present Illness  Mary Mendez is a 63 year old female with breast cancer who presents before  her first chemotherapy session.  She is scheduled to begin her first round of chemotherapy tomorrow afternoon. A port is in place for the administration of chemotherapy. She is prepared with a cold cap to manage potential hair loss. Nausea medications are available, and she will take steroids for three days post-treatment.  She runderwent bilateral mammoplasty and reports that her breasts are healing well. There was a minor opening in one area, and her surgeon told her it was superficial and that she could proceed with chemotherapy. She has ordered powder to help manage this and will apply it a couple of times a day.  She was informed that she might start feeling tired next week and was advised to monitor for any signs of neuropathy, such as tingling and numbness in her hands and feet.     MEDICAL HISTORY:  Past Medical History:  Diagnosis Date   Anxiety    Asthma    Bronchitis    Cancer (HCC) 06/2024   Left breast IDC   COVID-19 08/2021   Hyperlipidemia    Hypertension    Pneumonia     SURGICAL HISTORY: Past Surgical History:  Procedure Laterality Date   ABDOMINAL HYSTERECTOMY  2010   and left ovaries;   BREAST BIOPSY     BREAST BIOPSY Left 06/04/2024   MM LT BREAST BX W LOC DEV 1ST LESION  IMAGE BX SPEC STEREO GUIDE 06/04/2024 [facility]   BREAST BIOPSY Left 06/04/2024   MM LT BREAST BX W LOC DEV EA AD LESION IMG BX SPEC STEREO GUIDE 06/04/2024 [facility]   BREAST BIOPSY  07/15/2024   MM LT RADIOACTIVE SEED LOC MAMMO GUIDE 07/15/2024 [facility]   BREAST BIOPSY  07/15/2024   MM LT RADIOACTIVE SEED EA ADD LESION LOC MAMMO GUIDE 07/15/2024 [facility]   BREAST BIOPSY  07/15/2024   MM LT RADIOACTIVE SEED EA ADD LESION LOC MAMMO GUIDE 07/15/2024 [facility]   BREAST BIOPSY  07/15/2024   MM LT RADIOACTIVE SEED EA ADD LESION LOC MAMMO GUIDE 07/15/2024 [facility]   BREAST EXCISIONAL BIOPSY Left    BREAST LUMPECTOMY WITH RADIOACTIVE SEED AND SENTINEL LYMPH NODE BIOPSY Left 07/17/2024   Procedure: BREAST LUMPECTOMY WITH RADIOACTIVE SEED AND SENTINEL LYMPH NODE BIOPSY;  Surgeon: Vernetta Berg, MD;  Location: [facility] OR;  Service: General;  Laterality: Left;  RADIOACTIVE SEED GUIDED LEFT BREAST BRACKETED LUMPECTOMY AND SENTINEL NODE BIOPSY   BREAST REDUCTION SURGERY Bilateral 07/25/2024   Procedure: MAMMOPLASTY, REDUCTION;  Surgeon: Arelia Filippo, MD;  Location: [facility];  Service: Plastics;  Laterality: Bilateral;   BREAST SURGERY     CHOLECYSTECTOMY N/A 02/18/2022   Procedure: LAPAROSCOPIC CHOLECYSTECTOMY;  Surgeon: Signe Mitzie LABOR, MD;  Location: [facility] OR;  Service: General;  Laterality: N/A;   EXCISION, MASS, UPPER EXTREMITY Left 07/17/2024   Procedure: EXCISION, MASS, UPPER EXTREMITY;  Surgeon: Vernetta Berg, MD;  Location: [facility] OR;  Service: General;  Laterality: Left;  EXCISION LEFT SHOULDER MASS   HYSTEROTOMY     PORTACATH PLACEMENT N/A 08/18/2024   Procedure: INSERTION, TUNNELED CENTRAL VENOUS DEVICE, WITH PORT;  Surgeon: Vernetta Berg, MD;  Location: [facility];  Service: General;  Laterality: N/A;  PORT PLACEMENT WITH ULTRASOUND GUIDANCE    SOCIAL HISTORY: Social History   Socioeconomic History    Marital status: Married    Spouse name: Not on file   Number of children: Not on file   Years of education: Not on file   Highest education level: Not on file  Occupational History   Not on file  Tobacco Use   Smoking status: Never    Passive exposure: Never   Smokeless tobacco: Never  Vaping Use   Vaping status: Never Used  Substance and Sexual Activity   Alcohol use: Yes    Comment: social   Drug use: Never   Sexual activity: Not Currently    Birth control/protection: Surgical    Comment: hyst  Other Topics Concern   Not on file  Social History Narrative   Not on file   Social Drivers of Health   Financial Resource Strain: Not on file  Food Insecurity: No Food Insecurity (06/11/2024)   Hunger Vital Sign    Worried About Running Out of Food in the Last Year: Never true    Ran Out of Food in the Last Year: Never true  Transportation Needs: No Transportation Needs (06/11/2024)   PRAPARE - Administrator, Civil Service (Medical): No    Lack of Transportation (Non-Medical): No  Physical Activity: Not on file  Stress: Not on file  Social Connections: Not on file  Intimate Partner Violence: Not At Risk (06/11/2024)   Humiliation, Afraid, Rape, and Kick questionnaire    Fear of Current or Ex-Partner: No    Emotionally Abused: No    Physically Abused: No    Sexually Abused: No    FAMILY HISTORY: Family History  Problem Relation Age of Onset   Breast cancer Mother 75   Emphysema Father    Leukemia Maternal Aunt 83 - 79   Asthma Paternal Uncle    Colon cancer Neg Hx    Colon polyps Neg Hx    Esophageal cancer Neg Hx    Rectal cancer Neg Hx    Stomach cancer Neg Hx     ALLERGIES:  has no known allergies.  MEDICATIONS:  Current Outpatient Medications  Medication Sig Dispense Refill   nystatin (MYCOSTATIN/NYSTOP) powder Apply 1 Application topically 3 (three) times daily. 15 g 0   acetaminophen  (TYLENOL ) 500 MG tablet Take 1,000 mg by mouth every 6 (six)  hours as needed for mild pain.     albuterol  (PROVENTIL ) (2.5 MG/3ML) 0.083% nebulizer solution Take 3 mLs (2.5 mg total) by nebulization every 6 (six) hours as needed for wheezing or shortness of breath. 75 mL 12   amLODipine (NORVASC) 5 MG tablet Take 5 mg by mouth daily. (Patient taking differently: Take 10 mg by mouth daily.)  busPIRone (BUSPAR) 10 MG tablet Take 10 mg by mouth 3 (three) times daily as needed (Anxiety).     dexamethasone  (DECADRON ) 4 MG tablet Take 2 tabs by mouth 2 times daily starting day before chemo. Then take 2 tabs daily for 2 days starting day after chemo. Take with food. 30 tablet 1   lidocaine -prilocaine (EMLA) cream Apply to affected area once 30 g 3   omega-3 acid ethyl esters (LOVAZA) 1 g capsule Take 1 g by mouth daily.     ondansetron  (ZOFRAN ) 8 MG tablet Take 1 tablet (8 mg total) by mouth every 8 (eight) hours as needed for nausea or vomiting. Start on the third day after chemotherapy. 30 tablet 1   oxyCODONE  (OXY IR/ROXICODONE ) 5 MG immediate release tablet Take 1 tablet (5 mg total) by mouth every 6 (six) hours as needed for moderate pain (pain score 4-6) or severe pain (pain score 7-10). 15 tablet 0   prochlorperazine  (COMPAZINE ) 10 MG tablet Take 1 tablet (10 mg total) by mouth every 6 (six) hours as needed for nausea or vomiting. 30 tablet 1   rosuvastatin (CRESTOR) 10 MG tablet Take 10 mg by mouth daily.     tretinoin (RETIN-A) 0.1 % cream Apply 1 application  topically at bedtime.     No current facility-administered medications for this visit.   ECOG PERFORMANCE STATUS: 0 - Asymptomatic  Vitals:   09/03/24 1541  BP: (!) 150/74  Pulse: (!) 101  Resp: 18  Temp: 98.9 F (37.2 C)  SpO2: 97%    Filed Weights   09/03/24 1541  Weight: 219 lb 9.6 oz (99.6 kg)     GENERAL:alert, no distress and comfortable S/p bilateral reduction mammoplasty. Healing well. Superficial wound noted in the right breast No concern for infection Port placed. CTA  bilaterally RRR No LE edema.   LABORATORY DATA:  I have reviewed the data as listed Lab Results  Component Value Date   WBC 10.1 09/03/2024   HGB 13.6 09/03/2024   HCT 40.0 09/03/2024   MCV 82.6 09/03/2024   PLT 275 09/03/2024   Lab Results  Component Value Date   NA 138 09/03/2024   K 3.9 09/03/2024   CL 105 09/03/2024   CO2 24 09/03/2024    RADIOGRAPHIC STUDIES: I have personally reviewed the radiological reports and agreed with the findings in the report.  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 Oncotype DX score 46, high recurrence risk. Chemotherapy recommended to reduce metastasis risk. - Recommend TC regimen: two chemotherapy drugs every 21 days for four cycles, followed by radiation and anti-estrogen therapy for five years. - She has her port placed - Bilateral reduction mammoplasty wound appear satisfactory - baseline CBC reviewed, satisfactory to proceed - No concern on exam - FU with us  before c2 of TC or sooner as needed.      Time spent: 30 min including H and P, review of records, counseling and coordination of care. All questions were answered. The patient knows to call the clinic with any problems, questions or concerns.    Amber Stalls, MD 09/03/24

## 2024-09-03 NOTE — Assessment & Plan Note (Signed)
 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 Oncotype DX score 46, high recurrence risk. Chemotherapy recommended to reduce metastasis risk. - Recommend TC regimen: two chemotherapy drugs every 21 days for four cycles, followed by radiation and anti-estrogen therapy for five years. - She has her port placed - Bilateral reduction mammoplasty wound appear satisfactory - baseline CBC reviewed, satisfactory to proceed - No concern on exam - FU with us  before c2 of TC or sooner as needed.

## 2024-09-04 ENCOUNTER — Other Ambulatory Visit: Payer: Self-pay

## 2024-09-04 ENCOUNTER — Inpatient Hospital Stay

## 2024-09-04 VITALS — BP 127/83 | HR 80 | Temp 98.0°F | Resp 16

## 2024-09-04 DIAGNOSIS — R432 Parageusia: Secondary | ICD-10-CM | POA: Diagnosis not present

## 2024-09-04 DIAGNOSIS — C50212 Malignant neoplasm of upper-inner quadrant of left female breast: Secondary | ICD-10-CM | POA: Diagnosis not present

## 2024-09-04 DIAGNOSIS — Z803 Family history of malignant neoplasm of breast: Secondary | ICD-10-CM | POA: Diagnosis not present

## 2024-09-04 DIAGNOSIS — R21 Rash and other nonspecific skin eruption: Secondary | ICD-10-CM | POA: Diagnosis not present

## 2024-09-04 DIAGNOSIS — Z5111 Encounter for antineoplastic chemotherapy: Secondary | ICD-10-CM | POA: Diagnosis not present

## 2024-09-04 DIAGNOSIS — R609 Edema, unspecified: Secondary | ICD-10-CM | POA: Diagnosis not present

## 2024-09-04 DIAGNOSIS — Z806 Family history of leukemia: Secondary | ICD-10-CM | POA: Diagnosis not present

## 2024-09-04 DIAGNOSIS — Z1721 Progesterone receptor positive status: Secondary | ICD-10-CM | POA: Diagnosis not present

## 2024-09-04 DIAGNOSIS — Z5189 Encounter for other specified aftercare: Secondary | ICD-10-CM | POA: Diagnosis not present

## 2024-09-04 DIAGNOSIS — Z90710 Acquired absence of both cervix and uterus: Secondary | ICD-10-CM | POA: Diagnosis not present

## 2024-09-04 DIAGNOSIS — Z1732 Human epidermal growth factor receptor 2 negative status: Secondary | ICD-10-CM | POA: Diagnosis not present

## 2024-09-04 DIAGNOSIS — R110 Nausea: Secondary | ICD-10-CM | POA: Diagnosis not present

## 2024-09-04 DIAGNOSIS — E860 Dehydration: Secondary | ICD-10-CM | POA: Diagnosis not present

## 2024-09-04 DIAGNOSIS — Z170 Estrogen receptor positive status [ER+]: Secondary | ICD-10-CM | POA: Diagnosis not present

## 2024-09-04 MED ORDER — SODIUM CHLORIDE 0.9 % IV SOLN
75.0000 mg/m2 | Freq: Once | INTRAVENOUS | Status: AC
  Administered 2024-09-04: 160 mg via INTRAVENOUS
  Filled 2024-09-04: qty 16

## 2024-09-04 MED ORDER — SODIUM CHLORIDE 0.9 % IV SOLN: INTRAVENOUS | Status: DC

## 2024-09-04 MED ORDER — SODIUM CHLORIDE 0.9 % IV SOLN
600.0000 mg/m2 | Freq: Once | INTRAVENOUS | Status: AC
  Administered 2024-09-04: 1280 mg via INTRAVENOUS
  Filled 2024-09-04: qty 64

## 2024-09-04 MED ORDER — DEXAMETHASONE SOD PHOSPHATE PF 10 MG/ML IJ SOLN
10.0000 mg | Freq: Once | INTRAMUSCULAR | Status: AC
  Administered 2024-09-04: 10 mg via INTRAVENOUS

## 2024-09-04 MED ORDER — PALONOSETRON HCL INJECTION 0.25 MG/5ML
0.2500 mg | Freq: Once | INTRAVENOUS | Status: AC
  Administered 2024-09-04: 0.25 mg via INTRAVENOUS
  Filled 2024-09-04: qty 5

## 2024-09-04 NOTE — Patient Instructions (Signed)
 [facility] - A DEPT OF [facility]  Discharge Instructions: Thank you for choosing [facility] to provide your oncology and hematology care.   If you have a lab appointment with the Cancer Center, please go directly to the Cancer Center and check in at the registration area.   Wear comfortable clothing and clothing appropriate for easy access to any Portacath or PICC line.   We strive to give you quality time with your provider. You may need to reschedule your appointment if you arrive late (15 or more minutes).  Arriving late affects you and other patients whose appointments are after yours.  Also, if you miss three or more appointments without notifying the office, you may be dismissed from the clinic at the provider's discretion.      For prescription refill requests, have your pharmacy contact our office and allow 72 hours for refills to be completed.    Today you received the following chemotherapy and/or immunotherapy agents: Docetaxel, Cytoxan.       To help prevent nausea and vomiting after your treatment, we encourage you to take your nausea medication as directed.  BELOW ARE SYMPTOMS THAT SHOULD BE REPORTED IMMEDIATELY: *FEVER GREATER THAN 100.4 F (38 C) OR HIGHER *CHILLS OR SWEATING *NAUSEA AND VOMITING THAT IS NOT CONTROLLED WITH YOUR NAUSEA MEDICATION *UNUSUAL SHORTNESS OF BREATH *UNUSUAL BRUISING OR BLEEDING *URINARY PROBLEMS (pain or burning when urinating, or frequent urination) *BOWEL PROBLEMS (unusual diarrhea, constipation, pain near the anus) TENDERNESS IN MOUTH AND THROAT WITH OR WITHOUT PRESENCE OF ULCERS (sore throat, sores in mouth, or a toothache) UNUSUAL RASH, SWELLING OR PAIN  UNUSUAL VAGINAL DISCHARGE OR ITCHING   Items with * indicate a potential emergency and should be followed up as soon as possible or go to the Emergency Department if any problems should occur.  Please show the CHEMOTHERAPY ALERT CARD or  IMMUNOTHERAPY ALERT CARD at check-in to the Emergency Department and triage nurse.  Should you have questions after your visit or need to cancel or reschedule your appointment, please contact [facility] - A DEPT OF [facility]  Dept: [phone]  and follow the prompts.  Office hours are 8:00 a.m. to 4:30 p.m. Monday - Friday. Please note that voicemails left after 4:00 p.m. may not be returned until the following business day.  We are closed weekends and major holidays. You have access to a nurse at all times for urgent questions. Please call the main number to the clinic Dept: [phone] and follow the prompts.   For any non-urgent questions, you may also contact your provider using MyChart. We now offer e-Visits for anyone 72 and older to request care online for non-urgent symptoms. For details visit [email or web address].   Also download the MyChart app! Go to the app store, search "MyChart", open the app, select [facility], and log in with your MyChart username and password.  Docetaxel Injection What is this medication? DOCETAXEL (doe se TAX el) treats some types of cancer. It works by slowing down the growth of cancer cells. This medicine may be used for other purposes; ask your health care provider or pharmacist if you have questions. COMMON BRAND NAME(S): BEIZRAY, Docefrez, Docivyx, Taxotere What should I tell my care team before I take this medication? They need to know if you have any of these conditions: Kidney disease Liver disease Low white blood cell levels Tingling of the fingers or toes  or other nerve disorder An unusual or allergic reaction to docetaxel, polysorbate 80, other medications, foods, dyes, or preservatives Pregnant or trying to get pregnant Breast-feeding How should I use this medication? This medication is injected into a vein. It is given by your care team in a hospital or clinic setting. Talk to your care team about the  use of this medication in children. Special care may be needed. Overdosage: If you think you have taken too much of this medicine contact a poison control center or emergency room at once. NOTE: This medicine is only for you. Do not share this medicine with others. What if I miss a dose? Keep appointments for follow-up doses. It is important not to miss your dose. Call your care team if you are unable to keep an appointment. What may interact with this medication? Do not take this medication with any of the following: Live virus vaccines This medication may also interact with the following: Certain antibiotics, such as clarithromycin, telithromycin Certain antivirals for HIV or hepatitis Certain medications for fungal infections, such as itraconazole, ketoconazole, voriconazole Grapefruit juice Nefazodone Supplements, such as St. John's wort This list may not describe all possible interactions. Give your health care provider a list of all the medicines, herbs, non-prescription drugs, or dietary supplements you use. Also tell them if you smoke, drink alcohol, or use illegal drugs. Some items may interact with your medicine. What should I watch for while using this medication? This medication may make you feel generally unwell. This is not uncommon as chemotherapy can affect healthy cells as well as cancer cells. Report any side effects. Continue your course of treatment even though you feel ill unless your care team tells you to stop. You may need blood work done while you are taking this medication. This medication can cause serious side effects and infusion reactions. To reduce the risk, your care team may give you other medications to take before receiving this one. Be sure to follow the directions from your care team. This medication may increase your risk of getting an infection. Call your care team for advice if you get a fever, chills, sore throat, or other symptoms of a cold or flu. Do not  treat yourself. Try to avoid being around people who are sick. Avoid taking medications that contain aspirin, acetaminophen, ibuprofen, naproxen, or ketoprofen unless instructed by your care team. These medications may hide a fever. Be careful brushing or flossing your teeth or using a toothpick because you may get an infection or bleed more easily. If you have any dental work done, tell your dentist you are receiving this medication. Some products may contain alcohol. Ask your care team if this medication contains alcohol. Be sure to tell all care teams you are taking this medicine. Certain medications, like metronidazole and disulfiram, can cause an unpleasant reaction when taken with alcohol. The reaction includes flushing, headache, nausea, vomiting, sweating, and increased thirst. The reaction can last from 30 minutes to several hours. This medication may affect your coordination, reaction time, or judgement. Do not drive or operate machinery until you know how this medication affects you. Sit up or stand slowly to reduce the risk of dizzy or fainting spells. Drinking alcohol with this medication can increase the risk of these side effects. Talk to your care team about your risk of cancer. You may be more at risk for certain types of cancer if you take this medication. Talk to your care team if you wish to become  pregnant or think you might be pregnant. This medication can cause serious birth defects if taken during pregnancy or if you get pregnant within 2 months after stopping therapy. A negative pregnancy test is required before starting this medication. A reliable form of contraception is recommended while taking this medication and for 2 months after stopping it. Talk to your care team about reliable forms of contraception. Do not breast-feed while taking this medication and for 1 week after stopping therapy. Use a condom during sex and for 4 months after stopping therapy. Tell your care team right  away if you think your partner might be pregnant. This medication can cause serious birth defects. This medication may cause infertility. Talk to your care team if you are concerned about your fertility. What side effects may I notice from receiving this medication? Side effects that you should report to your care team as soon as possible: Allergic reactions--skin rash, itching, hives, swelling of the face, lips, tongue, or throat Change in vision such as blurry vision, seeing halos around lights, vision loss Infection--fever, chills, cough, or sore throat Infusion reactions--chest pain, shortness of breath or trouble breathing, feeling faint or lightheaded Low red blood cell level--unusual weakness or fatigue, dizziness, headache, trouble breathing Pain, tingling, or numbness in the hands or feet Painful swelling, warmth, or redness of the skin, blisters or sores at the infusion site Redness, blistering, peeling, or loosening of the skin, including inside the mouth Sudden or severe stomach pain, bloody diarrhea, fever, nausea, vomiting Swelling of the ankles, hands, or feet Tumor lysis syndrome (TLS)--nausea, vomiting, diarrhea, decrease in the amount of urine, dark urine, unusual weakness or fatigue, confusion, muscle pain or cramps, fast or irregular heartbeat, joint pain Unusual bruising or bleeding Side effects that usually do not require medical attention (report to your care team if they continue or are bothersome): Change in nail shape, thickness, or color Change in taste Hair loss Increased tears This list may not describe all possible side effects. Call your doctor for medical advice about side effects. You may report side effects to FDA at 1-800-FDA-1088. Where should I keep my medication? This medication is given in a hospital or clinic. It will not be stored at home. NOTE: This sheet is a summary. It may not cover all possible information. If you have questions about this  medicine, talk to your doctor, pharmacist, or health care provider.  2024 Elsevier/Gold Standard (2021-12-22 00:00:00) Cyclophosphamide Injection What is this medication? CYCLOPHOSPHAMIDE (sye kloe FOSS fa mide) treats some types of cancer. It works by slowing down the growth of cancer cells. This medicine may be used for other purposes; ask your health care provider or pharmacist if you have questions. COMMON BRAND NAME(S): Cyclophosphamide, Cytoxan, Neosar What should I tell my care team before I take this medication? They need to know if you have any of these conditions: Heart disease Irregular heartbeat or rhythm Infection Kidney problems Liver disease Low blood cell levels (white cells, platelets, or red blood cells) Lung disease Previous radiation Trouble passing urine An unusual or allergic reaction to cyclophosphamide, other medications, foods, dyes, or preservatives Pregnant or trying to get pregnant Breast-feeding How should I use this medication? This medication is injected into a vein. It is given by your care team in a hospital or clinic setting. Talk to your care team about the use of this medication in children. Special care may be needed. Overdosage: If you think you have taken too much of this medicine contact a  poison control center or emergency room at once. NOTE: This medicine is only for you. Do not share this medicine with others. What if I miss a dose? Keep appointments for follow-up doses. It is important not to miss your dose. Call your care team if you are unable to keep an appointment. What may interact with this medication? Amphotericin B Amiodarone Azathioprine Certain antivirals for HIV or hepatitis Certain medications for blood pressure, such as enalapril, lisinopril, quinapril Cyclosporine Diuretics Etanercept Indomethacin Medications that relax muscles Metronidazole Natalizumab Tamoxifen Warfarin This list may not describe all possible  interactions. Give your health care provider a list of all the medicines, herbs, non-prescription drugs, or dietary supplements you use. Also tell them if you smoke, drink alcohol, or use illegal drugs. Some items may interact with your medicine. What should I watch for while using this medication? This medication may make you feel generally unwell. This is not uncommon as chemotherapy can affect healthy cells as well as cancer cells. Report any side effects. Continue your course of treatment even though you feel ill unless your care team tells you to stop. You may need blood work while you are taking this medication. This medication may increase your risk of getting an infection. Call your care team for advice if you get a fever, chills, sore throat, or other symptoms of a cold or flu. Do not treat yourself. Try to avoid being around people who are sick. Avoid taking medications that contain aspirin, acetaminophen, ibuprofen, naproxen, or ketoprofen unless instructed by your care team. These medications may hide a fever. Be careful brushing or flossing your teeth or using a toothpick because you may get an infection or bleed more easily. If you have any dental work done, tell your dentist you are receiving this medication. Drink water or other fluids as directed. Urinate often, even at night. Some products may contain alcohol. Ask your care team if this medication contains alcohol. Be sure to tell all care teams you are taking this medicine. Certain medicines, like metronidazole and disulfiram, can cause an unpleasant reaction when taken with alcohol. The reaction includes flushing, headache, nausea, vomiting, sweating, and increased thirst. The reaction can last from 30 minutes to several hours. Talk to your care team if you wish to become pregnant or think you might be pregnant. This medication can cause serious birth defects if taken during pregnancy and for 1 year after the last dose. A negative  pregnancy test is required before starting this medication. A reliable form of contraception is recommended while taking this medication and for 1 year after the last dose. Talk to your care team about reliable forms of contraception. Do not father a child while taking this medication and for 4 months after the last dose. Use a condom during this time period. Do not breast-feed while taking this medication or for 1 week after the last dose. This medication may cause infertility. Talk to your care team if you are concerned about your fertility. Talk to your care team about your risk of cancer. You may be more at risk for certain types of cancer if you take this medication. What side effects may I notice from receiving this medication? Side effects that you should report to your care team as soon as possible: Allergic reactions--skin rash, itching, hives, swelling of the face, lips, tongue, or throat Dry cough, shortness of breath or trouble breathing Heart failure--shortness of breath, swelling of the ankles, feet, or hands, sudden weight gain, unusual weakness  or fatigue Heart muscle inflammation--unusual weakness or fatigue, shortness of breath, chest pain, fast or irregular heartbeat, dizziness, swelling of the ankles, feet, or hands Heart rhythm changes--fast or irregular heartbeat, dizziness, feeling faint or lightheaded, chest pain, trouble breathing Infection--fever, chills, cough, sore throat, wounds that don't heal, pain or trouble when passing urine, general feeling of discomfort or being unwell Kidney injury--decrease in the amount of urine, swelling of the ankles, hands, or feet Liver injury--right upper belly pain, loss of appetite, nausea, light-colored stool, dark yellow or brown urine, yellowing skin or eyes, unusual weakness or fatigue Low red blood cell level--unusual weakness or fatigue, dizziness, headache, trouble breathing Low sodium level--muscle weakness, fatigue, dizziness,  headache, confusion Red or dark brown urine Unusual bruising or bleeding Side effects that usually do not require medical attention (report to your care team if they continue or are bothersome): Hair loss Irregular menstrual cycles or spotting Loss of appetite Nausea Pain, redness, or swelling with sores inside the mouth or throat Vomiting This list may not describe all possible side effects. Call your doctor for medical advice about side effects. You may report side effects to FDA at 1-800-FDA-1088. Where should I keep my medication? This medication is given in a hospital or clinic. It will not be stored at home. NOTE: This sheet is a summary. It may not cover all possible information. If you have questions about this medicine, talk to your doctor, pharmacist, or health care provider.  2024 Elsevier/Gold Standard (2022-03-03 00:00:00)

## 2024-09-05 ENCOUNTER — Ambulatory Visit: Payer: Self-pay | Admitting: *Deleted

## 2024-09-06 ENCOUNTER — Inpatient Hospital Stay

## 2024-09-06 VITALS — BP 143/78 | HR 99 | Temp 98.2°F | Resp 16

## 2024-09-06 DIAGNOSIS — Z170 Estrogen receptor positive status [ER+]: Secondary | ICD-10-CM

## 2024-09-06 DIAGNOSIS — Z803 Family history of malignant neoplasm of breast: Secondary | ICD-10-CM | POA: Diagnosis not present

## 2024-09-06 DIAGNOSIS — R432 Parageusia: Secondary | ICD-10-CM | POA: Diagnosis not present

## 2024-09-06 DIAGNOSIS — R21 Rash and other nonspecific skin eruption: Secondary | ICD-10-CM | POA: Diagnosis not present

## 2024-09-06 DIAGNOSIS — Z5111 Encounter for antineoplastic chemotherapy: Secondary | ICD-10-CM | POA: Diagnosis not present

## 2024-09-06 DIAGNOSIS — Z1732 Human epidermal growth factor receptor 2 negative status: Secondary | ICD-10-CM | POA: Diagnosis not present

## 2024-09-06 DIAGNOSIS — C50212 Malignant neoplasm of upper-inner quadrant of left female breast: Secondary | ICD-10-CM | POA: Diagnosis not present

## 2024-09-06 DIAGNOSIS — R609 Edema, unspecified: Secondary | ICD-10-CM | POA: Diagnosis not present

## 2024-09-06 DIAGNOSIS — Z90710 Acquired absence of both cervix and uterus: Secondary | ICD-10-CM | POA: Diagnosis not present

## 2024-09-06 DIAGNOSIS — Z806 Family history of leukemia: Secondary | ICD-10-CM | POA: Diagnosis not present

## 2024-09-06 DIAGNOSIS — Z1721 Progesterone receptor positive status: Secondary | ICD-10-CM | POA: Diagnosis not present

## 2024-09-06 DIAGNOSIS — R110 Nausea: Secondary | ICD-10-CM | POA: Diagnosis not present

## 2024-09-06 DIAGNOSIS — E860 Dehydration: Secondary | ICD-10-CM | POA: Diagnosis not present

## 2024-09-06 DIAGNOSIS — Z5189 Encounter for other specified aftercare: Secondary | ICD-10-CM | POA: Diagnosis not present

## 2024-09-06 MED ORDER — PEGFILGRASTIM-JMDB 6 MG/0.6ML ~~LOC~~ SOSY
6.0000 mg | PREFILLED_SYRINGE | Freq: Once | SUBCUTANEOUS | Status: AC
  Administered 2024-09-06: 6 mg via SUBCUTANEOUS
  Filled 2024-09-06: qty 0.6

## 2024-09-08 ENCOUNTER — Inpatient Hospital Stay: Admitting: Physician Assistant

## 2024-09-08 ENCOUNTER — Encounter: Payer: Self-pay | Admitting: Hematology and Oncology

## 2024-09-08 ENCOUNTER — Telehealth: Payer: Self-pay

## 2024-09-08 ENCOUNTER — Ambulatory Visit (HOSPITAL_BASED_OUTPATIENT_CLINIC_OR_DEPARTMENT_OTHER)
Admission: RE | Admit: 2024-09-08 | Discharge: 2024-09-08 | Disposition: A | Discharge status: Home / Self Care | Source: Ambulatory Visit | Attending: Surgery | Admitting: Surgery

## 2024-09-08 VITALS — BP 117/65 | HR 82 | Temp 98.2°F | Resp 15 | Wt 219.0 lb

## 2024-09-08 DIAGNOSIS — M47815 Spondylosis without myelopathy or radiculopathy, thoracolumbar region: Secondary | ICD-10-CM | POA: Diagnosis not present

## 2024-09-08 DIAGNOSIS — R0600 Dyspnea, unspecified: Secondary | ICD-10-CM | POA: Diagnosis not present

## 2024-09-08 DIAGNOSIS — Z170 Estrogen receptor positive status [ER+]: Secondary | ICD-10-CM

## 2024-09-08 DIAGNOSIS — K5730 Diverticulosis of large intestine without perforation or abscess without bleeding: Secondary | ICD-10-CM | POA: Diagnosis not present

## 2024-09-08 DIAGNOSIS — R918 Other nonspecific abnormal finding of lung field: Secondary | ICD-10-CM | POA: Diagnosis not present

## 2024-09-08 DIAGNOSIS — Z95828 Presence of other vascular implants and grafts: Secondary | ICD-10-CM | POA: Insufficient documentation

## 2024-09-08 DIAGNOSIS — A419 Sepsis, unspecified organism: Secondary | ICD-10-CM | POA: Diagnosis not present

## 2024-09-08 DIAGNOSIS — I340 Nonrheumatic mitral (valve) insufficiency: Secondary | ICD-10-CM | POA: Diagnosis not present

## 2024-09-08 DIAGNOSIS — B9561 Methicillin susceptible Staphylococcus aureus infection as the cause of diseases classified elsewhere: Secondary | ICD-10-CM | POA: Diagnosis not present

## 2024-09-08 DIAGNOSIS — E66812 Obesity, class 2: Secondary | ICD-10-CM | POA: Diagnosis present

## 2024-09-08 DIAGNOSIS — I361 Nonrheumatic tricuspid (valve) insufficiency: Secondary | ICD-10-CM | POA: Diagnosis not present

## 2024-09-08 DIAGNOSIS — L03313 Cellulitis of chest wall: Secondary | ICD-10-CM | POA: Diagnosis present

## 2024-09-08 DIAGNOSIS — I071 Rheumatic tricuspid insufficiency: Secondary | ICD-10-CM | POA: Diagnosis present

## 2024-09-08 DIAGNOSIS — M549 Dorsalgia, unspecified: Secondary | ICD-10-CM | POA: Diagnosis not present

## 2024-09-08 DIAGNOSIS — A4101 Sepsis due to Methicillin susceptible Staphylococcus aureus: Secondary | ICD-10-CM | POA: Diagnosis present

## 2024-09-08 DIAGNOSIS — E8770 Fluid overload, unspecified: Secondary | ICD-10-CM | POA: Diagnosis not present

## 2024-09-08 DIAGNOSIS — Z6836 Body mass index (BMI) 36.0-36.9, adult: Secondary | ICD-10-CM | POA: Diagnosis not present

## 2024-09-08 DIAGNOSIS — Z803 Family history of malignant neoplasm of breast: Secondary | ICD-10-CM | POA: Diagnosis not present

## 2024-09-08 DIAGNOSIS — R7881 Bacteremia: Secondary | ICD-10-CM | POA: Diagnosis not present

## 2024-09-08 DIAGNOSIS — T80212A Local infection due to central venous catheter, initial encounter: Secondary | ICD-10-CM | POA: Diagnosis not present

## 2024-09-08 DIAGNOSIS — E875 Hyperkalemia: Secondary | ICD-10-CM | POA: Diagnosis present

## 2024-09-08 DIAGNOSIS — R059 Cough, unspecified: Secondary | ICD-10-CM | POA: Diagnosis not present

## 2024-09-08 DIAGNOSIS — C50212 Malignant neoplasm of upper-inner quadrant of left female breast: Secondary | ICD-10-CM

## 2024-09-08 DIAGNOSIS — T80211A Bloodstream infection due to central venous catheter, initial encounter: Secondary | ICD-10-CM | POA: Diagnosis present

## 2024-09-08 DIAGNOSIS — I10 Essential (primary) hypertension: Secondary | ICD-10-CM | POA: Diagnosis present

## 2024-09-08 DIAGNOSIS — M47816 Spondylosis without myelopathy or radiculopathy, lumbar region: Secondary | ICD-10-CM | POA: Diagnosis not present

## 2024-09-08 DIAGNOSIS — D6959 Other secondary thrombocytopenia: Secondary | ICD-10-CM | POA: Diagnosis present

## 2024-09-08 DIAGNOSIS — Z1152 Encounter for screening for COVID-19: Secondary | ICD-10-CM | POA: Diagnosis not present

## 2024-09-08 DIAGNOSIS — Z806 Family history of leukemia: Secondary | ICD-10-CM | POA: Diagnosis not present

## 2024-09-08 DIAGNOSIS — Y712 Prosthetic and other implants, materials and accessory cardiovascular devices associated with adverse incidents: Secondary | ICD-10-CM | POA: Diagnosis present

## 2024-09-08 DIAGNOSIS — I509 Heart failure, unspecified: Secondary | ICD-10-CM | POA: Diagnosis not present

## 2024-09-08 DIAGNOSIS — R000 Tachycardia, unspecified: Secondary | ICD-10-CM | POA: Diagnosis not present

## 2024-09-08 DIAGNOSIS — M47817 Spondylosis without myelopathy or radiculopathy, lumbosacral region: Secondary | ICD-10-CM | POA: Diagnosis not present

## 2024-09-08 DIAGNOSIS — N281 Cyst of kidney, acquired: Secondary | ICD-10-CM | POA: Diagnosis not present

## 2024-09-08 DIAGNOSIS — E785 Hyperlipidemia, unspecified: Secondary | ICD-10-CM | POA: Diagnosis present

## 2024-09-08 DIAGNOSIS — F419 Anxiety disorder, unspecified: Secondary | ICD-10-CM | POA: Diagnosis present

## 2024-09-08 DIAGNOSIS — R7989 Other specified abnormal findings of blood chemistry: Secondary | ICD-10-CM | POA: Diagnosis not present

## 2024-09-08 DIAGNOSIS — D696 Thrombocytopenia, unspecified: Secondary | ICD-10-CM | POA: Diagnosis not present

## 2024-09-08 DIAGNOSIS — J90 Pleural effusion, not elsewhere classified: Secondary | ICD-10-CM | POA: Diagnosis not present

## 2024-09-08 DIAGNOSIS — J4520 Mild intermittent asthma, uncomplicated: Secondary | ICD-10-CM | POA: Diagnosis present

## 2024-09-08 DIAGNOSIS — E876 Hypokalemia: Secondary | ICD-10-CM | POA: Diagnosis present

## 2024-09-08 DIAGNOSIS — R911 Solitary pulmonary nodule: Secondary | ICD-10-CM | POA: Diagnosis not present

## 2024-09-08 DIAGNOSIS — R0989 Other specified symptoms and signs involving the circulatory and respiratory systems: Secondary | ICD-10-CM | POA: Diagnosis not present

## 2024-09-08 DIAGNOSIS — J811 Chronic pulmonary edema: Secondary | ICD-10-CM | POA: Diagnosis not present

## 2024-09-08 DIAGNOSIS — R7401 Elevation of levels of liver transaminase levels: Secondary | ICD-10-CM | POA: Diagnosis not present

## 2024-09-08 DIAGNOSIS — Z8616 Personal history of COVID-19: Secondary | ICD-10-CM | POA: Diagnosis not present

## 2024-09-08 DIAGNOSIS — M545 Low back pain, unspecified: Secondary | ICD-10-CM | POA: Diagnosis not present

## 2024-09-08 DIAGNOSIS — I088 Other rheumatic multiple valve diseases: Secondary | ICD-10-CM | POA: Diagnosis not present

## 2024-09-08 DIAGNOSIS — D849 Immunodeficiency, unspecified: Secondary | ICD-10-CM | POA: Diagnosis present

## 2024-09-08 DIAGNOSIS — T827XXA Infection and inflammatory reaction due to other cardiac and vascular devices, implants and grafts, initial encounter: Secondary | ICD-10-CM | POA: Diagnosis not present

## 2024-09-08 DIAGNOSIS — E871 Hypo-osmolality and hyponatremia: Secondary | ICD-10-CM | POA: Diagnosis present

## 2024-09-08 DIAGNOSIS — Z79899 Other long term (current) drug therapy: Secondary | ICD-10-CM | POA: Diagnosis not present

## 2024-09-08 DIAGNOSIS — Z7951 Long term (current) use of inhaled steroids: Secondary | ICD-10-CM | POA: Diagnosis not present

## 2024-09-08 MED ORDER — OXYCODONE HCL 5 MG PO TABS: 5.0000 mg | ORAL_TABLET | Freq: Four times a day (QID) | ORAL | 0 refills | Status: AC | PRN

## 2024-09-08 NOTE — Patient Instructions (Signed)
 Please call to schedule your ultrasound. If you have any issues scheduling it let Dr. Walter nurse know so we can help.

## 2024-09-08 NOTE — Progress Notes (Signed)
 Symptom Management Consult Note [facility]    Patient Care Team: Cristopher Suzen HERO, NP as PCP - General Cristopher Suzen HERO, NP Tyree Nanetta SAILOR, RN as Oncology Nurse Navigator Gerome, Devere HERO, RN as Oncology Nurse Navigator Vernetta Berg, MD as Consulting Physician (General Surgery) Loretha Ash, MD as Consulting Physician (Hematology and Oncology) Izell Domino, MD as Attending Physician (Radiation Oncology)    Name / MRN / DOB: Mary Mendez  [id number]  [birth year]   Date of visit: 09/08/2024   Chief Complaint/Reason for visit: port-a-cath evaluation   Current Therapy: Taxotere and cytoxan with fulphila  Last treatment:  Day 1   Cycle 1 on 09/04/24 with G-CSF on 09/06/24    ASSESSMENT AND PLAN Patient is a 64 y.o. female with oncologic history of malignant neoplasm of upper-inner quadrant of left breast in female, estrogen receptor positivefollowed by Dr. Loretha.  I have viewed most recent oncology note and lab work.  #Malignant neoplasm of upper-inner quadrant of left breast in female, estrogen receptor positive  - Recently had first treatment 09/03/24. Patient with severe leg plan after G-CSF injection not managed with tylenol . Pain causing sleep disruptions. Will prescribe oxycodone  for PRN use for severe pain only. Patient knows to take tylenol  for mild to moderate pain. I have reviewed the PDMP during this encounter. - Next appointment with oncologist is 09/24/24  #Port-a-cath - Port inserted 08/18/24 by Dr. Vernetta with proper placement checked same day. - New edema on right upper chest above port extending to neck. Will order STAT DVT study to rule out a blood clot.  PE not suggestive of infection. Will follow up on DVT results. If ultrasound is negative, continue with ice and Tylenol  for discomfort.    Strict ED precautions discussed should symptoms worsen.   HEME/ONC HISTORY Oncology History  Malignant neoplasm of upper-inner  quadrant of left breast in female, estrogen receptor positive (HCC)  06/03/2024 Pathology Results   Breast, left, needle core biopsy, mass with calcifications 5mm upper inner quadrant (ribbon clip) :      INVASIVE MAMMARY CARCINOMA      TUBULE FORMATION: SCORE 3      NUCLEAR PLEOMORPHISM: SCORE 2      MITOTIC COUNT: SCORE 1      TOTAL SCORE: 6      OVERALL GRADE: 2      LYMPHOVASCULAR INVASION: NOT IDENTIFIED      CANCER LENGTH: 5.5 MM      CALCIFICATIONS: PRESENT      OTHER FINDINGS: NONE      SEE NOTE       2. Breast, left, needle core biopsy, 2.5cm group upper inner quadrant (x clip) :      DUCTAL CARCINOMA IN SITU, INTERMEDIATE GRADE, SOLID TYPE      NECROSIS: PRESENT      CALCIFICATIONS: PRESENT      DCIS LENGTH: 1.0 CM / 10 MM      SEE NOTE  IMMUNOHISTOCHEMICAL AND MORPHOMETRIC ANALYSIS PERFORMED MANUALLY The tumor cells are NEGATIVE for Her2 (0). Estrogen Receptor:  100%, POSITIVE, STRONG STAINING INTENSITY Progesterone Receptor:  20%, POSITIVE, MODERATE STAINING INTENSITY Proliferation Marker Ki67:  60%     06/04/2024 Mammogram   Suspicious findings. The suggested screening mammographic asymmetry is consistent with a small irregular mass/focal asymmetry with a few associated calcifications projecting in the upper inner left breast measuring 5 mm mammographically. This may correspond to the hypoechoic mass seen on sonography at 10 o'clock, 8 cm the  nipple or to the second sonographic abnormality, at 10:30 o'clock, 10 cm from the nipple. This latter finding is smaller and more subtle. Given the uncertainty of which sonographic finding may be the correlate to the mammographic abnormality, stereotactic biopsy of the mammographic finding is recommended.  2.5 cm group of suspicious calcifications in the medial left breast, anterior to the above described small irregular mass/focal asymmetry. Stereotactic core needle biopsy is recommended.   06/09/2024 Initial Diagnosis    Malignant neoplasm of upper-inner quadrant of left breast in female, estrogen receptor positive (HCC)   06/11/2024 Cancer Staging   Staging form: Breast, AJCC 8th Edition - Clinical stage from 06/11/2024: Stage IA (cT1b, cN0, cM0, G2, ER+, PR+, HER2-) - Signed by Loretha Ash, MD on 06/11/2024 Stage prefix: Initial diagnosis Histologic grading system: 3 grade system Laterality: Left Staged by: Pathologist and managing physician Stage used in treatment planning: Yes National guidelines used in treatment planning: Yes Type of national guideline used in treatment planning: NCCN    Genetic Testing   [facility] CancerNext-Expanded Panel+RNA was Negative. 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 Report date is 06/18/2024.   The CancerNext-Expanded gene panel offered by [facility] and includes sequencing, rearrangement, and RNA analysis for the following 77 genes: AIP, ALK, APC, ATM, AXIN2, BAP1, BARD1, BMPR1A, BRCA1, BRCA2, BRIP1, CDC73, CDH1, CDK4, CDKN1B, CDKN2A, CEBPA, CHEK2, CTNNA1, DDX41, DICER1, ETV6, FH, FLCN, GATA2, LZTR1, MAX, MBD4, MEN1, MET, MLH1, MSH2, MSH3, MSH6, MUTYH, NF1, NF2, NTHL1, PALB2, PHOX2B, PMS2, POT1, PRKAR1A, PTCH1, PTEN, RAD51C, RAD51D, RB1, RET, RPS20, RUNX1, SDHA, SDHAF2, SDHB, SDHC, SDHD, SMAD4, SMARCA4, SMARCB1, SMARCE1, STK11, SUFU, TMEM127, TP53, TSC1, TSC2, VHL, and WT1 (sequencing and deletion/duplication); EGFR, HOXB13, KIT, MITF, PDGFRA, POLD1, and POLE (sequencing only); EPCAM and GREM1 (deletion/duplication only).     09/04/2024 -  Chemotherapy   Patient is on Treatment Plan : BREAST TC q21d         INTERVAL HISTORY  Discussed the use of AI scribe software for clinical note transcription with the patient, who gave verbal consent to proceed.    Mary Mendez is a 63 y.o. female with oncologic history as above presenting to [facility] today with chief complaint of port-a-cath evaluation. Patient presents unaccompanied  to visit today.  She has experienced discomfort and swelling at the port site, which was placed approximately three weeks ago. The discomfort has worsened over the past two nights, particularly when bending her head or sleeping, prompting her to use an ice pack for relief. The area feels warm to the touch and has become increasingly puffy since yesterday. No drainage is noted from the incision site. Denies shortness of breath. She experienced mild chest pain after taking her steroid medication this weekend that resolved spontaneously. Denies any fever or chills.  No recent injury, fall, or aggressive activity that could have impacted the port. No issues during her recent treatment.  No swelling in her right arm. Her current medications include a low dose of blood pressure medication and Tylenol  for pain. She reports significant leg pain at night, which she attributes to a recent injection, and has requested a prescription for something stronger than Tylenol , such as oxycodone , for nighttime use. Reports she discussed this as an option with oncologist at recent appointment.       ROS  All other systems are reviewed and are negative for acute change except as noted in the HPI.    No Known Allergies   Past Medical  History:  Diagnosis Date   Anxiety    Asthma    Bronchitis    Cancer (HCC) 06/2024   Left breast IDC   COVID-19 08/2021   Hyperlipidemia    Hypertension    Pneumonia      Past Surgical History:  Procedure Laterality Date   ABDOMINAL HYSTERECTOMY  2010   and left ovaries;   BREAST BIOPSY     BREAST BIOPSY Left 06/04/2024   MM LT BREAST BX W LOC DEV 1ST LESION IMAGE BX SPEC STEREO GUIDE 06/04/2024 [facility]   BREAST BIOPSY Left 06/04/2024   MM LT BREAST BX W LOC DEV EA AD LESION IMG BX SPEC STEREO GUIDE 06/04/2024 [facility]   BREAST BIOPSY  07/15/2024   MM LT RADIOACTIVE SEED LOC MAMMO GUIDE 07/15/2024 [facility]   BREAST BIOPSY  07/15/2024   MM LT  RADIOACTIVE SEED EA ADD LESION LOC MAMMO GUIDE 07/15/2024 [facility]   BREAST BIOPSY  07/15/2024   MM LT RADIOACTIVE SEED EA ADD LESION LOC MAMMO GUIDE 07/15/2024 [facility]   BREAST BIOPSY  07/15/2024   MM LT RADIOACTIVE SEED EA ADD LESION LOC MAMMO GUIDE 07/15/2024 [facility]   BREAST EXCISIONAL BIOPSY Left    BREAST LUMPECTOMY WITH RADIOACTIVE SEED AND SENTINEL LYMPH NODE BIOPSY Left 07/17/2024   Procedure: BREAST LUMPECTOMY WITH RADIOACTIVE SEED AND SENTINEL LYMPH NODE BIOPSY;  Surgeon: Vernetta Berg, MD;  Location: [facility] OR;  Service: General;  Laterality: Left;  RADIOACTIVE SEED GUIDED LEFT BREAST BRACKETED LUMPECTOMY AND SENTINEL NODE BIOPSY   BREAST REDUCTION SURGERY Bilateral 07/25/2024   Procedure: MAMMOPLASTY, REDUCTION;  Surgeon: Arelia Filippo, MD;  Location: [facility];  Service: Plastics;  Laterality: Bilateral;   BREAST SURGERY     CHOLECYSTECTOMY N/A 02/18/2022   Procedure: LAPAROSCOPIC CHOLECYSTECTOMY;  Surgeon: Signe Mitzie LABOR, MD;  Location: [facility] OR;  Service: General;  Laterality: N/A;   EXCISION, MASS, UPPER EXTREMITY Left 07/17/2024   Procedure: EXCISION, MASS, UPPER EXTREMITY;  Surgeon: Vernetta Berg, MD;  Location: [facility] OR;  Service: General;  Laterality: Left;  EXCISION LEFT SHOULDER MASS   HYSTEROTOMY     PORTACATH PLACEMENT N/A 08/18/2024   Procedure: INSERTION, TUNNELED CENTRAL VENOUS DEVICE, WITH PORT;  Surgeon: Vernetta Berg, MD;  Location: [facility];  Service: General;  Laterality: N/A;  PORT PLACEMENT WITH ULTRASOUND GUIDANCE    Social History   Socioeconomic History   Marital status: Married    Spouse name: Not on file   Number of children: Not on file   Years of education: Not on file   Highest education level: Not on file  Occupational History   Not on file  Tobacco Use   Smoking status: Never    Passive exposure: Never   Smokeless tobacco: Never  Vaping Use   Vaping status: Never Used   Substance and Sexual Activity   Alcohol use: Yes    Comment: social   Drug use: Never   Sexual activity: Not Currently    Birth control/protection: Surgical    Comment: hyst  Other Topics Concern   Not on file  Social History Narrative   Not on file   Social Drivers of Health   Financial Resource Strain: Not on file  Food Insecurity: No Food Insecurity (06/11/2024)   Hunger Vital Sign    Worried About Running Out of Food in the Last Year: Never true    Ran Out of Food in the Last Year: Never true  Transportation Needs: No  Transportation Needs (06/11/2024)   PRAPARE - Administrator, Civil Service (Medical): No    Lack of Transportation (Non-Medical): No  Physical Activity: Not on file  Stress: Not on file  Social Connections: Not on file  Intimate Partner Violence: Not At Risk (06/11/2024)   Humiliation, Afraid, Rape, and Kick questionnaire    Fear of Current or Ex-Partner: No    Emotionally Abused: No    Physically Abused: No    Sexually Abused: No    Family History  Problem Relation Age of Onset   Breast cancer Mother 38   Emphysema Father    Leukemia Maternal Aunt 39 - 79   Asthma Paternal Uncle    Colon cancer Neg Hx    Colon polyps Neg Hx    Esophageal cancer Neg Hx    Rectal cancer Neg Hx    Stomach cancer Neg Hx      Current Outpatient Medications:    acetaminophen  (TYLENOL ) 500 MG tablet, Take 1,000 mg by mouth every 6 (six) hours as needed for mild pain., Disp: , Rfl:    albuterol  (PROVENTIL ) (2.5 MG/3ML) 0.083% nebulizer solution, Take 3 mLs (2.5 mg total) by nebulization every 6 (six) hours as needed for wheezing or shortness of breath., Disp: 75 mL, Rfl: 12   amLODipine (NORVASC) 5 MG tablet, Take 5 mg by mouth daily. (Patient taking differently: Take 10 mg by mouth daily.), Disp: , Rfl:    busPIRone (BUSPAR) 10 MG tablet, Take 10 mg by mouth 3 (three) times daily as needed (Anxiety)., Disp: , Rfl:    dexamethasone  (DECADRON ) 4 MG tablet,  Take 2 tabs by mouth 2 times daily starting day before chemo. Then take 2 tabs daily for 2 days starting day after chemo. Take with food., Disp: 30 tablet, Rfl: 1   lidocaine -prilocaine (EMLA) cream, Apply to affected area once, Disp: 30 g, Rfl: 3   nystatin (MYCOSTATIN/NYSTOP) powder, Apply 1 Application topically 3 (three) times daily., Disp: 15 g, Rfl: 0   omega-3 acid ethyl esters (LOVAZA) 1 g capsule, Take 1 g by mouth daily., Disp: , Rfl:    ondansetron  (ZOFRAN ) 8 MG tablet, Take 1 tablet (8 mg total) by mouth every 8 (eight) hours as needed for nausea or vomiting. Start on the third day after chemotherapy., Disp: 30 tablet, Rfl: 1   oxyCODONE  (OXY IR/ROXICODONE ) 5 MG immediate release tablet, Take 1 tablet (5 mg total) by mouth every 6 (six) hours as needed for up to 7 days for severe pain (pain score 7-10)., Disp: 28 tablet, Rfl: 0   prochlorperazine  (COMPAZINE ) 10 MG tablet, Take 1 tablet (10 mg total) by mouth every 6 (six) hours as needed for nausea or vomiting., Disp: 30 tablet, Rfl: 1   rosuvastatin (CRESTOR) 10 MG tablet, Take 10 mg by mouth daily., Disp: , Rfl:    tretinoin (RETIN-A) 0.1 % cream, Apply 1 application  topically at bedtime., Disp: , Rfl:   PHYSICAL EXAM ECOG FS:1 - Symptomatic but completely ambulatory    Vitals:   09/08/24 1317  BP: 117/65  Pulse: 82  Resp: 15  Temp: 98.2 F (36.8 C)  TempSrc: Temporal  SpO2: 98%  Weight: 219 lb (99.3 kg)   Physical Exam Vitals and nursing note reviewed.  Constitutional:      Appearance: She is not ill-appearing or toxic-appearing.  HENT:     Head: Normocephalic.  Eyes:     Conjunctiva/sclera: Conjunctivae normal.  Cardiovascular:     Rate and Rhythm:  Normal rate and regular rhythm.     Pulses: Normal pulses.          Radial pulses are 2+ on the right side.     Heart sounds: Normal heart sounds.  Pulmonary:     Effort: Pulmonary effort is normal.     Breath sounds: Normal breath sounds.  Chest:     Comments:  PAC in right upper chest. Incision intact, no drainage or dehiscence.  Area above port site is edematous and erythematous. No open wounds. Tender to palpation. Abdominal:     General: There is no distension.  Musculoskeletal:     Cervical back: Normal range of motion.     Comments: No swelling of RUE.  Full ROM of all extremities.  Skin:    General: Skin is warm and dry.  Neurological:     Mental Status: She is alert.        LABORATORY DATA I have reviewed the data as listed    Latest Ref Rng & Units 09/03/2024    3:25 PM 07/14/2024    2:53 PM 06/11/2024   12:34 PM  CBC  WBC 4.0 - 10.5 K/uL 10.1  10.6  9.6   Hemoglobin 12.0 - 15.0 g/dL 86.3  86.0  84.9   Hematocrit 36.0 - 46.0 % 40.0  42.4  43.9   Platelets 150 - 400 K/uL 275  263  284         Latest Ref Rng & Units 09/03/2024    3:25 PM 07/14/2024    2:53 PM 06/11/2024   12:34 PM  CMP  Glucose 70 - 99 mg/dL 91  896  87   BUN 8 - 23 mg/dL 17  14  15    Creatinine 0.44 - 1.00 mg/dL 9.17  9.15  9.25   Sodium 135 - 145 mmol/L 138  138  140   Potassium 3.5 - 5.1 mmol/L 3.9  4.3  4.3   Chloride 98 - 111 mmol/L 105  105  105   CO2 22 - 32 mmol/L 24  26  28    Calcium 8.9 - 10.3 mg/dL 8.9  8.8  9.7   Total Protein 6.5 - 8.1 g/dL 7.2   7.5   Total Bilirubin 0.0 - 1.2 mg/dL 0.4   0.6   Alkaline Phos 38 - 126 U/L 74   87   AST 15 - 41 U/L 14   16   ALT 0 - 44 U/L 12   19        RADIOGRAPHIC STUDIES (from last 24 hours if applicable) I have personally reviewed the radiological images as listed and agreed with the findings in the report. No results found.      Visit Diagnosis: 1. Port-A-Cath in place   2. Malignant neoplasm of upper-inner quadrant of left breast in female, estrogen receptor positive (HCC)      No orders of the defined types were placed in this encounter.   All questions were answered. The patient knows to call the clinic with any problems, questions or concerns. No barriers to learning was  detected.  A total of more than 30 minutes were spent on this encounter with face-to-face time and non-face-to-face time, including preparing to see the patient, ordering tests and/or medications, counseling the patient and coordination of care as outlined above.    Thank you for allowing me to participate in the care of this patient.    Kanai Berrios E  Walisiewicz, PA-C Department of Hematology/Oncology [facility] at [facility] Phone: [phone]  [phone]    09/08/2024 3:26 PM

## 2024-09-08 NOTE — Telephone Encounter (Signed)
 S/w patient regarding new onset of discomfort and edema at port-a-cath site. Patient noted start of symptoms yesterday. Attributed it initially to sleeping position, but it has not resolved. Patient on steroids, but had completed medication on 11/8. Patient using ice and pain medication to help with the discomfort.  Denies any fever or shortness of breath. Notes warmth and redness at site.  Patient offered to be assessed in clinic with our symptom management clinic. Patient agreeable to being evaluated.  Patient scheduled with Mallie Combes, PA-C today at 1:30 PM. Appointment scheduled and confirmed with patient.

## 2024-09-10 ENCOUNTER — Emergency Department (HOSPITAL_BASED_OUTPATIENT_CLINIC_OR_DEPARTMENT_OTHER)

## 2024-09-10 ENCOUNTER — Encounter (HOSPITAL_BASED_OUTPATIENT_CLINIC_OR_DEPARTMENT_OTHER): Payer: Self-pay

## 2024-09-10 ENCOUNTER — Other Ambulatory Visit: Payer: Self-pay

## 2024-09-10 ENCOUNTER — Inpatient Hospital Stay (HOSPITAL_BASED_OUTPATIENT_CLINIC_OR_DEPARTMENT_OTHER)
Admission: EM | Admit: 2024-09-10 | Discharge: 2024-09-18 | DRG: 314 | Disposition: A | Discharge status: Home / Self Care | Attending: Internal Medicine | Admitting: Internal Medicine

## 2024-09-10 DIAGNOSIS — J4520 Mild intermittent asthma, uncomplicated: Secondary | ICD-10-CM | POA: Diagnosis present

## 2024-09-10 DIAGNOSIS — R7401 Elevation of levels of liver transaminase levels: Secondary | ICD-10-CM | POA: Diagnosis present

## 2024-09-10 DIAGNOSIS — R911 Solitary pulmonary nodule: Secondary | ICD-10-CM | POA: Diagnosis not present

## 2024-09-10 DIAGNOSIS — Z6836 Body mass index (BMI) 36.0-36.9, adult: Secondary | ICD-10-CM

## 2024-09-10 DIAGNOSIS — Z90710 Acquired absence of both cervix and uterus: Secondary | ICD-10-CM

## 2024-09-10 DIAGNOSIS — I358 Other nonrheumatic aortic valve disorders: Secondary | ICD-10-CM | POA: Diagnosis present

## 2024-09-10 DIAGNOSIS — M47815 Spondylosis without myelopathy or radiculopathy, thoracolumbar region: Secondary | ICD-10-CM | POA: Diagnosis not present

## 2024-09-10 DIAGNOSIS — R059 Cough, unspecified: Secondary | ICD-10-CM

## 2024-09-10 DIAGNOSIS — Z7951 Long term (current) use of inhaled steroids: Secondary | ICD-10-CM

## 2024-09-10 DIAGNOSIS — B9561 Methicillin susceptible Staphylococcus aureus infection as the cause of diseases classified elsewhere: Principal | ICD-10-CM | POA: Diagnosis present

## 2024-09-10 DIAGNOSIS — Z8616 Personal history of COVID-19: Secondary | ICD-10-CM

## 2024-09-10 DIAGNOSIS — D6959 Other secondary thrombocytopenia: Secondary | ICD-10-CM | POA: Diagnosis present

## 2024-09-10 DIAGNOSIS — Z95828 Presence of other vascular implants and grafts: Secondary | ICD-10-CM

## 2024-09-10 DIAGNOSIS — M545 Low back pain, unspecified: Secondary | ICD-10-CM | POA: Diagnosis present

## 2024-09-10 DIAGNOSIS — I071 Rheumatic tricuspid insufficiency: Secondary | ICD-10-CM | POA: Diagnosis present

## 2024-09-10 DIAGNOSIS — K5730 Diverticulosis of large intestine without perforation or abscess without bleeding: Secondary | ICD-10-CM | POA: Diagnosis not present

## 2024-09-10 DIAGNOSIS — R7881 Bacteremia: Secondary | ICD-10-CM | POA: Diagnosis present

## 2024-09-10 DIAGNOSIS — A419 Sepsis, unspecified organism: Principal | ICD-10-CM | POA: Diagnosis present

## 2024-09-10 DIAGNOSIS — E871 Hypo-osmolality and hyponatremia: Secondary | ICD-10-CM | POA: Diagnosis present

## 2024-09-10 DIAGNOSIS — Z806 Family history of leukemia: Secondary | ICD-10-CM

## 2024-09-10 DIAGNOSIS — Z79899 Other long term (current) drug therapy: Secondary | ICD-10-CM

## 2024-09-10 DIAGNOSIS — E785 Hyperlipidemia, unspecified: Secondary | ICD-10-CM | POA: Diagnosis present

## 2024-09-10 DIAGNOSIS — Z8701 Personal history of pneumonia (recurrent): Secondary | ICD-10-CM

## 2024-09-10 DIAGNOSIS — N281 Cyst of kidney, acquired: Secondary | ICD-10-CM | POA: Diagnosis not present

## 2024-09-10 DIAGNOSIS — E876 Hypokalemia: Secondary | ICD-10-CM | POA: Diagnosis present

## 2024-09-10 DIAGNOSIS — Z170 Estrogen receptor positive status [ER+]: Secondary | ICD-10-CM

## 2024-09-10 DIAGNOSIS — A4101 Sepsis due to Methicillin susceptible Staphylococcus aureus: Secondary | ICD-10-CM | POA: Diagnosis present

## 2024-09-10 DIAGNOSIS — F419 Anxiety disorder, unspecified: Secondary | ICD-10-CM | POA: Diagnosis present

## 2024-09-10 DIAGNOSIS — Y712 Prosthetic and other implants, materials and accessory cardiovascular devices associated with adverse incidents: Secondary | ICD-10-CM | POA: Diagnosis present

## 2024-09-10 DIAGNOSIS — M549 Dorsalgia, unspecified: Secondary | ICD-10-CM | POA: Diagnosis not present

## 2024-09-10 DIAGNOSIS — L03313 Cellulitis of chest wall: Secondary | ICD-10-CM | POA: Diagnosis present

## 2024-09-10 DIAGNOSIS — Z823 Family history of stroke: Secondary | ICD-10-CM

## 2024-09-10 DIAGNOSIS — D849 Immunodeficiency, unspecified: Secondary | ICD-10-CM | POA: Diagnosis present

## 2024-09-10 DIAGNOSIS — Z803 Family history of malignant neoplasm of breast: Secondary | ICD-10-CM

## 2024-09-10 DIAGNOSIS — Z9221 Personal history of antineoplastic chemotherapy: Secondary | ICD-10-CM

## 2024-09-10 DIAGNOSIS — Z825 Family history of asthma and other chronic lower respiratory diseases: Secondary | ICD-10-CM

## 2024-09-10 DIAGNOSIS — I10 Essential (primary) hypertension: Secondary | ICD-10-CM | POA: Diagnosis present

## 2024-09-10 DIAGNOSIS — T80211A Bloodstream infection due to central venous catheter, initial encounter: Principal | ICD-10-CM | POA: Diagnosis present

## 2024-09-10 DIAGNOSIS — E8770 Fluid overload, unspecified: Secondary | ICD-10-CM | POA: Diagnosis present

## 2024-09-10 DIAGNOSIS — E875 Hyperkalemia: Secondary | ICD-10-CM | POA: Diagnosis present

## 2024-09-10 DIAGNOSIS — I371 Nonrheumatic pulmonary valve insufficiency: Secondary | ICD-10-CM | POA: Diagnosis present

## 2024-09-10 DIAGNOSIS — E66812 Obesity, class 2: Secondary | ICD-10-CM | POA: Diagnosis present

## 2024-09-10 DIAGNOSIS — Z1152 Encounter for screening for COVID-19: Secondary | ICD-10-CM

## 2024-09-10 DIAGNOSIS — C50212 Malignant neoplasm of upper-inner quadrant of left female breast: Secondary | ICD-10-CM | POA: Diagnosis present

## 2024-09-10 DIAGNOSIS — R0989 Other specified symptoms and signs involving the circulatory and respiratory systems: Secondary | ICD-10-CM | POA: Diagnosis not present

## 2024-09-10 LAB — CBC WITH DIFFERENTIAL/PLATELET
Abs Immature Granulocytes: 0.24 K/uL — ABNORMAL HIGH (ref 0.00–0.07)
Basophils Absolute: 0.1 K/uL (ref 0.0–0.1)
Basophils Relative: 2 %
Eosinophils Absolute: 0 K/uL (ref 0.0–0.5)
Eosinophils Relative: 1 %
HCT: 39.2 % (ref 36.0–46.0)
Hemoglobin: 13.2 g/dL (ref 12.0–15.0)
Immature Granulocytes: 6 %
Lymphocytes Relative: 19 %
Lymphs Abs: 0.8 K/uL (ref 0.7–4.0)
MCH: 28.3 pg (ref 26.0–34.0)
MCHC: 33.7 g/dL (ref 30.0–36.0)
MCV: 84.1 fL (ref 80.0–100.0)
Monocytes Absolute: 0.6 K/uL (ref 0.1–1.0)
Monocytes Relative: 14 %
Neutro Abs: 2.4 K/uL (ref 1.7–7.7)
Neutrophils Relative %: 58 %
Platelets: 196 K/uL (ref 150–400)
RBC: 4.66 MIL/uL (ref 3.87–5.11)
RDW: 14.7 % (ref 11.5–15.5)
WBC: 4.3 K/uL (ref 4.0–10.5)
nRBC: 0 % (ref 0.0–0.2)

## 2024-09-10 LAB — URINALYSIS, W/ REFLEX TO CULTURE (INFECTION SUSPECTED)
Bacteria, UA: NONE SEEN
Bilirubin Urine: NEGATIVE
Glucose, UA: NEGATIVE mg/dL
Ketones, ur: NEGATIVE mg/dL
Leukocytes,Ua: NEGATIVE
Nitrite: NEGATIVE
Protein, ur: 30 mg/dL — AB
Specific Gravity, Urine: 1.021 (ref 1.005–1.030)
pH: 7 (ref 5.0–8.0)

## 2024-09-10 LAB — COMPREHENSIVE METABOLIC PANEL WITH GFR
ALT: 54 U/L — ABNORMAL HIGH (ref 0–44)
AST: 53 U/L — ABNORMAL HIGH (ref 15–41)
Albumin: 3.8 g/dL (ref 3.5–5.0)
Alkaline Phosphatase: 109 U/L (ref 38–126)
Anion gap: 11 (ref 5–15)
BUN: 12 mg/dL (ref 8–23)
CO2: 23 mmol/L (ref 22–32)
Calcium: 8.8 mg/dL — ABNORMAL LOW (ref 8.9–10.3)
Chloride: 97 mmol/L — ABNORMAL LOW (ref 98–111)
Creatinine, Ser: 0.67 mg/dL (ref 0.44–1.00)
GFR, Estimated: >60 mL/min (ref >60)
Glucose, Bld: 138 mg/dL — ABNORMAL HIGH (ref 70–99)
Potassium: 5.5 mmol/L — ABNORMAL HIGH (ref 3.5–5.1)
Sodium: 132 mmol/L — ABNORMAL LOW (ref 135–145)
Total Bilirubin: 0.7 mg/dL (ref 0.0–1.2)
Total Protein: 6.5 g/dL (ref 6.5–8.1)

## 2024-09-10 LAB — RESP PANEL BY RT-PCR (RSV, FLU A&B, COVID)  RVPGX2
Influenza A by PCR: NEGATIVE
Influenza B by PCR: NEGATIVE
Resp Syncytial Virus by PCR: NEGATIVE
SARS Coronavirus 2 by RT PCR: NEGATIVE

## 2024-09-10 LAB — LACTIC ACID, PLASMA: Lactic Acid, Venous: 1.5 mmol/L (ref 0.5–1.9)

## 2024-09-10 LAB — PROTIME-INR
INR: 0.9 (ref 0.8–1.2)
Prothrombin Time: 13 s (ref 11.4–15.2)

## 2024-09-10 MED ORDER — VANCOMYCIN HCL IN DEXTROSE 1-5 GM/200ML-% IV SOLN: 1000.0000 mg | Freq: Once | INTRAVENOUS | Status: DC

## 2024-09-10 MED ORDER — METRONIDAZOLE 500 MG/100ML IV SOLN
500.0000 mg | Freq: Once | INTRAVENOUS | Status: AC
  Administered 2024-09-10: 500 mg via INTRAVENOUS
  Filled 2024-09-10: qty 100

## 2024-09-10 MED ORDER — VANCOMYCIN HCL IN DEXTROSE 1-5 GM/200ML-% IV SOLN
1000.0000 mg | Freq: Once | INTRAVENOUS | Status: AC
  Administered 2024-09-10: 1000 mg via INTRAVENOUS
  Filled 2024-09-10: qty 200

## 2024-09-10 MED ORDER — ACETAMINOPHEN 500 MG PO TABS
1000.0000 mg | ORAL_TABLET | Freq: Once | ORAL | Status: AC
  Administered 2024-09-10: 1000 mg via ORAL
  Filled 2024-09-10: qty 2

## 2024-09-10 MED ORDER — IOHEXOL 300 MG/ML  SOLN
100.0000 mL | Freq: Once | INTRAMUSCULAR | Status: AC | PRN
  Administered 2024-09-10: 100 mL via INTRAVENOUS

## 2024-09-10 MED ORDER — SODIUM CHLORIDE 0.9 % IV SOLN
2.0000 g | Freq: Once | INTRAVENOUS | Status: AC
  Administered 2024-09-10: 2 g via INTRAVENOUS
  Filled 2024-09-10: qty 12.5

## 2024-09-10 NOTE — Progress Notes (Signed)
 Plan of Care Note for accepted transfer   Patient: Mary Mendez MRN: [id number]   DOA: 09/10/2024  Facility requesting transfer: [facility]   Requesting Provider: Dr. Myrna   Reason for transfer: Fever, immunocompromised   Facility course: 63 yr old female with HTN, HLD, anxiety, asthma, and breast cancer undergoing chemotherapy who presents with acute low back pain, fever, and recent selling and tenderness about the port site in right chest.   She is found to be febrile and tachycardic with normal WBC and normal lactate. No evidence for infection on CXR or UA. CT shows soft tissue stranding along the right chest port.   Blood culture was collected and broad-spectrum antibiotics were started.   Plan of care: The patient is accepted for admission to Med-surg  unit, at [facility]   Author: Evalene GORMAN Sprinkles, MD 09/10/2024  Check [email or web address] for on-call coverage.  Nursing staff, Please call [facility] Admits & Consults System-Wide number on Amion as soon as patient's arrival, so appropriate admitting provider can evaluate the pt.

## 2024-09-10 NOTE — ED Notes (Signed)
 Patient returned to room 13 from CT.

## 2024-09-10 NOTE — ED Provider Notes (Signed)
 [place] EMERGENCY DEPARTMENT AT [facility] Provider Note   CSN: [id number] Arrival date & time: 09/10/24  1807     Patient presents with: Fever   Mary Mendez is a 63 y.o. female with past medical history of estrogen receptor positive breast cancer currently on chemotherapy.  Presents today for evaluation of back pain along with fever that both started this morning.  Her last chemotherapy session was Thursday with further injections on Saturday.  She reports that 3 days ago she noticed her port site was very swollen and slightly tender after which her oncologist ordered an upper extremity Doppler which ruled out DVT.  Since this happened she has been sleeping on her left side and avoiding any pressure on the right arm.  She did wake up in the middle the night last night with back pain that she did have to take an oxycodone  for.  She reports that the back pain is mildly proved and she is gotten up and walked around however it is still persistent.  She was also feeling feverish at home and overall not well which prompted her to take her temperature where it was 101.3.  At this time she presented to the ER.  She describes the pain as aching, comparing it to having back labor.  She denies any radiation down the legs any loss of bowel or bladder, or any tenderness to palpation.  She denies nausea, vomiting, diarrhea, chest pain, shortness of breath, cough, or dysuria.  She does report increased urinary frequency however is unsure the timeline of this symptom.      Prior to Admission medications   Medication Sig Start Date End Date Taking? Authorizing Provider  acetaminophen  (TYLENOL ) 500 MG tablet Take 1,000 mg by mouth every 6 (six) hours as needed for mild pain.    [provider]  albuterol  (PROVENTIL ) (2.5 MG/3ML) 0.083% nebulizer solution Take 3 mLs (2.5 mg total) by nebulization every 6 (six) hours as needed for wheezing or shortness of breath. 11/08/17   Mannam, Praveen,  MD  amLODipine (NORVASC) 5 MG tablet Take 5 mg by mouth daily. Patient taking differently: Take 10 mg by mouth daily. 10/03/22   [provider]  busPIRone (BUSPAR) 10 MG tablet Take 10 mg by mouth 3 (three) times daily as needed (Anxiety). 10/01/22   [provider]  dexamethasone  (DECADRON ) 4 MG tablet Take 2 tabs by mouth 2 times daily starting day before chemo. Then take 2 tabs daily for 2 days starting day after chemo. Take with food. 08/21/24   Iruku, Praveena, MD  lidocaine -prilocaine (EMLA) cream Apply to affected area once 08/21/24   Iruku, Praveena, MD  nystatin (MYCOSTATIN/NYSTOP) powder Apply 1 Application topically 3 (three) times daily. 09/03/24   Iruku, Praveena, MD  omega-3 acid ethyl esters (LOVAZA) 1 g capsule Take 1 g by mouth daily.    [provider]  ondansetron  (ZOFRAN ) 8 MG tablet Take 1 tablet (8 mg total) by mouth every 8 (eight) hours as needed for nausea or vomiting. Start on the third day after chemotherapy. 08/21/24   Iruku, Praveena, MD  oxyCODONE  (OXY IR/ROXICODONE ) 5 MG immediate release tablet Take 1 tablet (5 mg total) by mouth every 6 (six) hours as needed for up to 7 days for severe pain (pain score 7-10). 09/08/24 09/15/24  Walisiewicz, Kaitlyn E, PA-C  prochlorperazine  (COMPAZINE ) 10 MG tablet Take 1 tablet (10 mg total) by mouth every 6 (six) hours as needed for nausea or vomiting. 08/21/24  Iruku, Praveena, MD  rosuvastatin (CRESTOR) 10 MG tablet Take 10 mg by mouth daily.    [provider]  tretinoin (RETIN-A) 0.1 % cream Apply 1 application  topically at bedtime. 09/25/22   [provider]    Allergies: Patient has no known allergies.    Review of Systems  Constitutional:  Positive for fever.  HENT:  Negative for congestion, ear pain, facial swelling, rhinorrhea, sinus pressure, sinus pain and sore throat.   Respiratory:  Negative for chest tightness, shortness of breath and wheezing.   Cardiovascular:   Negative for chest pain and leg swelling.  Gastrointestinal:  Negative for abdominal distention, abdominal pain, blood in stool, constipation, diarrhea, nausea and vomiting.  Genitourinary:  Positive for frequency. Negative for decreased urine volume, difficulty urinating, dysuria, flank pain, pelvic pain and urgency.  Musculoskeletal:  Positive for back pain. Negative for joint swelling, myalgias and neck pain.  Neurological:  Negative for dizziness, tremors, syncope, facial asymmetry, weakness, light-headedness, numbness and headaches.    Updated Vital Signs BP 129/64 (BP Location: Right Arm)   Pulse (!) 103   Temp (!) 100.5 F (38.1 C) (Oral)   Resp (!) 27   SpO2 97%   Physical Exam Constitutional:      General: She is not in acute distress.    Appearance: Normal appearance. She is not toxic-appearing.  HENT:     Head: Normocephalic and atraumatic.     Nose: Nose normal.  Cardiovascular:     Rate and Rhythm: Regular rhythm. Tachycardia present.     Pulses: Normal pulses.     Heart sounds: No murmur heard.    No friction rub. No gallop.  Pulmonary:     Effort: Pulmonary effort is normal. No respiratory distress.     Breath sounds: No stridor. No wheezing, rhonchi or rales.  Chest:     Chest wall: No tenderness.  Abdominal:     General: Abdomen is flat. There is no distension.     Palpations: Abdomen is soft.     Tenderness: There is no abdominal tenderness. There is no guarding.  Musculoskeletal:        General: No tenderness.     Comments: Lower back nontender to palpation.     (all labs ordered are listed, but only abnormal results are displayed) Labs Reviewed  COMPREHENSIVE METABOLIC PANEL WITH GFR - Abnormal; Notable for the following components:      Result Value   Sodium 132 (*)    Potassium 5.5 (*)    Chloride 97 (*)    Glucose, Bld 138 (*)    Calcium 8.8 (*)    AST 53 (*)    ALT 54 (*)    All other components within normal limits  CBC WITH  DIFFERENTIAL/PLATELET - Abnormal; Notable for the following components:   Abs Immature Granulocytes 0.24 (*)    All other components within normal limits  URINALYSIS, W/ REFLEX TO CULTURE (INFECTION SUSPECTED) - Abnormal; Notable for the following components:   Hgb urine dipstick TRACE (*)    Protein, ur 30 (*)    All other components within normal limits  RESP PANEL BY RT-PCR (RSV, FLU A&B, COVID)  [id number]  CULTURE, BLOOD (ROUTINE X 2)  CULTURE, BLOOD (ROUTINE X 2)  LACTIC ACID, PLASMA  PROTIME-INR  LACTIC ACID, PLASMA    EKG: None  Radiology: CT L-SPINE NO CHARGE Result Date: 09/10/2024 EXAM: CT OF THE LUMBAR SPINE WITHOUT CONTRAST 09/10/2024 09:27:51 PM TECHNIQUE: CT of the lumbar spine  was performed without the administration of intravenous contrast. Multiplanar reformatted images are provided for review. Automated exposure control, iterative reconstruction, and/or weight based adjustment of the mA/kV was utilized to reduce the radiation dose to as low as reasonably achievable. COMPARISON: None available. CLINICAL HISTORY: FINDINGS: BONES AND ALIGNMENT: Normal vertebral body heights. No acute fracture or suspicious bone lesion. Normal alignment. DEGENERATIVE CHANGES: Mild degenerative changes of the visualized thoracolumbar spine. SOFT TISSUES: No acute abnormality. IMPRESSION: 1. Mild degenerative changes of the visualized thoracolumbar spine. Electronically signed by: Pinkie Pebbles MD 09/10/2024 09:35 PM EST RP Workstation: [id number]   CT CHEST ABDOMEN PELVIS W CONTRAST Result Date: 09/10/2024 EXAM: CT CHEST, ABDOMEN AND PELVIS WITH CONTRAST 09/10/2024 09:27:51 PM TECHNIQUE: CT of the chest, abdomen and pelvis was performed with the administration of 100 mL of iohexol (OMNIPAQUE) 300 MG/ML solution. Multiplanar reformatted images are provided for review. Automated exposure control, iterative reconstruction, and/or weight based adjustment of the mA/kV was utilized to reduce the  radiation dose to as low as reasonably achievable. COMPARISON: CT abdomen and pelvis dated 10/10/2022 and CT chest dated 04/27/2022. CLINICAL HISTORY: Fever of unknown origin (Ped > 37mo); fever, port with swelling. FINDINGS: CHEST: MEDIASTINUM AND LYMPH NODES: Heart and pericardium are unremarkable. The central airways are clear. No mediastinal, hilar or axillary lymphadenopathy. Right chest port terminates in the lower SVC. LUNGS AND PLEURA: 8 mm right lower lobe nodule (image 63), new from prior. Follow-up CT chest or PET CT is suggested in 3 months. Mild left basilar scarring/atelectasis. No pleural effusion or pneumothorax. ABDOMEN AND PELVIS: LIVER: The liver is unremarkable. GALLBLADDER AND BILE DUCTS: Status post cholecystectomy. No biliary ductal dilatation. SPLEEN: No acute abnormality. PANCREAS: No acute abnormality. ADRENAL GLANDS: No acute abnormality. KIDNEYS, URETERS AND BLADDER: Scattered subcentimeter right renal cysts, benign (Bosniak I). No follow-up is recommended. No stones in the kidneys or ureters. No hydronephrosis. No perinephric or periureteral stranding. Urinary bladder is unremarkable. GI AND BOWEL: Stomach demonstrates no acute abnormality. Mild sigmoid diverticulosis, without evidence of diverticulitis. There is no bowel obstruction. REPRODUCTIVE ORGANS: Status post hysterectomy. PERITONEUM AND RETROPERITONEUM: No ascites. No free air. VASCULATURE: Aorta is normal in caliber. ABDOMINAL AND PELVIS LYMPH NODES: No lymphadenopathy. BONES AND SOFT TISSUES: Degenerative changes of the visualized thoracolumbar spine. Status post left breast lumpectomy and bilateral mammoplasties. Surgical clips in the left axilla. No acute osseous abnormality. Mild subcutaneous stranding along the patient's right chest port (images 4 and 7), correlate for cellulitis . IMPRESSION: 1. Mild subcutaneous standing along the right chest port, correlate for cellulitis. 2. New 8 mm solid right lower lobe pulmonary  nodule, indeterminate. Follow-up CT chest or PET-CT is suggested in 3 months. Electronically signed by: Pinkie Pebbles MD 09/10/2024 09:34 PM EST RP Workstation: [id number]   DG Chest Port 1 View if patient is in a treatment room. Result Date: 09/10/2024 EXAM: 1 VIEW(S) XRAY OF THE CHEST 09/10/2024 07:00:00 PM COMPARISON: 08/18/2024 CLINICAL HISTORY: Possible sepsis and back pain. FINDINGS: LUNGS AND PLEURA: Mild central vascular congestion is noted without interstitial edema. No focal infiltrate is seen. No pleural effusion. No pneumothorax. HEART AND MEDIASTINUM: The cardiac shadow is within normal limits. BONES AND SOFT TISSUES: No bony abnormality is noted. IMPRESSION: 1. Mild central vascular congestion without interstitial edema. Electronically signed by: Oneil Devonshire MD 09/10/2024 07:05 PM EST RP Workstation: [id number]    Medications Ordered in the ED  ceFEPIme (MAXIPIME) 2 g in sodium chloride  0.9 % 100 mL IVPB (0 g Intravenous Stopped 09/10/24 2017)  metroNIDAZOLE (FLAGYL) IVPB 500 mg (0 mg Intravenous Stopped 09/10/24 2039)  vancomycin (VANCOCIN) IVPB 1000 mg/200 mL premix (0 mg Intravenous Stopped 09/10/24 2241)    Followed by  vancomycin (VANCOCIN) IVPB 1000 mg/200 mL premix (0 mg Intravenous Stopped 09/10/24 2241)  acetaminophen  (TYLENOL ) tablet 1,000 mg (1,000 mg Oral Given 09/10/24 2017)  iohexol (OMNIPAQUE) 300 MG/ML solution 100 mL (100 mLs Intravenous Contrast Given 09/10/24 2117)    Medical Decision Making Amount and/or Complexity of Data Reviewed Labs: ordered. Radiology: ordered.  Risk OTC drugs. Prescription drug management. Decision regarding hospitalization.   Suspected bacteremia Back pain Immunocompromise The patient presented for evaluation of fever and back pain which started around 5 AM this morning.  On exam, her back is nontender to palpation and she continues to deny neurological symptoms.  She does report swelling and tenderness of her port 3 days ago  at which time she underwent a venous duplex which ruled out DVT in the right upper extremity. With her fever and persistent tachycardia, along with her recent port swelling and pain, as well as potential immunocompromise from chemotherapy, concern for bacteremia and infection of unknown source.  She does continue to have back pain and persistent fever however unsure if these 2 things are related but cannot fully rule this out at this time.  She would likely benefit from MRI of the lumbar spine. Sepsis workup in process including labs, blood cultures, urinalysis, and chest x-ray.   CTAP with mild subcutaneous stranding along the right chest port. CT L spine unremarkable.  At this time, concern that port is the source of infection. Empiric IV antibiotics initiated to include cefepime, Flagyl, and Vanc. The patient will need to be admitted for continued work up and IV antibiotics. [facility] hospitalists consulted and accepted admission.     Final diagnoses:  Sepsis without acute organ dysfunction, due to unspecified organism [facility])    ED Discharge Orders     None          Myrna Bitters, DO 09/10/24 2246    Dreama Longs, MD 09/11/24 [phone]

## 2024-09-10 NOTE — Progress Notes (Signed)
 ED Pharmacy Antibiotic Sign Off An antibiotic consult was received from an ED provider for cefepime and vancomycin per pharmacy dosing for sepsis. A chart review was completed to assess appropriateness.  A single dose of cefepime and vancomycin placed by the ED provider.   The following one time order(s) were placed per pharmacy consult:  none  Further antibiotic and/or antibiotic pharmacy consults should be ordered by the admitting provider if indicated.   Thank you for allowing pharmacy to be a part of this patient's care.   Dorn Buttner, PharmD, BCPS 09/10/2024 7:06 PM ED Clinical Pharmacist -  [phone]

## 2024-09-10 NOTE — ED Notes (Signed)
 UNABLE TO OBTAIN 2ND BLOOD CULTURES DUE TO RESTRICTED TO RIGHT ARM ONLY & ID DIFFICULT STICK

## 2024-09-10 NOTE — ED Triage Notes (Signed)
 Reports lower back pain with fever of 101.3. Has not taken any tylenol .   Chemo Thursday for stage 1 breast cancer.

## 2024-09-11 ENCOUNTER — Encounter (HOSPITAL_COMMUNITY): Payer: Self-pay | Admitting: Family Medicine

## 2024-09-11 ENCOUNTER — Encounter: Payer: Self-pay | Admitting: *Deleted

## 2024-09-11 DIAGNOSIS — R7881 Bacteremia: Secondary | ICD-10-CM | POA: Diagnosis not present

## 2024-09-11 DIAGNOSIS — D696 Thrombocytopenia, unspecified: Secondary | ICD-10-CM | POA: Diagnosis not present

## 2024-09-11 DIAGNOSIS — R918 Other nonspecific abnormal finding of lung field: Secondary | ICD-10-CM | POA: Diagnosis not present

## 2024-09-11 DIAGNOSIS — Z8616 Personal history of COVID-19: Secondary | ICD-10-CM | POA: Diagnosis not present

## 2024-09-11 DIAGNOSIS — R7401 Elevation of levels of liver transaminase levels: Secondary | ICD-10-CM | POA: Diagnosis present

## 2024-09-11 DIAGNOSIS — E876 Hypokalemia: Secondary | ICD-10-CM | POA: Diagnosis present

## 2024-09-11 DIAGNOSIS — E785 Hyperlipidemia, unspecified: Secondary | ICD-10-CM | POA: Diagnosis present

## 2024-09-11 DIAGNOSIS — Z803 Family history of malignant neoplasm of breast: Secondary | ICD-10-CM | POA: Diagnosis not present

## 2024-09-11 DIAGNOSIS — Z7951 Long term (current) use of inhaled steroids: Secondary | ICD-10-CM | POA: Diagnosis not present

## 2024-09-11 DIAGNOSIS — R7989 Other specified abnormal findings of blood chemistry: Secondary | ICD-10-CM | POA: Diagnosis not present

## 2024-09-11 DIAGNOSIS — Z1152 Encounter for screening for COVID-19: Secondary | ICD-10-CM | POA: Diagnosis not present

## 2024-09-11 DIAGNOSIS — E875 Hyperkalemia: Secondary | ICD-10-CM | POA: Diagnosis not present

## 2024-09-11 DIAGNOSIS — Z6836 Body mass index (BMI) 36.0-36.9, adult: Secondary | ICD-10-CM | POA: Diagnosis not present

## 2024-09-11 DIAGNOSIS — F419 Anxiety disorder, unspecified: Secondary | ICD-10-CM | POA: Diagnosis present

## 2024-09-11 DIAGNOSIS — L03313 Cellulitis of chest wall: Secondary | ICD-10-CM | POA: Diagnosis not present

## 2024-09-11 DIAGNOSIS — T80211A Bloodstream infection due to central venous catheter, initial encounter: Secondary | ICD-10-CM | POA: Diagnosis present

## 2024-09-11 DIAGNOSIS — D849 Immunodeficiency, unspecified: Secondary | ICD-10-CM | POA: Diagnosis present

## 2024-09-11 DIAGNOSIS — R911 Solitary pulmonary nodule: Secondary | ICD-10-CM | POA: Diagnosis not present

## 2024-09-11 DIAGNOSIS — A419 Sepsis, unspecified organism: Secondary | ICD-10-CM | POA: Diagnosis not present

## 2024-09-11 DIAGNOSIS — T80212A Local infection due to central venous catheter, initial encounter: Secondary | ICD-10-CM | POA: Diagnosis not present

## 2024-09-11 DIAGNOSIS — B9561 Methicillin susceptible Staphylococcus aureus infection as the cause of diseases classified elsewhere: Secondary | ICD-10-CM | POA: Diagnosis not present

## 2024-09-11 DIAGNOSIS — Z806 Family history of leukemia: Secondary | ICD-10-CM | POA: Diagnosis not present

## 2024-09-11 DIAGNOSIS — E66812 Obesity, class 2: Secondary | ICD-10-CM | POA: Diagnosis present

## 2024-09-11 DIAGNOSIS — A4101 Sepsis due to Methicillin susceptible Staphylococcus aureus: Secondary | ICD-10-CM | POA: Diagnosis present

## 2024-09-11 DIAGNOSIS — Z170 Estrogen receptor positive status [ER+]: Secondary | ICD-10-CM | POA: Diagnosis not present

## 2024-09-11 DIAGNOSIS — M47816 Spondylosis without myelopathy or radiculopathy, lumbar region: Secondary | ICD-10-CM | POA: Diagnosis not present

## 2024-09-11 DIAGNOSIS — M47817 Spondylosis without myelopathy or radiculopathy, lumbosacral region: Secondary | ICD-10-CM | POA: Diagnosis not present

## 2024-09-11 DIAGNOSIS — C50212 Malignant neoplasm of upper-inner quadrant of left female breast: Secondary | ICD-10-CM | POA: Diagnosis not present

## 2024-09-11 DIAGNOSIS — I10 Essential (primary) hypertension: Secondary | ICD-10-CM | POA: Diagnosis present

## 2024-09-11 DIAGNOSIS — I361 Nonrheumatic tricuspid (valve) insufficiency: Secondary | ICD-10-CM | POA: Diagnosis not present

## 2024-09-11 DIAGNOSIS — D6959 Other secondary thrombocytopenia: Secondary | ICD-10-CM | POA: Diagnosis present

## 2024-09-11 DIAGNOSIS — E871 Hypo-osmolality and hyponatremia: Secondary | ICD-10-CM | POA: Diagnosis present

## 2024-09-11 DIAGNOSIS — I340 Nonrheumatic mitral (valve) insufficiency: Secondary | ICD-10-CM | POA: Diagnosis not present

## 2024-09-11 DIAGNOSIS — J4520 Mild intermittent asthma, uncomplicated: Secondary | ICD-10-CM | POA: Diagnosis not present

## 2024-09-11 DIAGNOSIS — Y712 Prosthetic and other implants, materials and accessory cardiovascular devices associated with adverse incidents: Secondary | ICD-10-CM | POA: Diagnosis present

## 2024-09-11 DIAGNOSIS — Z79899 Other long term (current) drug therapy: Secondary | ICD-10-CM | POA: Diagnosis not present

## 2024-09-11 DIAGNOSIS — M545 Low back pain, unspecified: Secondary | ICD-10-CM | POA: Diagnosis not present

## 2024-09-11 DIAGNOSIS — Z95828 Presence of other vascular implants and grafts: Secondary | ICD-10-CM | POA: Diagnosis not present

## 2024-09-11 DIAGNOSIS — T827XXA Infection and inflammatory reaction due to other cardiac and vascular devices, implants and grafts, initial encounter: Secondary | ICD-10-CM | POA: Diagnosis not present

## 2024-09-11 DIAGNOSIS — I071 Rheumatic tricuspid insufficiency: Secondary | ICD-10-CM | POA: Diagnosis present

## 2024-09-11 LAB — BLOOD CULTURE ID PANEL (REFLEXED) - BCID2

## 2024-09-11 MED ORDER — ACETAMINOPHEN 325 MG PO TABS
650.0000 mg | ORAL_TABLET | Freq: Four times a day (QID) | ORAL | Status: DC | PRN
  Administered 2024-09-12 – 2024-09-18 (×10): 650 mg via ORAL
  Filled 2024-09-11 (×10): qty 2

## 2024-09-11 MED ORDER — ALBUTEROL SULFATE (2.5 MG/3ML) 0.083% IN NEBU: 2.5000 mg | INHALATION_SOLUTION | RESPIRATORY_TRACT | Status: DC | PRN

## 2024-09-11 MED ORDER — SODIUM CHLORIDE 0.9 % IV SOLN
2.0000 g | Freq: Three times a day (TID) | INTRAVENOUS | Status: DC
  Administered 2024-09-11: 2 g via INTRAVENOUS
  Filled 2024-09-11: qty 12.5

## 2024-09-11 MED ORDER — SODIUM CHLORIDE 0.9 % IV SOLN: INTRAVENOUS | Status: DC

## 2024-09-11 MED ORDER — ACETAMINOPHEN 325 MG PO TABS
650.0000 mg | ORAL_TABLET | Freq: Four times a day (QID) | ORAL | Status: DC | PRN
  Administered 2024-09-11 (×3): 650 mg via ORAL
  Filled 2024-09-11 (×3): qty 2

## 2024-09-11 MED ORDER — MONTELUKAST SODIUM 10 MG PO TABS
10.0000 mg | ORAL_TABLET | Freq: Every day | ORAL | Status: DC
  Administered 2024-09-11 – 2024-09-17 (×7): 10 mg via ORAL
  Filled 2024-09-11 (×8): qty 1

## 2024-09-11 MED ORDER — PROCHLORPERAZINE MALEATE 10 MG PO TABS
10.0000 mg | ORAL_TABLET | Freq: Four times a day (QID) | ORAL | Status: DC | PRN
  Administered 2024-09-11: 10 mg via ORAL
  Filled 2024-09-11: qty 1

## 2024-09-11 MED ORDER — PROCHLORPERAZINE EDISYLATE 10 MG/2ML IJ SOLN: 10.0000 mg | Freq: Four times a day (QID) | INTRAMUSCULAR | Status: DC | PRN

## 2024-09-11 MED ORDER — ACETAMINOPHEN 650 MG RE SUPP: 650.0000 mg | Freq: Four times a day (QID) | RECTAL | Status: DC | PRN

## 2024-09-11 MED ORDER — ONDANSETRON HCL 4 MG PO TABS: 8.0000 mg | ORAL_TABLET | Freq: Three times a day (TID) | ORAL | Status: DC | PRN

## 2024-09-11 MED ORDER — LACTATED RINGERS IV SOLN: 150.0000 mL/h | INTRAVENOUS | Status: DC

## 2024-09-11 MED ORDER — VANCOMYCIN HCL 1500 MG/300ML IV SOLN
1500.0000 mg | INTRAVENOUS | Status: DC
  Filled 2024-09-11: qty 300

## 2024-09-11 MED ORDER — ROSUVASTATIN CALCIUM 10 MG PO TABS
10.0000 mg | ORAL_TABLET | Freq: Every day | ORAL | Status: DC
  Administered 2024-09-11 – 2024-09-18 (×8): 10 mg via ORAL
  Filled 2024-09-11 (×8): qty 1

## 2024-09-11 MED ORDER — ONDANSETRON HCL 4 MG PO TABS
4.0000 mg | ORAL_TABLET | Freq: Four times a day (QID) | ORAL | Status: DC | PRN
  Administered 2024-09-11: 4 mg via ORAL
  Filled 2024-09-11: qty 1

## 2024-09-11 MED ORDER — CEFAZOLIN SODIUM-DEXTROSE 2-4 GM/100ML-% IV SOLN
2.0000 g | Freq: Three times a day (TID) | INTRAVENOUS | Status: DC
  Administered 2024-09-11 – 2024-09-18 (×22): 2 g via INTRAVENOUS
  Filled 2024-09-11 (×23): qty 100

## 2024-09-11 MED ORDER — SODIUM ZIRCONIUM CYCLOSILICATE 10 G PO PACK
10.0000 g | PACK | Freq: Every day | ORAL | Status: AC
  Administered 2024-09-11: 10 g via ORAL
  Filled 2024-09-11: qty 1

## 2024-09-11 MED ORDER — LACTATED RINGERS IV BOLUS (SEPSIS): 1000.0000 mL | Freq: Once | INTRAVENOUS | Status: DC

## 2024-09-11 MED ORDER — ENOXAPARIN SODIUM 60 MG/0.6ML IJ SOSY
50.0000 mg | PREFILLED_SYRINGE | INTRAMUSCULAR | Status: DC
  Administered 2024-09-11 – 2024-09-13 (×3): 50 mg via SUBCUTANEOUS
  Filled 2024-09-11 (×5): qty 0.6

## 2024-09-11 MED ORDER — SODIUM CHLORIDE 0.9 % IV BOLUS
1000.0000 mL | Freq: Once | INTRAVENOUS | Status: AC
  Administered 2024-09-11: 1000 mL via INTRAVENOUS

## 2024-09-11 MED ORDER — METRONIDAZOLE 500 MG/100ML IV SOLN
500.0000 mg | Freq: Two times a day (BID) | INTRAVENOUS | Status: DC
  Administered 2024-09-11: 500 mg via INTRAVENOUS
  Filled 2024-09-11: qty 100

## 2024-09-11 MED ORDER — KETOROLAC TROMETHAMINE 30 MG/ML IJ SOLN
30.0000 mg | Freq: Four times a day (QID) | INTRAMUSCULAR | Status: AC
  Administered 2024-09-11 (×2): 30 mg via INTRAVENOUS
  Filled 2024-09-11 (×2): qty 1

## 2024-09-11 MED ORDER — HYDROMORPHONE HCL 1 MG/ML IJ SOLN
0.5000 mg | INTRAMUSCULAR | Status: AC | PRN
  Administered 2024-09-11 – 2024-09-14 (×3): 0.5 mg via INTRAVENOUS
  Filled 2024-09-11: qty 0.5
  Filled 2024-09-11: qty 1
  Filled 2024-09-11: qty 0.5

## 2024-09-11 MED ORDER — AMLODIPINE BESYLATE 10 MG PO TABS
10.0000 mg | ORAL_TABLET | Freq: Every day | ORAL | Status: DC
  Administered 2024-09-11 – 2024-09-18 (×8): 10 mg via ORAL
  Filled 2024-09-11 (×8): qty 1

## 2024-09-11 MED ORDER — OXYCODONE HCL 5 MG PO TABS
5.0000 mg | ORAL_TABLET | Freq: Four times a day (QID) | ORAL | Status: DC | PRN
  Administered 2024-09-11: 5 mg via ORAL
  Filled 2024-09-11: qty 1

## 2024-09-11 MED ORDER — LORATADINE 10 MG PO TABS
10.0000 mg | ORAL_TABLET | Freq: Every day | ORAL | Status: DC
  Administered 2024-09-11 – 2024-09-18 (×8): 10 mg via ORAL
  Filled 2024-09-11 (×8): qty 1

## 2024-09-11 MED ORDER — ONDANSETRON HCL 4 MG/2ML IJ SOLN: 4.0000 mg | Freq: Four times a day (QID) | INTRAMUSCULAR | Status: DC | PRN

## 2024-09-11 NOTE — ED Notes (Signed)
 Called [facility] to transport the patient to [facility] rm# [phone]

## 2024-09-11 NOTE — ED Notes (Signed)
 Patient provided with ginger ale per request. Patient states no other needs at this time.

## 2024-09-11 NOTE — Progress Notes (Signed)
 PHARMACY - PHYSICIAN COMMUNICATION CRITICAL VALUE ALERT - BLOOD CULTURE IDENTIFICATION (BCID)  Mary Mendez is an 63 y.o. female who presented to [facility] on 09/10/2024 with a chief complaint of back pain, fever, and tenderness around R-chest PAC  Assessment: 58 YOF with breast CA on chemo with presumed PAC infection. Now with both bottles of 1 set (only 1 set drawn) growing GPC in clusters with BCID detecting MSSA.   Name of physician (or Provider) Contacted: Celinda / Manandhar (automatic ID consult)  Current antibiotics: Vancomycin + Cefepime + Flagyl  Changes to prescribed antibiotics recommended:  Narrow to Cefazolin  2g IV every 8 hours - automatic ID consult  Results for orders placed or performed during the hospital encounter of 09/10/24  Blood Culture ID Panel (Reflexed) (Collected: 09/10/2024  6:49 PM)  Result Value Ref Range   Enterococcus faecalis NOT DETECTED NOT DETECTED   Enterococcus Faecium NOT DETECTED NOT DETECTED   Listeria monocytogenes NOT DETECTED NOT DETECTED   Staphylococcus species DETECTED (A) NOT DETECTED   Staphylococcus aureus (BCID) DETECTED (A) NOT DETECTED   Staphylococcus epidermidis NOT DETECTED NOT DETECTED   Staphylococcus lugdunensis NOT DETECTED NOT DETECTED   Streptococcus species NOT DETECTED NOT DETECTED   Streptococcus agalactiae NOT DETECTED NOT DETECTED   Streptococcus pneumoniae NOT DETECTED NOT DETECTED   Streptococcus pyogenes NOT DETECTED NOT DETECTED   A.calcoaceticus-baumannii NOT DETECTED NOT DETECTED   Bacteroides fragilis NOT DETECTED NOT DETECTED   Enterobacterales NOT DETECTED NOT DETECTED   Enterobacter cloacae complex NOT DETECTED NOT DETECTED   Escherichia coli NOT DETECTED NOT DETECTED   Klebsiella aerogenes NOT DETECTED NOT DETECTED   Klebsiella oxytoca NOT DETECTED NOT DETECTED   Klebsiella pneumoniae NOT DETECTED NOT DETECTED   Proteus species NOT DETECTED NOT DETECTED   Salmonella species NOT DETECTED NOT DETECTED    Serratia marcescens NOT DETECTED NOT DETECTED   Haemophilus influenzae NOT DETECTED NOT DETECTED   Neisseria meningitidis NOT DETECTED NOT DETECTED   Pseudomonas aeruginosa NOT DETECTED NOT DETECTED   Stenotrophomonas maltophilia NOT DETECTED NOT DETECTED   Candida albicans NOT DETECTED NOT DETECTED   Candida auris NOT DETECTED NOT DETECTED   Candida glabrata NOT DETECTED NOT DETECTED   Candida krusei NOT DETECTED NOT DETECTED   Candida parapsilosis NOT DETECTED NOT DETECTED   Candida tropicalis NOT DETECTED NOT DETECTED   Cryptococcus neoformans/gattii NOT DETECTED NOT DETECTED   Meth resistant mecA/C and MREJ NOT DETECTED NOT DETECTED    Thank you for allowing pharmacy to be a part of this patient's care.  Almarie Lunger, PharmD, BCPS, BCIDP Infectious Diseases Clinical Pharmacist 09/11/2024 1:57 PM   **Pharmacist phone directory can now be found on [email or web address] ([email or web address]).  Listed under [facility].

## 2024-09-11 NOTE — Progress Notes (Signed)
 Pharmacy Antibiotic Note  Mary Mendez is a 63 y.o. female admitted on 09/10/2024 with sepsis.  Pharmacy has been consulted for vancomycin dosing. Pt is febrile with Tmax 103.1 and WBC is WNL. SCr and lactic acid are normal. Pt received first doses of antibiotics last night.   Plan: Vancomycin 1500mg  IV Q24H  F/u renal fxn, C&S, clinical status and peak/trough at SS Narrow abx as able     Temp (24hrs), Avg:101.2 F (38.4 C), Min:99.4 F (37.4 C), Max:103.1 F (39.5 C)  Recent Labs  Lab 09/10/24 1855  WBC 4.3  CREATININE 0.67  LATICACIDVEN 1.5    Estimated Creatinine Clearance: 86.2 mL/min (by C-G formula based on SCr of 0.67 mg/dL).    No Known Allergies  Antimicrobials this admission: Vanc 11/12>> Cefepime 11/12>> Flagyl 11/12>>  Dose adjustments this admission: N/A  Microbiology results: Pending  Thank you for allowing pharmacy to be a part of this patient's care.  Noreen Mackintosh, Vernell Helling 09/11/2024 8:08 AM

## 2024-09-11 NOTE — H&P (Signed)
 History and Physical    Patient: Mary Mendez FMW:[id number] DOB: Mar 24, 1961 DOA: 09/10/2024 DOS: the patient was seen and examined on 09/11/2024 PCP: [name], MD  Patient coming from: Home  Chief Complaint:  Chief Complaint  Patient presents with   Fever   HPI: Mary Mendez is a 63 y.o. female with medical history significant of anxiety, asthma, bronchitis, left breast cancer, COVID-19, hyperlipidemia, hypertension, history of pneumonia class II obesity, constipation, lower back pain who presents to the emergency department with complaints of fever yesterday evening and blood cultures have grown methicillin-sensitive Staphylococcus aureus.  She has been having some erythema and tenderness in the port area.  They were concerned about her surgical incision under her right breast, but there is no erythema or discharge.  This seems to be healing properly.  She rhinorrhea, sore throat, recent wheezing or hemoptysis, but has been having a dry cough since earlier today.  No chest pain, palpitations, diaphoresis, PND, orthopnea or pitting edema of the lower extremities.  No abdominal pain, nausea, emesis, diarrhea, constipation, melena or hematochezia.  No flank pain, dysuria, frequency or hematuria.  No polyuria, polydipsia or blurred vision.   Lab work: Urinalysis showed trace hemoglobin and protein Eulogio 30 mg/dL.  CBC showed a white count 4.3, hemoglobin 13.2 g/dL and platelets 803.  Coronavirus, influenza and RSV PCR was negative.  Normal PT and INR.  CMP shows sodium 132, potassium 5.5, chloride 97 and CO2 23 mmol/L and corrected calcium 8.9 mg/deciliter.  Renal function is unremarkable.  Glucose 138 mg/dL.  LFTs show a mild transaminitis with AST of 53 and ALT of 54 units/L, but the rest of the hepatic functions were normal.  Imaging: Portable 1 view chest radiograph with mild central vascular congestion with interstitial edema.  CT L-spine showing mild degenerative changes of the  visualized thoracolumbar spine.  CT chest/abdomen/pelvis with contrast showing mild subcutaneous stranding along the right chest port, correlate for cellulitis.  New 8 mm solid right lower lobe pulmonary nodule, indeterminate follow-up chest or PET/CT in 3 months.  ED course: Initial vital signs were temperature 99.4 F, pulse 122, respiration 18, BP 173/86 mmHg and O2 sat 98% on room air.  The patient received acetaminophen  1000 mg p.o. x 1, cefepime 2 g IVPB, tinidazole 500 mg IVPB and vancomycin 2000 mg IVPB.  Review of Systems: As mentioned in the history of present illness. All other systems reviewed and are negative.  Past Medical History:  Diagnosis Date   Anxiety    Asthma    Bronchitis    Cancer (HCC) 06/2024   Left breast IDC   COVID-19 08/2021   Hyperlipidemia    Hypertension    Pneumonia    Past Surgical History:  Procedure Laterality Date   ABDOMINAL HYSTERECTOMY  2010   and left ovaries;   BREAST BIOPSY     BREAST BIOPSY Left 06/04/2024   MM LT BREAST BX W LOC DEV 1ST LESION IMAGE BX SPEC STEREO GUIDE 06/04/2024 [facility]   BREAST BIOPSY Left 06/04/2024   MM LT BREAST BX W LOC DEV EA AD LESION IMG BX SPEC STEREO GUIDE 06/04/2024 [facility]   BREAST BIOPSY  07/15/2024   MM LT RADIOACTIVE SEED LOC MAMMO GUIDE 07/15/2024 [facility]   BREAST BIOPSY  07/15/2024   MM LT RADIOACTIVE SEED EA ADD LESION LOC MAMMO GUIDE 07/15/2024 [facility]   BREAST BIOPSY  07/15/2024   MM LT RADIOACTIVE SEED EA ADD LESION LOC MAMMO GUIDE 07/15/2024  [facility]   BREAST BIOPSY  07/15/2024   MM LT RADIOACTIVE SEED EA ADD LESION LOC MAMMO GUIDE 07/15/2024 [facility]   BREAST EXCISIONAL BIOPSY Left    BREAST LUMPECTOMY WITH RADIOACTIVE SEED AND SENTINEL LYMPH NODE BIOPSY Left 07/17/2024   Procedure: BREAST LUMPECTOMY WITH RADIOACTIVE SEED AND SENTINEL LYMPH NODE BIOPSY;  Surgeon: Vernetta Berg, MD;  Location: [facility];  Service: General;  Laterality: Left;   RADIOACTIVE SEED GUIDED LEFT BREAST BRACKETED LUMPECTOMY AND SENTINEL NODE BIOPSY   BREAST REDUCTION SURGERY Bilateral 07/25/2024   Procedure: MAMMOPLASTY, REDUCTION;  Surgeon: Arelia Filippo, MD;  Location: [facility];  Service: Plastics;  Laterality: Bilateral;   BREAST SURGERY     CHOLECYSTECTOMY N/A 02/18/2022   Procedure: LAPAROSCOPIC CHOLECYSTECTOMY;  Surgeon: Signe Mitzie LABOR, MD;  Location: [facility];  Service: General;  Laterality: N/A;   EXCISION, MASS, UPPER EXTREMITY Left 07/17/2024   Procedure: EXCISION, MASS, UPPER EXTREMITY;  Surgeon: Vernetta Berg, MD;  Location: [facility];  Service: General;  Laterality: Left;  EXCISION LEFT SHOULDER MASS   HYSTEROTOMY     PORTACATH PLACEMENT N/A 08/18/2024   Procedure: INSERTION, TUNNELED CENTRAL VENOUS DEVICE, WITH PORT;  Surgeon: Vernetta Berg, MD;  Location:  SURGERY CENTER;  Service: General;  Laterality: N/A;  PORT PLACEMENT WITH ULTRASOUND GUIDANCE   Social History:  reports that she has never smoked. She has never been exposed to tobacco smoke. She has never used smokeless tobacco. She reports current alcohol use. She reports that she does not use drugs.  No Known Allergies  Family History  Problem Relation Age of Onset   Breast cancer Mother 3   Emphysema Father    Stroke Father    Stroke Sister    Asthma Sister    Leukemia Maternal Aunt 13 - 79   Asthma Paternal Uncle    Colon cancer Neg Hx    Colon polyps Neg Hx    Esophageal cancer Neg Hx    Rectal cancer Neg Hx    Stomach cancer Neg Hx     Prior to Admission medications   Medication Sig Start Date End Date Taking? Authorizing Provider  acetaminophen  (TYLENOL ) 500 MG tablet Take 1,000 mg by mouth every 6 (six) hours as needed for mild pain.   Yes [provider]  albuterol  (VENTOLIN  HFA) 108 (90 Base) MCG/ACT inhaler Inhale into the lungs. 08/22/24  Yes [provider]  amLODipine (NORVASC) 10 MG tablet Take 10 mg by mouth  daily. 08/25/24  Yes [provider]  busPIRone (BUSPAR) 10 MG tablet Take 10 mg by mouth 3 (three) times daily as needed (Anxiety). 10/01/22  Yes [provider]  dexamethasone  (DECADRON ) 4 MG tablet Take 2 tabs by mouth 2 times daily starting day before chemo. Then take 2 tabs daily for 2 days starting day after chemo. Take with food. 08/21/24  Yes Iruku, Praveena, MD  fluticasone-salmeterol (ADVAIR) 250-50 MCG/ACT AEPB Inhale 1 puff into the lungs 2 (two) times daily. 08/22/24  Yes [provider]  lidocaine -prilocaine (EMLA) cream Apply to affected area once 08/21/24  Yes Iruku, Praveena, MD  loratadine (CLARITIN) 10 MG tablet Take 10 mg by mouth daily.   Yes [provider]  montelukast (SINGULAIR) 10 MG tablet Take 10 mg by mouth daily. 08/25/24  Yes [provider]  ondansetron  (ZOFRAN ) 8 MG tablet Take 1 tablet (8 mg total) by mouth every 8 (eight) hours as needed for nausea or vomiting. Start on the third day after chemotherapy.  08/21/24  Yes Iruku, Praveena, MD  oxyCODONE  (OXY IR/ROXICODONE ) 5 MG immediate release tablet Take 1 tablet (5 mg total) by mouth every 6 (six) hours as needed for up to 7 days for severe pain (pain score 7-10). 09/08/24 09/15/24 Yes Walisiewicz, Kaitlyn E, PA-C  prochlorperazine  (COMPAZINE ) 10 MG tablet Take 1 tablet (10 mg total) by mouth every 6 (six) hours as needed for nausea or vomiting. 08/21/24  Yes Iruku, Praveena, MD  rosuvastatin (CRESTOR) 10 MG tablet Take 10 mg by mouth daily.   Yes [provider]  tretinoin (RETIN-A) 0.1 % cream Apply 1 application  topically at bedtime. 09/25/22  Yes [provider]  nystatin (MYCOSTATIN/NYSTOP) powder Apply 1 Application topically 3 (three) times daily. 09/03/24   Loretha Ash, MD    Physical Exam: Vitals:   09/11/24 1048 09/11/24 1130 09/11/24 1147 09/11/24 1254  BP:  (!) 104/57  139/70  Pulse: 91 99  100  Resp: 20 (!) 25  18  Temp:   (!) 100.5 F  (38.1 C) (!) 102.7 F (39.3 C)  TempSrc:   Oral Oral  SpO2: 94% 96%  99%  Weight:    99.8 kg  Height:    5' 5 (1.651 m)   Physical Exam Vitals and nursing note reviewed.  Constitutional:      General: She is awake. She is not in acute distress.    Appearance: She is obese. She is ill-appearing.  HENT:     Head: Normocephalic.     Nose: No rhinorrhea.     Mouth/Throat:     Mouth: Mucous membranes are moist.  Eyes:     General: No scleral icterus.    Pupils: Pupils are equal, round, and reactive to light.  Neck:     Vascular: No JVD.  Cardiovascular:     Rate and Rhythm: Normal rate and regular rhythm.     Heart sounds: S1 normal and S2 normal.  Pulmonary:     Effort: Pulmonary effort is normal.     Breath sounds: Normal breath sounds. No wheezing, rhonchi or rales.  Abdominal:     General: Bowel sounds are normal. There is no distension.     Palpations: Abdomen is soft.     Tenderness: There is no right CVA tenderness or left CVA tenderness.  Musculoskeletal:     Cervical back: Neck supple.     Right lower leg: No edema.     Left lower leg: No edema.  Skin:    General: Skin is warm and dry.     Findings: Erythema present.     Comments: Erythema and edema in the right upper chest port area. Sacral area 4 cm in diameter erythema, which the patient stated was from a heat pack  Neurological:     General: No focal deficit present.     Mental Status: She is alert and oriented to person, place, and time.  Psychiatric:        Mood and Affect: Mood normal.        Behavior: Behavior normal. Behavior is cooperative.     Data Reviewed:  Results are pending, will review when available.  EKG: Vent. rate 110 BPM PR interval 132 ms QRS duration 94 ms QT/QTcB 311/421 ms P-R-T axes 31 -8 28 Sinus tachycardia  Assessment and Plan: Principal Problem:   Sepsis (HCC) In the setting of:   MSSA bacteremia  Seems to be coming from Port-A-Cath. Admit to  telemetry/inpatient. Continue IV fluids. Continue cefazolin  2 g  IVPB every 8 hours. Follow-up blood culture and sensitivity Follow CBC and CMP in a.m. Briefly discussed with ID.   - She will be seen during this hospitalization.  Active Problems:   Malignant neoplasm of upper-inner quadrant of  left breast in female, estrogen receptor positive (HCC) Follow-up with Dr. Thelma scheduled.    Class 2 obesity Current BMI 36.61 kg/m. Has had unintentional weight loss. Follow-up closely with PCP and oncology.    Low back pain Analgesics as needed.    Transaminitis Minimal elevation. Follow-up LFTs in AM. Further workup depending on results.    Essential hypertension Continue amlodipine 10 mg p.o. daily.    Mild intermittent asthma  No longer using Advair. No recent use of albuterol  MDI. Continue loratadine 10 mg p.o. daily. Continue montelukast 10 mg p.o. nightly.    Advance Care Planning:   Code Status: Full Code   Consults: Infectious diseases Milan Orem, MD).  Family Communication: Her spouse and daughter were at bedside.  Severity of Illness: The appropriate patient status for this patient is INPATIENT. Inpatient status is judged to be reasonable and necessary in order to provide the required intensity of service to ensure the patient's safety. The patient's presenting symptoms, physical exam findings, and initial radiographic and laboratory data in the context of their chronic comorbidities is felt to place them at high risk for further clinical deterioration. Furthermore, it is not anticipated that the patient will be medically stable for discharge from the hospital within 2 midnights of admission.   * I certify that at the point of admission it is my clinical judgment that the patient will require inpatient hospital care spanning beyond 2 midnights from the point of admission due to high intensity of service, high risk for further deterioration and high  frequency of surveillance required.*  Author: Alm Dorn Castor, MD 09/11/2024 1:56 PM  For on call review [email or web address].   This document was prepared using Dragon voice recognition software and may contain some unintended transcription errors.

## 2024-09-11 NOTE — ED Notes (Signed)
 Report given to [facility], RN at [facility].

## 2024-09-12 DIAGNOSIS — A419 Sepsis, unspecified organism: Secondary | ICD-10-CM | POA: Diagnosis not present

## 2024-09-12 DIAGNOSIS — R7881 Bacteremia: Secondary | ICD-10-CM | POA: Diagnosis not present

## 2024-09-12 DIAGNOSIS — L03313 Cellulitis of chest wall: Secondary | ICD-10-CM

## 2024-09-12 DIAGNOSIS — B9561 Methicillin susceptible Staphylococcus aureus infection as the cause of diseases classified elsewhere: Secondary | ICD-10-CM

## 2024-09-12 DIAGNOSIS — R911 Solitary pulmonary nodule: Secondary | ICD-10-CM

## 2024-09-12 DIAGNOSIS — T80212A Local infection due to central venous catheter, initial encounter: Secondary | ICD-10-CM | POA: Diagnosis not present

## 2024-09-12 DIAGNOSIS — M545 Low back pain, unspecified: Secondary | ICD-10-CM

## 2024-09-12 DIAGNOSIS — D696 Thrombocytopenia, unspecified: Secondary | ICD-10-CM

## 2024-09-12 LAB — CBC WITH DIFFERENTIAL/PLATELET
Abs Immature Granulocytes: 1.42 K/uL — ABNORMAL HIGH (ref 0.00–0.07)
Basophils Absolute: 0 K/uL (ref 0.0–0.1)
Basophils Relative: 0 %
Eosinophils Absolute: 0 K/uL (ref 0.0–0.5)
Eosinophils Relative: 0 %
HCT: 40 % (ref 36.0–46.0)
Hemoglobin: 12.7 g/dL (ref 12.0–15.0)
Immature Granulocytes: 17 %
Lymphocytes Relative: 14 %
Lymphs Abs: 1.1 K/uL (ref 0.7–4.0)
MCH: 28.2 pg (ref 26.0–34.0)
MCHC: 31.8 g/dL (ref 30.0–36.0)
MCV: 88.7 fL (ref 80.0–100.0)
Monocytes Absolute: 0.9 K/uL (ref 0.1–1.0)
Monocytes Relative: 10 %
Neutro Abs: 4.8 K/uL (ref 1.7–7.7)
Neutrophils Relative %: 59 %
Platelets: 144 K/uL — ABNORMAL LOW (ref 150–400)
RBC: 4.51 MIL/uL (ref 3.87–5.11)
RDW: 15.4 % (ref 11.5–15.5)
Smear Review: NORMAL
WBC: 8.2 K/uL (ref 4.0–10.5)
nRBC: 0 % (ref 0.0–0.2)

## 2024-09-12 LAB — COMPREHENSIVE METABOLIC PANEL WITH GFR
ALT: 50 U/L — ABNORMAL HIGH (ref 0–44)
AST: 50 U/L — ABNORMAL HIGH (ref 15–41)
Albumin: 3.1 g/dL — ABNORMAL LOW (ref 3.5–5.0)
Alkaline Phosphatase: 105 U/L (ref 38–126)
Anion gap: 12 (ref 5–15)
BUN: 8 mg/dL (ref 8–23)
CO2: 21 mmol/L — ABNORMAL LOW (ref 22–32)
Calcium: 8.2 mg/dL — ABNORMAL LOW (ref 8.9–10.3)
Chloride: 101 mmol/L (ref 98–111)
Creatinine, Ser: 0.62 mg/dL (ref 0.44–1.00)
GFR, Estimated: >60 mL/min (ref >60)
Glucose, Bld: 99 mg/dL (ref 70–99)
Potassium: 3.4 mmol/L — ABNORMAL LOW (ref 3.5–5.1)
Sodium: 134 mmol/L — ABNORMAL LOW (ref 135–145)
Total Bilirubin: 0.5 mg/dL (ref 0.0–1.2)
Total Protein: 6 g/dL — ABNORMAL LOW (ref 6.5–8.1)

## 2024-09-12 LAB — HIV ANTIBODY (ROUTINE TESTING W REFLEX): HIV Screen 4th Generation wRfx: NONREACTIVE

## 2024-09-12 MED ORDER — ALBUTEROL SULFATE (2.5 MG/3ML) 0.083% IN NEBU
2.5000 mg | INHALATION_SOLUTION | RESPIRATORY_TRACT | Status: DC | PRN
  Administered 2024-09-15 (×2): 2.5 mg via RESPIRATORY_TRACT
  Filled 2024-09-12 (×2): qty 3

## 2024-09-12 MED ORDER — KETOROLAC TROMETHAMINE 30 MG/ML IJ SOLN
30.0000 mg | Freq: Four times a day (QID) | INTRAMUSCULAR | Status: AC
  Administered 2024-09-12 – 2024-09-17 (×20): 30 mg via INTRAVENOUS
  Filled 2024-09-12 (×20): qty 1

## 2024-09-12 NOTE — TOC Initial Note (Signed)
 Transition of Care [facility]) - Initial/Assessment Note    Patient Details  Name: Mary Mendez MRN: [id number] Date of Birth: 1961/04/27  [facility]) CM/SW Contact:    Bascom Service, RN Phone Number: 09/12/2024, 2:22 PM  Clinical Narrative:  Spoke to dtr Jamie-d/c plan home. Has own transport home.                 Expected Discharge Plan: Home/Self Care Barriers to Discharge: Continued Medical Work up   Patient Goals and CMS Choice Patient states their goals for this hospitalization and ongoing recovery are:: Home CMS Medicare.gov Compare Post Acute Care list provided to:: Patient Choice offered to / list presented to : Patient [place] ownership interest in [facility].provided to:: Patient    Expected Discharge Plan and Services   Discharge Planning Services: CM Consult   Living arrangements for the past 2 months: Single Family Home                                      Prior Living Arrangements/Services Living arrangements for the past 2 months: Single Family Home Lives with:: Spouse                   Activities of Daily Living   ADL Screening (condition at time of admission) Independently performs ADLs?: Yes (appropriate for developmental age) Is the patient deaf or have difficulty hearing?: No Does the patient have difficulty seeing, even when wearing glasses/contacts?: No Does the patient have difficulty concentrating, remembering, or making decisions?: No  Permission Sought/Granted                  Emotional Assessment              Admission diagnosis:  Sepsis (HCC) [A41.9] Sepsis without acute organ dysfunction, due to unspecified organism [facility]) [A41.9] Patient Active Problem List   Diagnosis Date Noted   Transaminitis 09/11/2024   Essential hypertension 09/11/2024   Hyperkalemia 09/11/2024   MSSA bacteremia 09/11/2024   Mild intermittent asthma 09/11/2024   Sepsis (HCC) 09/10/2024   Genetic testing  06/19/2024   Malignant neoplasm of upper-inner quadrant of left breast in female, estrogen receptor positive (HCC) 06/09/2024   Class 2 obesity 04/09/2023   Varicose veins of both lower extremities 04/09/2023   Body mass index (BMI) 36.0-36.9, adult 01/19/2023   Constipation 09/07/2022   Nausea and vomiting 05/26/2022   Low back pain 02/15/2022   COVID-19 09/23/2021   Acute upper respiratory infection 01/25/2020   Pleurodynia 08/07/2019   Headache 10/29/2015   Conjunctivitis 06/23/2015   Sciatica, left side 06/23/2015   PCP:  [name], MD Pharmacy:   [facility] [id number] - [address], [place] - [address] [facility] [address] [place] [address] Phone: [phone] Fax: [phone]     Social Drivers of Health (SDOH) Social History: SDOH Screenings   Food Insecurity: No Food Insecurity (09/11/2024)  Housing: Low Risk  (09/11/2024)  Transportation Needs: No Transportation Needs (09/11/2024)  Utilities: Not At Risk (09/11/2024)  Depression (PHQ2-9): Low Risk  (06/11/2024)  Social Connections: Unknown (09/11/2024)  Tobacco Use: Low Risk  (09/11/2024)   SDOH Interventions:     Readmission Risk Interventions     No data to display

## 2024-09-12 NOTE — Progress Notes (Signed)
 PROGRESS NOTE    Mary Mendez  FMW:[id number] DOB: 1961-06-24 DOA: 09/10/2024 PCP: [name], MD   Brief Narrative:  63 year old female with history of left breast cancer, asthma, hyperlipidemia, hypertension, anxiety presented with fever and blood culture growing MSSA.  On presentation, WBC was 4.3, COVID/influenza/RSV PCR negative.  AST of 53 and ALT of 54.  Chest x-ray showed mild central vascular congestion with interstitial edema.  CT L-spine showed mild degenerative changes of the visualized thoracolumbar spine.  CT chest/abdomen/pelvis with contrast showed mild subcutaneous stranding along the right chest port, correlate for cellulitis; new 8 mm right lower lobe pulmonary nodule.  She was started on IV antibiotics.  ID was consulted.  Assessment & Plan:   MSSA bacteremia - Possibly related to Port-A-Cath infection.  Continue IV cefazolin .  Follow ID recommendations.  Left breast cancer -Outpatient follow-up with oncology  Essential hypertension Hyperlipidemia - Continue amlodipine and statin  Hyponatremia - Mild.  Labs pending today.  Monitor  Hyperkalemia - Mild.  Labs pending today.  Monitor  Thrombocytopenia - Mild.  No signs of bleeding.  Monitor intermittently  Mildly elevated LFTs - AST and ALT mildly elevated.  Questionable cause.  Labs pending for today.  Monitor  Obesity class II - Outpatient follow-up  Mild intermittent asthma - Continue loratadine and montelukast.  No longer using Advair   DVT prophylaxis: Lovenox Code Status: Full Family Communication: Sister at bedside Disposition Plan: Status is: Inpatient Remains inpatient appropriate because: Of severity of illness    Consultants: ID  Procedures: None  Antimicrobials: Cefazolin  from 09/11/2024 onwards   Subjective: Patient seen and examined at bedside.  No fever, vomiting, chest pain or shortness of breath reported.  Complains of some wheezing.  Objective: Vitals:    09/11/24 2307 09/11/24 2335 09/12/24 0312 09/12/24 0600  BP: (!) 191/97 (!) 144/93 (!) 120/92   Pulse: (!) 119 (!) 107 85   Resp:  18    Temp: 99.1 F (37.3 C) 99.1 F (37.3 C) (!) 97.4 F (36.3 C) 98 F (36.7 C)  TempSrc: Oral Oral Oral Oral  SpO2: 98% 99% 97%   Weight:      Height:        Intake/Output Summary (Last 24 hours) at 09/12/2024 9361 Last data filed at 09/12/2024 9495 Gross per 24 hour  Intake 3019.23 ml  Output --  Net 3019.23 ml   Filed Weights   09/11/24 1254  Weight: 99.8 kg    Examination:  General exam: Appears calm and comfortable  Respiratory system: Bilateral decreased breath sounds at bases with intermittent tachypnea and intermittent wheezing Cardiovascular system: S1 & S2 heard, mild intermittent tachycardia present  gastrointestinal system: Abdomen is obese, nondistended, soft and nontender. Normal bowel sounds heard. Extremities: No cyanosis, clubbing, edema  Central nervous system: Alert and oriented. No focal neurological deficits. Moving extremities Skin: No rashes, lesions or ulcers Psychiatry: Judgement and insight appear normal. Mood & affect appropriate.     Data Reviewed: I have personally reviewed following labs and imaging studies  CBC: Recent Labs  Lab 09/10/24 1855 09/12/24 0604  WBC 4.3 8.2  NEUTROABS 2.4 PENDING  HGB 13.2 12.7  HCT 39.2 40.0  MCV 84.1 88.7  PLT 196 144*   Basic Metabolic Panel: Recent Labs  Lab 09/10/24 1855  NA 132*  K 5.5*  CL 97*  CO2 23  GLUCOSE 138*  BUN 12  CREATININE 0.67  CALCIUM 8.8*   GFR: Estimated Creatinine Clearance: 86.4 mL/min (by C-G formula  based on SCr of 0.67 mg/dL). Liver Function Tests: Recent Labs  Lab 09/10/24 1855  AST 53*  ALT 54*  ALKPHOS 109  BILITOT 0.7  PROT 6.5  ALBUMIN 3.8   No results for input(s): LIPASE, AMYLASE in the last 168 hours. No results for input(s): AMMONIA in the last 168 hours. Coagulation Profile: Recent Labs  Lab  09/10/24 1945  INR 0.9   Cardiac Enzymes: No results for input(s): CKTOTAL, CKMB, CKMBINDEX, TROPONINI in the last 168 hours. BNP (last 3 results) No results for input(s): PROBNP in the last 8760 hours. HbA1C: No results for input(s): HGBA1C in the last 72 hours. CBG: No results for input(s): GLUCAP in the last 168 hours. Lipid Profile: No results for input(s): CHOL, HDL, LDLCALC, TRIG, CHOLHDL, LDLDIRECT in the last 72 hours. Thyroid Function Tests: No results for input(s): TSH, T4TOTAL, FREET4, T3FREE, THYROIDAB in the last 72 hours. Anemia Panel: No results for input(s): VITAMINB12, FOLATE, FERRITIN, TIBC, IRON, RETICCTPCT in the last 72 hours. Sepsis Labs: Recent Labs  Lab 09/10/24 1855  LATICACIDVEN 1.5    Recent Results (from the past 240 hours)  Culture, blood (Routine x 2)     Status: None (Preliminary result)   Collection Time: 09/10/24  6:49 PM   Specimen: BLOOD  Result Value Ref Range Status   Specimen Description   Final    BLOOD RIGHT ANTECUBITAL Performed at [facility], [address] [place], [place] [address]    Special Requests   Final    Blood Culture adequate volume BOTTLES DRAWN AEROBIC AND ANAEROBIC Performed at [facility], [address], [place], [place] [address]    Culture  Setup Time   Final    GRAM POSITIVE COCCI IN CLUSTERS IN BOTH AEROBIC AND ANAEROBIC BOTTLES CRITICAL RESULT CALLED TO, READ BACK BY AND VERIFIED WITH: PHARMD CRYSTAL ROBERTSON ON 09/11/24 @ 1320 BY DRT Performed at [facility], [address] [place], [place] [address]    Culture GRAM POSITIVE COCCI  Final   Report Status PENDING  Incomplete  Blood Culture ID Panel (Reflexed)     Status: Abnormal   Collection Time: 09/10/24  6:49 PM  Result Value Ref Range Status   Enterococcus faecalis NOT DETECTED NOT DETECTED Final   Enterococcus Faecium NOT DETECTED NOT DETECTED Final    Listeria monocytogenes NOT DETECTED NOT DETECTED Final   Staphylococcus species DETECTED (A) NOT DETECTED Final    Comment: CRITICAL RESULT CALLED TO, READ BACK BY AND VERIFIED WITH: PHARMD CRYSTAL ROBERTSON ON 09/11/24 @ 1320 BY DRT    Staphylococcus aureus (BCID) DETECTED (A) NOT DETECTED Final    Comment: CRITICAL RESULT CALLED TO, READ BACK BY AND VERIFIED WITH: PHARMD CRYSTAL ROBERTSON ON 09/11/24 @ 1320 BY DRT    Staphylococcus epidermidis NOT DETECTED NOT DETECTED Final   Staphylococcus lugdunensis NOT DETECTED NOT DETECTED Final   Streptococcus species NOT DETECTED NOT DETECTED Final   Streptococcus agalactiae NOT DETECTED NOT DETECTED Final   Streptococcus pneumoniae NOT DETECTED NOT DETECTED Final   Streptococcus pyogenes NOT DETECTED NOT DETECTED Final   A.calcoaceticus-baumannii NOT DETECTED NOT DETECTED Final   Bacteroides fragilis NOT DETECTED NOT DETECTED Final   Enterobacterales NOT DETECTED NOT DETECTED Final   Enterobacter cloacae complex NOT DETECTED NOT DETECTED Final   Escherichia coli NOT DETECTED NOT DETECTED Final   Klebsiella aerogenes NOT DETECTED NOT DETECTED Final   Klebsiella oxytoca NOT DETECTED NOT DETECTED Final   Klebsiella pneumoniae NOT DETECTED NOT DETECTED Final  Proteus species NOT DETECTED NOT DETECTED Final   Salmonella species NOT DETECTED NOT DETECTED Final   Serratia marcescens NOT DETECTED NOT DETECTED Final   Haemophilus influenzae NOT DETECTED NOT DETECTED Final   Neisseria meningitidis NOT DETECTED NOT DETECTED Final   Pseudomonas aeruginosa NOT DETECTED NOT DETECTED Final   Stenotrophomonas maltophilia NOT DETECTED NOT DETECTED Final   Candida albicans NOT DETECTED NOT DETECTED Final   Candida auris NOT DETECTED NOT DETECTED Final   Candida glabrata NOT DETECTED NOT DETECTED Final   Candida krusei NOT DETECTED NOT DETECTED Final   Candida parapsilosis NOT DETECTED NOT DETECTED Final   Candida tropicalis NOT DETECTED NOT DETECTED  Final   Cryptococcus neoformans/gattii NOT DETECTED NOT DETECTED Final   Meth resistant mecA/C and MREJ NOT DETECTED NOT DETECTED Final    Comment: Performed at [facility], [address] [place], [place] [address]  Resp panel by RT-PCR (RSV, Flu A&B, Covid) Anterior Nasal Swab     Status: None   Collection Time: 09/10/24  8:36 PM   Specimen: Anterior Nasal Swab  Result Value Ref Range Status   SARS Coronavirus 2 by RT PCR NEGATIVE NEGATIVE Final    Comment: (NOTE) SARS-CoV-2 target nucleic acids are NOT DETECTED.  The SARS-CoV-2 RNA is generally detectable in upper respiratory specimens during the acute phase of infection. The lowest concentration of SARS-CoV-2 viral copies this assay can detect is 138 copies/mL. A negative result does not preclude SARS-Cov-2 infection and should not be used as the sole basis for treatment or other patient management decisions. A negative result may occur with  improper specimen collection/handling, submission of specimen other than nasopharyngeal swab, presence of viral mutation(s) within the areas targeted by this assay, and inadequate number of viral copies(<138 copies/mL). A negative result must be combined with clinical observations, patient history, and epidemiological information. The expected result is Negative.  Fact Sheet for Patients:  bloggercourse.com  Fact Sheet for Healthcare Providers:  seriousbroker.it  This test is no t yet approved or cleared by the [place]  FDA and  has been authorized for detection and/or diagnosis of SARS-CoV-2 by FDA under an Emergency Use Authorization (EUA). This EUA will remain  in effect (meaning this test can be used) for the duration of the COVID-19 declaration under Section 564(b)(1) of the Act, 21 U.S.C.section 360bbb-3(b)(1), unless the authorization is terminated  or revoked sooner.       Influenza A by PCR NEGATIVE NEGATIVE Final    Influenza B by PCR NEGATIVE NEGATIVE Final    Comment: (NOTE) The Xpert Xpress SARS-CoV-2/FLU/RSV plus assay is intended as an aid in the diagnosis of influenza from Nasopharyngeal swab specimens and should not be used as a sole basis for treatment. Nasal washings and aspirates are unacceptable for Xpert Xpress SARS-CoV-2/FLU/RSV testing.  Fact Sheet for Patients: bloggercourse.com  Fact Sheet for Healthcare Providers: seriousbroker.it  This test is not yet approved or cleared by the [place]  FDA and has been authorized for detection and/or diagnosis of SARS-CoV-2 by FDA under an Emergency Use Authorization (EUA). This EUA will remain in effect (meaning this test can be used) for the duration of the COVID-19 declaration under Section 564(b)(1) of the Act, 21 U.S.C. section 360bbb-3(b)(1), unless the authorization is terminated or revoked.     Resp Syncytial Virus by PCR NEGATIVE NEGATIVE Final    Comment: (NOTE) Fact Sheet for Patients: bloggercourse.com  Fact Sheet for Healthcare Providers: seriousbroker.it  This test is not yet approved or cleared  by the [place]  FDA and has been authorized for detection and/or diagnosis of SARS-CoV-2 by FDA under an Emergency Use Authorization (EUA). This EUA will remain in effect (meaning this test can be used) for the duration of the COVID-19 declaration under Section 564(b)(1) of the Act, 21 U.S.C. section 360bbb-3(b)(1), unless the authorization is terminated or revoked.  Performed at [facility], [address] [place], [place] [address]          Radiology Studies: CT L-SPINE NO CHARGE Result Date: 09/10/2024 EXAM: CT OF THE LUMBAR SPINE WITHOUT CONTRAST 09/10/2024 09:27:51 PM TECHNIQUE: CT of the lumbar spine was performed without the administration of intravenous contrast. Multiplanar  reformatted images are provided for review. Automated exposure control, iterative reconstruction, and/or weight based adjustment of the mA/kV was utilized to reduce the radiation dose to as low as reasonably achievable. COMPARISON: None available. CLINICAL HISTORY: FINDINGS: BONES AND ALIGNMENT: Normal vertebral body heights. No acute fracture or suspicious bone lesion. Normal alignment. DEGENERATIVE CHANGES: Mild degenerative changes of the visualized thoracolumbar spine. SOFT TISSUES: No acute abnormality. IMPRESSION: 1. Mild degenerative changes of the visualized thoracolumbar spine. Electronically signed by: Pinkie Pebbles MD 09/10/2024 09:35 PM EST RP Workstation: [id number]   CT CHEST ABDOMEN PELVIS W CONTRAST Result Date: 09/10/2024 EXAM: CT CHEST, ABDOMEN AND PELVIS WITH CONTRAST 09/10/2024 09:27:51 PM TECHNIQUE: CT of the chest, abdomen and pelvis was performed with the administration of 100 mL of iohexol (OMNIPAQUE) 300 MG/ML solution. Multiplanar reformatted images are provided for review. Automated exposure control, iterative reconstruction, and/or weight based adjustment of the mA/kV was utilized to reduce the radiation dose to as low as reasonably achievable. COMPARISON: CT abdomen and pelvis dated 10/10/2022 and CT chest dated 04/27/2022. CLINICAL HISTORY: Fever of unknown origin (Ped > 27mo); fever, port with swelling. FINDINGS: CHEST: MEDIASTINUM AND LYMPH NODES: Heart and pericardium are unremarkable. The central airways are clear. No mediastinal, hilar or axillary lymphadenopathy. Right chest port terminates in the lower SVC. LUNGS AND PLEURA: 8 mm right lower lobe nodule (image 63), new from prior. Follow-up CT chest or PET CT is suggested in 3 months. Mild left basilar scarring/atelectasis. No pleural effusion or pneumothorax. ABDOMEN AND PELVIS: LIVER: The liver is unremarkable. GALLBLADDER AND BILE DUCTS: Status post cholecystectomy. No biliary ductal dilatation. SPLEEN: No acute  abnormality. PANCREAS: No acute abnormality. ADRENAL GLANDS: No acute abnormality. KIDNEYS, URETERS AND BLADDER: Scattered subcentimeter right renal cysts, benign (Bosniak I). No follow-up is recommended. No stones in the kidneys or ureters. No hydronephrosis. No perinephric or periureteral stranding. Urinary bladder is unremarkable. GI AND BOWEL: Stomach demonstrates no acute abnormality. Mild sigmoid diverticulosis, without evidence of diverticulitis. There is no bowel obstruction. REPRODUCTIVE ORGANS: Status post hysterectomy. PERITONEUM AND RETROPERITONEUM: No ascites. No free air. VASCULATURE: Aorta is normal in caliber. ABDOMINAL AND PELVIS LYMPH NODES: No lymphadenopathy. BONES AND SOFT TISSUES: Degenerative changes of the visualized thoracolumbar spine. Status post left breast lumpectomy and bilateral mammoplasties. Surgical clips in the left axilla. No acute osseous abnormality. Mild subcutaneous stranding along the patient's right chest port (images 4 and 7), correlate for cellulitis . IMPRESSION: 1. Mild subcutaneous standing along the right chest port, correlate for cellulitis. 2. New 8 mm solid right lower lobe pulmonary nodule, indeterminate. Follow-up CT chest or PET-CT is suggested in 3 months. Electronically signed by: Pinkie Pebbles MD 09/10/2024 09:34 PM EST RP Workstation: [id number]   DG Chest Port 1 View if patient is in a treatment room. Result Date: 09/10/2024 EXAM: 1 VIEW(S) XRAY  OF THE CHEST 09/10/2024 07:00:00 PM COMPARISON: 08/18/2024 CLINICAL HISTORY: Possible sepsis and back pain. FINDINGS: LUNGS AND PLEURA: Mild central vascular congestion is noted without interstitial edema. No focal infiltrate is seen. No pleural effusion. No pneumothorax. HEART AND MEDIASTINUM: The cardiac shadow is within normal limits. BONES AND SOFT TISSUES: No bony abnormality is noted. IMPRESSION: 1. Mild central vascular congestion without interstitial edema. Electronically signed by: Oneil Devonshire MD  09/10/2024 07:05 PM EST RP Workstation: [id number]        Scheduled Meds:  amLODipine  10 mg Oral Daily   enoxaparin (LOVENOX) injection  50 mg Subcutaneous Q24H   loratadine  10 mg Oral Daily   montelukast  10 mg Oral QHS   rosuvastatin  10 mg Oral Daily   Continuous Infusions:  sodium chloride  125 mL/hr at 09/12/24 0504    ceFAZolin  (ANCEF ) IV 2 g (09/12/24 0612)          Sophie Mao, MD [facility] 09/12/2024, 6:38 AM

## 2024-09-12 NOTE — Consult Note (Signed)
 [facility]                                                                                        Patient Identification: Patient Name: Mary Mendez MRN: [id number] Admit Date: 09/10/2024  6:17 PM Today's Date: 09/12/2024 Reason for consult: Staph bacteremia Requesting provider: Sharyon auto consult  Principal Problem:   Sepsis [facility]) Active Problems:   Malignant neoplasm of upper-inner quadrant of left breast in female, estrogen receptor positive (HCC)   Class 2 obesity   Low back pain   Transaminitis   Essential hypertension   Hyperkalemia   MSSA bacteremia   Mild intermittent asthma   Antibiotics:  IV cefazolin  11/13- Total days of antibiotics 3  Lines/Hardware: Right chest port  Assessment # MSSA bacteremia 2/2 below # Infected portacath with associated cellulitis  # Acute onset lower back pain r/o discitis/osteomyelitis  # Elevated liver enzymes 2/2 above - improving  # Thrombocytopenia 2/2 above - improving  # New 8 mm solid right lower lobe pulmonary nodule - concerning in the setting of breast ca and defer to primary team   Recommendations  - Continue IV cefazolin  - 2 sets of repeat blood cultures ordered for 11/15 - TTE ordered.  If negative needs TEE - I will also order an MRI of lumbar spine with and without to rule out discitis/osteomyelitis - Monitor CBC and BMP on antibiotics - Monitor for metastatic sites of infection - Universal/standard isolation precaution Following peripherally this weekend.  New ID team to start on Monday.  Rest of the management as per the primary team. Please call with questions or concerns.  Thank you for the consult  __________________________________________________________________________________________________________ HPI and Hospital Course: 63 year old female with prior history as below including left breast CA s/p surgery  and on chemotherapy started on 11/6 ( cyclophosphamide, docetaxel) , asthma, HTN, HLD presented to the ED on 11/12 with fever that started on day of ED arrival along with acutre onset low back pain. She had noted her port site was very swollen and tender, upper extremity ultrasound was negative for DVT on 11/10.  Denies radiation of back pain down to the legs or bowel or bladder incontinence.  Denies nausea, vomiting or diarrhea or chest pain or shortness of breath  or GU symptoms except some increased urinary frequency and some dry cough. Denies any peripheral joint pain, swelling of hardware +.   At ED febrile, tachycardic Labs remarkable for NA 132, K5.5, BG 138, AST 53, ALT 54 Influenza A/influenza B/RSV/SARS-CoV-2 negative  UA trace hemoglobin, protein 30 Started on vancomycin, cefepime and metronidazole, acetaminophen  1 g p.o. once  CT CAP  1. Mild subcutaneous standing along the right chest port, correlate for cellulitis. 2. New 8 mm solid right lower lobe pulmonary nodule, indeterminate. Follow-up CT chest or PET-CT is suggested in 3 months.   ROS: General- Denies chills, loss of appetite and loss of weight HEENT - Denies headache, blurry vision, neck pain, sinus pain Chest - Denies any chest pain, SOB or cough CVS- Denies any dizziness/lightheadedness, syncopal attacks, palpitations Abdomen- Denies any nausea, vomiting, abdominal pain, hematochezia and diarrhea  Neuro - Denies any weakness, numbness, tingling sensation Psych - Denies any changes in mood irritability or depressive symptoms GU- Denies any burning, dysuria, hematuria or increased frequency of urination Skin - denies any rashes/lesions MSK - denies any joint pain/swelling or restricted ROM   Past Medical History:  Diagnosis Date   Anxiety    Asthma    Bronchitis    Cancer (HCC) 06/2024   Left breast IDC   COVID-19 08/2021   Essential hypertension 09/11/2024   Hyperlipidemia    Hypertension    Mild  intermittent asthma 09/11/2024   Pneumonia    Past Surgical History:  Procedure Laterality Date   ABDOMINAL HYSTERECTOMY  2010   and left ovaries;   BREAST BIOPSY     BREAST BIOPSY Left 06/04/2024   MM LT BREAST BX W LOC DEV 1ST LESION IMAGE BX SPEC STEREO GUIDE 06/04/2024 [facility]   BREAST BIOPSY Left 06/04/2024   MM LT BREAST BX W LOC DEV EA AD LESION IMG BX SPEC STEREO GUIDE 06/04/2024 [facility]   BREAST BIOPSY  07/15/2024   MM LT RADIOACTIVE SEED LOC MAMMO GUIDE 07/15/2024 [facility]   BREAST BIOPSY  07/15/2024   MM LT RADIOACTIVE SEED EA ADD LESION LOC MAMMO GUIDE 07/15/2024 [facility]   BREAST BIOPSY  07/15/2024   MM LT RADIOACTIVE SEED EA ADD LESION LOC MAMMO GUIDE 07/15/2024 [facility]   BREAST BIOPSY  07/15/2024   MM LT RADIOACTIVE SEED EA ADD LESION LOC MAMMO GUIDE 07/15/2024 [facility]   BREAST EXCISIONAL BIOPSY Left    BREAST LUMPECTOMY WITH RADIOACTIVE SEED AND SENTINEL LYMPH NODE BIOPSY Left 07/17/2024   Procedure: BREAST LUMPECTOMY WITH RADIOACTIVE SEED AND SENTINEL LYMPH NODE BIOPSY;  Surgeon: Vernetta Berg, MD;  Location: [facility];  Service: General;  Laterality: Left;  RADIOACTIVE SEED GUIDED LEFT BREAST BRACKETED LUMPECTOMY AND SENTINEL NODE BIOPSY   BREAST REDUCTION SURGERY Bilateral 07/25/2024   Procedure: MAMMOPLASTY, REDUCTION;  Surgeon: Arelia Filippo, MD;  Location: [facility];  Service: Plastics;  Laterality: Bilateral;   BREAST SURGERY     CHOLECYSTECTOMY N/A 02/18/2022   Procedure: LAPAROSCOPIC CHOLECYSTECTOMY;  Surgeon: Signe Mitzie LABOR, MD;  Location: [facility];  Service: General;  Laterality: N/A;   EXCISION, MASS, UPPER EXTREMITY Left 07/17/2024   Procedure: EXCISION, MASS, UPPER EXTREMITY;  Surgeon: Vernetta Berg, MD;  Location: [facility];  Service: General;  Laterality: Left;  EXCISION LEFT SHOULDER MASS   HYSTEROTOMY     PORTACATH PLACEMENT N/A 08/18/2024   Procedure: INSERTION, TUNNELED CENTRAL  VENOUS DEVICE, WITH PORT;  Surgeon: Vernetta Berg, MD;  Location: [facility];  Service: General;  Laterality: N/A;  PORT PLACEMENT WITH ULTRASOUND GUIDANCE  '  Scheduled Meds:  amLODipine  10 mg Oral Daily   enoxaparin (LOVENOX) injection  50 mg Subcutaneous Q24H   loratadine  10 mg Oral Daily   montelukast  10 mg Oral QHS   rosuvastatin  10 mg Oral Daily   Continuous Infusions:  sodium chloride  125 mL/hr at 09/12/24 0504    ceFAZolin  (ANCEF ) IV 2 g (09/12/24 0612)   PRN Meds:.acetaminophen  **OR** acetaminophen , albuterol , HYDROmorphone  (DILAUDID ) injection, ondansetron  **OR** ondansetron  (ZOFRAN ) IV, oxyCODONE , prochlorperazine   No Known Allergies  Social History   Socioeconomic History   Marital status: Married    Spouse name: Not on file   Number of children: 2   Years of education: Not on file   Highest education level: Not on file  Occupational History   Occupation: Self  Employed  Tobacco Use   Smoking status: Never    Passive exposure: Never   Smokeless tobacco: Never  Vaping Use   Vaping status: Never Used  Substance and Sexual Activity   Alcohol use: Yes    Comment: social   Drug use: Never   Sexual activity: Not Currently    Birth control/protection: Surgical    Comment: hyst  Other Topics Concern   Not on file  Social History Narrative   Not on file   Social Drivers of Health   Financial Resource Strain: Not on file  Food Insecurity: No Food Insecurity (09/11/2024)   Hunger Vital Sign    Worried About Running Out of Food in the Last Year: Never true    Ran Out of Food in the Last Year: Never true  Transportation Needs: No Transportation Needs (09/11/2024)   PRAPARE - Administrator, Civil Service (Medical): No    Lack of Transportation (Non-Medical): No  Physical Activity: Not on file  Stress: Not on file  Social Connections: Unknown (09/11/2024)   Social Connection and Isolation Panel    Frequency of Communication  with Friends and Family: More than three times a week    Frequency of Social Gatherings with Friends and Family: More than three times a week    Attends Religious Services: Patient declined    Database Administrator or Organizations: Patient declined    Attends Banker Meetings: Patient declined    Marital Status: Married  Catering Manager Violence: Not At Risk (09/11/2024)   Humiliation, Afraid, Rape, and Kick questionnaire    Fear of Current or Ex-Partner: No    Emotionally Abused: No    Physically Abused: No    Sexually Abused: No   Family History  Problem Relation Age of Onset   Breast cancer Mother 44   Emphysema Father    Stroke Father    Stroke Sister    Asthma Sister    Leukemia Maternal Aunt 13 - 79   Asthma Paternal Uncle    Colon cancer Neg Hx    Colon polyps Neg Hx    Esophageal cancer Neg Hx    Rectal cancer Neg Hx    Stomach cancer Neg Hx    Vitals BP 136/75   Pulse 97   Temp 100.1 F (37.8 C) (Oral)   Resp 20   Ht 5' 5 (1.651 m)   Wt 99.8 kg   SpO2 96%   BMI 36.61 kg/m     Physical Exam Constitutional: Adult female lying in the bed, nontoxic-appearing    Comments: HEENT WNL  Cardiovascular:     Rate and Rhythm: Normal rate      Heart sounds: S1 and S2  Pulmonary:     Effort: Pulmonary effort is normal.     Comments normal breath sounds  Abdominal:     Palpations: Abdomen is soft.     Tenderness: Nondistended and nontender  Musculoskeletal:        General: No swelling or tenderness in peripheral joints  Skin:    Comments no rashes, mild tenderness and swelling at the right Port-A-Cath site, no fluctuance or crepitus  Neurological:     General: Awake, alert and oriented, grossly nonfocal  Psychiatric:        Mood and Affect: Mood normal.    Pertinent Microbiology Results for orders placed or performed during the hospital encounter of 09/10/24  Culture, blood (Routine x 2)     Status: None (  Preliminary result)    Collection Time: 09/10/24  6:49 PM   Specimen: BLOOD  Result Value Ref Range Status   Specimen Description   Final    BLOOD RIGHT ANTECUBITAL Performed at [facility], [address], [place], [place] [address]    Special Requests   Final    Blood Culture adequate volume BOTTLES DRAWN AEROBIC AND ANAEROBIC Performed at [facility], [address], [place], [place] [address]    Culture  Setup Time   Final    GRAM POSITIVE COCCI IN CLUSTERS IN BOTH AEROBIC AND ANAEROBIC BOTTLES CRITICAL RESULT CALLED TO, READ BACK BY AND VERIFIED WITH: PHARMD CRYSTAL ROBERTSON ON 09/11/24 @ 1320 BY DRT Performed at [facility], [address] [place], [place] [address]    Culture GRAM POSITIVE COCCI  Final   Report Status PENDING  Incomplete  Blood Culture ID Panel (Reflexed)     Status: Abnormal   Collection Time: 09/10/24  6:49 PM  Result Value Ref Range Status   Enterococcus faecalis NOT DETECTED NOT DETECTED Final   Enterococcus Faecium NOT DETECTED NOT DETECTED Final   Listeria monocytogenes NOT DETECTED NOT DETECTED Final   Staphylococcus species DETECTED (A) NOT DETECTED Final    Comment: CRITICAL RESULT CALLED TO, READ BACK BY AND VERIFIED WITH: PHARMD CRYSTAL ROBERTSON ON 09/11/24 @ 1320 BY DRT    Staphylococcus aureus (BCID) DETECTED (A) NOT DETECTED Final    Comment: CRITICAL RESULT CALLED TO, READ BACK BY AND VERIFIED WITH: PHARMD CRYSTAL ROBERTSON ON 09/11/24 @ 1320 BY DRT    Staphylococcus epidermidis NOT DETECTED NOT DETECTED Final   Staphylococcus lugdunensis NOT DETECTED NOT DETECTED Final   Streptococcus species NOT DETECTED NOT DETECTED Final   Streptococcus agalactiae NOT DETECTED NOT DETECTED Final   Streptococcus pneumoniae NOT DETECTED NOT DETECTED Final   Streptococcus pyogenes NOT DETECTED NOT DETECTED Final   A.calcoaceticus-baumannii NOT DETECTED NOT DETECTED Final   Bacteroides fragilis NOT DETECTED NOT DETECTED Final    Enterobacterales NOT DETECTED NOT DETECTED Final   Enterobacter cloacae complex NOT DETECTED NOT DETECTED Final   Escherichia coli NOT DETECTED NOT DETECTED Final   Klebsiella aerogenes NOT DETECTED NOT DETECTED Final   Klebsiella oxytoca NOT DETECTED NOT DETECTED Final   Klebsiella pneumoniae NOT DETECTED NOT DETECTED Final   Proteus species NOT DETECTED NOT DETECTED Final   Salmonella species NOT DETECTED NOT DETECTED Final   Serratia marcescens NOT DETECTED NOT DETECTED Final   Haemophilus influenzae NOT DETECTED NOT DETECTED Final   Neisseria meningitidis NOT DETECTED NOT DETECTED Final   Pseudomonas aeruginosa NOT DETECTED NOT DETECTED Final   Stenotrophomonas maltophilia NOT DETECTED NOT DETECTED Final   Candida albicans NOT DETECTED NOT DETECTED Final   Candida auris NOT DETECTED NOT DETECTED Final   Candida glabrata NOT DETECTED NOT DETECTED Final   Candida krusei NOT DETECTED NOT DETECTED Final   Candida parapsilosis NOT DETECTED NOT DETECTED Final   Candida tropicalis NOT DETECTED NOT DETECTED Final   Cryptococcus neoformans/gattii NOT DETECTED NOT DETECTED Final   Meth resistant mecA/C and MREJ NOT DETECTED NOT DETECTED Final    Comment: Performed at [facility], [address], [place], [place] [address]  Resp panel by RT-PCR (RSV, Flu A&B, Covid) Anterior Nasal Swab     Status: None   Collection Time: 09/10/24  8:36 PM   Specimen: Anterior Nasal Swab  Result Value Ref Range Status   SARS Coronavirus 2 by RT PCR NEGATIVE NEGATIVE Final  Comment: (NOTE) SARS-CoV-2 target nucleic acids are NOT DETECTED.  The SARS-CoV-2 RNA is generally detectable in upper respiratory specimens during the acute phase of infection. The lowest concentration of SARS-CoV-2 viral copies this assay can detect is 138 copies/mL. A negative result does not preclude SARS-Cov-2 infection and should not be used as the sole basis for treatment or other patient management decisions. A  negative result may occur with  improper specimen collection/handling, submission of specimen other than nasopharyngeal swab, presence of viral mutation(s) within the areas targeted by this assay, and inadequate number of viral copies(<138 copies/mL). A negative result must be combined with clinical observations, patient history, and epidemiological information. The expected result is Negative.  Fact Sheet for Patients:  bloggercourse.com  Fact Sheet for Healthcare Providers:  seriousbroker.it  This test is no t yet approved or cleared by the [place]  FDA and  has been authorized for detection and/or diagnosis of SARS-CoV-2 by FDA under an Emergency Use Authorization (EUA). This EUA will remain  in effect (meaning this test can be used) for the duration of the COVID-19 declaration under Section 564(b)(1) of the Act, 21 U.S.C.section 360bbb-3(b)(1), unless the authorization is terminated  or revoked sooner.       Influenza A by PCR NEGATIVE NEGATIVE Final   Influenza B by PCR NEGATIVE NEGATIVE Final    Comment: (NOTE) The Xpert Xpress SARS-CoV-2/FLU/RSV plus assay is intended as an aid in the diagnosis of influenza from Nasopharyngeal swab specimens and should not be used as a sole basis for treatment. Nasal washings and aspirates are unacceptable for Xpert Xpress SARS-CoV-2/FLU/RSV testing.  Fact Sheet for Patients: bloggercourse.com  Fact Sheet for Healthcare Providers: seriousbroker.it  This test is not yet approved or cleared by the [place]  FDA and has been authorized for detection and/or diagnosis of SARS-CoV-2 by FDA under an Emergency Use Authorization (EUA). This EUA will remain in effect (meaning this test can be used) for the duration of the COVID-19 declaration under Section 564(b)(1) of the Act, 21 U.S.C. section 360bbb-3(b)(1), unless the authorization  is terminated or revoked.     Resp Syncytial Virus by PCR NEGATIVE NEGATIVE Final    Comment: (NOTE) Fact Sheet for Patients: bloggercourse.com  Fact Sheet for Healthcare Providers: seriousbroker.it  This test is not yet approved or cleared by the [place]  FDA and has been authorized for detection and/or diagnosis of SARS-CoV-2 by FDA under an Emergency Use Authorization (EUA). This EUA will remain in effect (meaning this test can be used) for the duration of the COVID-19 declaration under Section 564(b)(1) of the Act, 21 U.S.C. section 360bbb-3(b)(1), unless the authorization is terminated or revoked.  Performed at [facility], [address], [place], [place] [address]    Pertinent Lab seen by me:    Latest Ref Rng & Units 09/12/2024    6:04 AM 09/10/2024    6:55 PM 09/03/2024    3:25 PM  CBC  WBC 4.0 - 10.5 K/uL 8.2  4.3  10.1   Hemoglobin 12.0 - 15.0 g/dL 87.2  86.7  86.3   Hematocrit 36.0 - 46.0 % 40.0  39.2  40.0   Platelets 150 - 400 K/uL 144  196  275       Latest Ref Rng & Units 09/12/2024    6:04 AM 09/10/2024    6:55 PM 09/03/2024    3:25 PM  CMP  Glucose 70 - 99 mg/dL 99  861  91   BUN 8 - 23 mg/dL 8  12  17   Creatinine 0.44 - 1.00 mg/dL 9.37  9.32  9.17   Sodium 135 - 145 mmol/L 134  132  138   Potassium 3.5 - 5.1 mmol/L 3.4  5.5  3.9   Chloride 98 - 111 mmol/L 101  97  105   CO2 22 - 32 mmol/L 21  23  24    Calcium 8.9 - 10.3 mg/dL 8.2  8.8  8.9   Total Protein 6.5 - 8.1 g/dL 6.0  6.5  7.2   Total Bilirubin 0.0 - 1.2 mg/dL 0.5  0.7  0.4   Alkaline Phos 38 - 126 U/L 105  109  74   AST 15 - 41 U/L 50  53  14   ALT 0 - 44 U/L 50  54  12     Pertinent Imagings/Other Imagings Plain films and CT images have been personally visualized and interpreted; radiology reports have been reviewed. Decision making incorporated into the Impression / Recommendations.  CT L-SPINE NO  CHARGE Result Date: 09/10/2024 EXAM: CT OF THE LUMBAR SPINE WITHOUT CONTRAST 09/10/2024 09:27:51 PM TECHNIQUE: CT of the lumbar spine was performed without the administration of intravenous contrast. Multiplanar reformatted images are provided for review. Automated exposure control, iterative reconstruction, and/or weight based adjustment of the mA/kV was utilized to reduce the radiation dose to as low as reasonably achievable. COMPARISON: None available. CLINICAL HISTORY: FINDINGS: BONES AND ALIGNMENT: Normal vertebral body heights. No acute fracture or suspicious bone lesion. Normal alignment. DEGENERATIVE CHANGES: Mild degenerative changes of the visualized thoracolumbar spine. SOFT TISSUES: No acute abnormality. IMPRESSION: 1. Mild degenerative changes of the visualized thoracolumbar spine. Electronically signed by: Pinkie Pebbles MD 09/10/2024 09:35 PM EST RP Workstation: [id number]   CT CHEST ABDOMEN PELVIS W CONTRAST Result Date: 09/10/2024 EXAM: CT CHEST, ABDOMEN AND PELVIS WITH CONTRAST 09/10/2024 09:27:51 PM TECHNIQUE: CT of the chest, abdomen and pelvis was performed with the administration of 100 mL of iohexol (OMNIPAQUE) 300 MG/ML solution. Multiplanar reformatted images are provided for review. Automated exposure control, iterative reconstruction, and/or weight based adjustment of the mA/kV was utilized to reduce the radiation dose to as low as reasonably achievable. COMPARISON: CT abdomen and pelvis dated 10/10/2022 and CT chest dated 04/27/2022. CLINICAL HISTORY: Fever of unknown origin (Ped > 7mo); fever, port with swelling. FINDINGS: CHEST: MEDIASTINUM AND LYMPH NODES: Heart and pericardium are unremarkable. The central airways are clear. No mediastinal, hilar or axillary lymphadenopathy. Right chest port terminates in the lower SVC. LUNGS AND PLEURA: 8 mm right lower lobe nodule (image 63), new from prior. Follow-up CT chest or PET CT is suggested in 3 months. Mild left basilar  scarring/atelectasis. No pleural effusion or pneumothorax. ABDOMEN AND PELVIS: LIVER: The liver is unremarkable. GALLBLADDER AND BILE DUCTS: Status post cholecystectomy. No biliary ductal dilatation. SPLEEN: No acute abnormality. PANCREAS: No acute abnormality. ADRENAL GLANDS: No acute abnormality. KIDNEYS, URETERS AND BLADDER: Scattered subcentimeter right renal cysts, benign (Bosniak I). No follow-up is recommended. No stones in the kidneys or ureters. No hydronephrosis. No perinephric or periureteral stranding. Urinary bladder is unremarkable. GI AND BOWEL: Stomach demonstrates no acute abnormality. Mild sigmoid diverticulosis, without evidence of diverticulitis. There is no bowel obstruction. REPRODUCTIVE ORGANS: Status post hysterectomy. PERITONEUM AND RETROPERITONEUM: No ascites. No free air. VASCULATURE: Aorta is normal in caliber. ABDOMINAL AND PELVIS LYMPH NODES: No lymphadenopathy. BONES AND SOFT TISSUES: Degenerative changes of the visualized thoracolumbar spine. Status post left breast lumpectomy and bilateral mammoplasties. Surgical clips in the left axilla. No acute  osseous abnormality. Mild subcutaneous stranding along the patient's right chest port (images 4 and 7), correlate for cellulitis . IMPRESSION: 1. Mild subcutaneous standing along the right chest port, correlate for cellulitis. 2. New 8 mm solid right lower lobe pulmonary nodule, indeterminate. Follow-up CT chest or PET-CT is suggested in 3 months. Electronically signed by: Pinkie Pebbles MD 09/10/2024 09:34 PM EST RP Workstation: [id number]   DG Chest Port 1 View if patient is in a treatment room. Result Date: 09/10/2024 EXAM: 1 VIEW(S) XRAY OF THE CHEST 09/10/2024 07:00:00 PM COMPARISON: 08/18/2024 CLINICAL HISTORY: Possible sepsis and back pain. FINDINGS: LUNGS AND PLEURA: Mild central vascular congestion is noted without interstitial edema. No focal infiltrate is seen. No pleural effusion. No pneumothorax. HEART AND MEDIASTINUM:  The cardiac shadow is within normal limits. BONES AND SOFT TISSUES: No bony abnormality is noted. IMPRESSION: 1. Mild central vascular congestion without interstitial edema. Electronically signed by: Oneil Devonshire MD 09/10/2024 07:05 PM EST RP Workstation: [id number]   VAS US  UPPER EXTREMITY VENOUS DUPLEX Result Date: 09/08/2024 UPPER VENOUS STUDY  Patient Name:  LEYLANI DULEY  Date of Exam:   09/08/2024 Medical Rec #: [id number]        Accession #:    [id number] Date of Birth: 1961/07/07         Patient Gender: F Patient Age:   39 years Exam Location:  [address] Procedure:      VAS US  UPPER EXTREMITY VENOUS DUPLEX Referring Phys: KAITLYN WALISIEWICZ --------------------------------------------------------------------------------  Indications: Swelling Risk Factors: Obesity. Comparison Study: None. Performing Technologist: Garnette Rockers  Examination Guidelines: A complete evaluation includes B-mode imaging, spectral Doppler, color Doppler, and power Doppler as needed of all accessible portions of each vessel. Bilateral testing is considered an integral part of a complete examination. Limited examinations for reoccurring indications may be performed as noted.  Right Findings: +----------+------------+---------+-----------+----------+-------+ RIGHT     CompressiblePhasicitySpontaneousPropertiesSummary +----------+------------+---------+-----------+----------+-------+ IJV           Full       Yes       Yes                      +----------+------------+---------+-----------+----------+-------+ Subclavian               Yes       Yes                      +----------+------------+---------+-----------+----------+-------+ Axillary      Full       Yes       Yes                      +----------+------------+---------+-----------+----------+-------+ Brachial      Full       Yes       Yes                      +----------+------------+---------+-----------+----------+-------+ Radial         Full       Yes       Yes                      +----------+------------+---------+-----------+----------+-------+ Ulnar         Full       Yes       Yes                      +----------+------------+---------+-----------+----------+-------+ Cephalic  Full                 Yes                      +----------+------------+---------+-----------+----------+-------+ Basilic       Full                 Yes                      +----------+------------+---------+-----------+----------+-------+  Left Findings: +----------+------------+---------+-----------+----------+-------+ LEFT      CompressiblePhasicitySpontaneousPropertiesSummary +----------+------------+---------+-----------+----------+-------+ Subclavian               Yes       Yes                      +----------+------------+---------+-----------+----------+-------+  Summary:  Right: No evidence of deep vein thrombosis in the upper extremity. No evidence of superficial vein thrombosis in the upper extremity.  Left: No evidence of thrombosis in the subclavian.  *See table(s) above for measurements and observations.  Diagnosing physician: Gaile New MD Electronically signed by Gaile New MD on 09/08/2024 at 3:52:30 PM.    Final    DG CHEST PORT 1 VIEW Result Date: 08/18/2024 CLINICAL DATA:  Port insertion EXAM: PORTABLE CHEST 1 VIEW COMPARISON:  08/17/2023 FINDINGS: Mild cardiomegaly. No acute airspace disease, pleural effusion, or pneumothorax. Interim placement of right-sided central venous port with tip projecting over the SVC. IMPRESSION: Interim placement of right-sided central venous port with tip projecting over the SVC. No pneumothorax. Mild cardiomegaly Electronically Signed   By: Luke Bun M.D.   On: 08/18/2024 15:57   DG C-Arm 1-60 Min-No Report Result Date: 08/18/2024 Fluoroscopy was utilized by the requesting physician.  No radiographic interpretation.   I spent involved in  face-to-face and non-face-to-face activities for this patient on the day of the visit. Professional time spent includes the following activities: Preparing to see the patient (review of tests), Obtaining and reviewing separately obtained history (ED note, H&P, hospitalist progress note), Performing a medically appropriate examination and evaluation , Ordering medications/labs/imaging, referring and communicating with other health care professionals, Documenting clinical information in the EMR, Independently interpreting results (not separately reported), Communicating results to the patient/family, Counseling and educating the patient/family and Care coordination (not separately reported).  Electronically signed by:   Plan d/w requesting provider as well as ID pharm D  Of note, portions of this note may have been created with voice recognition software. While this note has been edited for accuracy, occasional wrong-word or 'sound-a-like' substitutions may have occurred due to the inherent limitations of voice recognition software.   Annalee Orem, MD Infectious Disease Physician [facility] Pager: [phone]

## 2024-09-13 ENCOUNTER — Inpatient Hospital Stay (HOSPITAL_COMMUNITY)

## 2024-09-13 DIAGNOSIS — R7881 Bacteremia: Secondary | ICD-10-CM

## 2024-09-13 DIAGNOSIS — A419 Sepsis, unspecified organism: Secondary | ICD-10-CM | POA: Diagnosis not present

## 2024-09-13 LAB — CBC WITH DIFFERENTIAL/PLATELET
Abs Immature Granulocytes: 1.2 K/uL — ABNORMAL HIGH (ref 0.00–0.07)
Basophils Absolute: 0 K/uL (ref 0.0–0.1)
Basophils Relative: 0 %
Eosinophils Absolute: 0 K/uL (ref 0.0–0.5)
Eosinophils Relative: 0 %
HCT: 33.3 % — ABNORMAL LOW (ref 36.0–46.0)
Hemoglobin: 11 g/dL — ABNORMAL LOW (ref 12.0–15.0)
Immature Granulocytes: 13 %
Lymphocytes Relative: 10 %
Lymphs Abs: 0.9 K/uL (ref 0.7–4.0)
MCH: 28.2 pg (ref 26.0–34.0)
MCHC: 33 g/dL (ref 30.0–36.0)
MCV: 85.4 fL (ref 80.0–100.0)
Monocytes Absolute: 0.8 K/uL (ref 0.1–1.0)
Monocytes Relative: 9 %
Neutro Abs: 6.3 K/uL (ref 1.7–7.7)
Neutrophils Relative %: 68 %
Platelets: 149 K/uL — ABNORMAL LOW (ref 150–400)
RBC: 3.9 MIL/uL (ref 3.87–5.11)
RDW: 15.3 % (ref 11.5–15.5)
WBC: 9.3 K/uL (ref 4.0–10.5)
nRBC: 0 % (ref 0.0–0.2)

## 2024-09-13 LAB — COMPREHENSIVE METABOLIC PANEL WITH GFR
ALT: 35 U/L (ref 0–44)
AST: 39 U/L (ref 15–41)
Albumin: 2.9 g/dL — ABNORMAL LOW (ref 3.5–5.0)
Alkaline Phosphatase: 103 U/L (ref 38–126)
Anion gap: 11 (ref 5–15)
BUN: 7 mg/dL — ABNORMAL LOW (ref 8–23)
CO2: 23 mmol/L (ref 22–32)
Calcium: 8.2 mg/dL — ABNORMAL LOW (ref 8.9–10.3)
Chloride: 103 mmol/L (ref 98–111)
Creatinine, Ser: 0.56 mg/dL (ref 0.44–1.00)
GFR, Estimated: >60 mL/min (ref >60)
Glucose, Bld: 123 mg/dL — ABNORMAL HIGH (ref 70–99)
Potassium: 3 mmol/L — ABNORMAL LOW (ref 3.5–5.1)
Sodium: 137 mmol/L (ref 135–145)
Total Bilirubin: 0.4 mg/dL (ref 0.0–1.2)
Total Protein: 5.5 g/dL — ABNORMAL LOW (ref 6.5–8.1)

## 2024-09-13 LAB — ECHOCARDIOGRAM COMPLETE
AR max vel: 2.33 cm2
AV Area VTI: 2.2 cm2
AV Area mean vel: 2.23 cm2
AV Mean grad: 8 mmHg
AV Peak grad: 14.4 mmHg
Ao pk vel: 1.9 m/s
Area-P 1/2: 3.89 cm2
Height: 65 in
S' Lateral: 3.6 cm
Weight: 3488.56 [oz_av]

## 2024-09-13 LAB — CULTURE, BLOOD (ROUTINE X 2): Special Requests: ADEQUATE

## 2024-09-13 LAB — MAGNESIUM: Magnesium: 2.3 mg/dL (ref 1.7–2.4)

## 2024-09-13 MED ORDER — POTASSIUM CHLORIDE CRYS ER 20 MEQ PO TBCR
40.0000 meq | EXTENDED_RELEASE_TABLET | ORAL | Status: AC
  Administered 2024-09-13 (×2): 40 meq via ORAL
  Filled 2024-09-13 (×2): qty 2

## 2024-09-13 NOTE — Anesthesia Preprocedure Evaluation (Signed)
 Anesthesia Evaluation  Patient identified by MRN, date of birth, ID band Patient awake    Reviewed: Allergy & Precautions, NPO status , Patient's Chart, lab work & pertinent test results  History of Anesthesia Complications Negative for: history of anesthetic complications  Airway Mallampati: I  TM Distance: >3 FB Neck ROM: Full    Dental  (+) Dental Advisory Given   Pulmonary asthma , COPD,  COPD inhaler   breath sounds clear to auscultation       Cardiovascular hypertension, Pt. on medications (-) angina  Rhythm:Regular Rate:Normal  09/13/2024 ECHO: EF 60-65%, normal LVF, mild LVH, normal RVF, trivial MR   Neuro/Psych  Headaches  Anxiety        GI/Hepatic negative GI ROS, Neg liver ROS,,,  Endo/Other  BMI 36  Renal/GU negative Renal ROS     Musculoskeletal   Abdominal   Peds  Hematology Hb 11.0, plt 149k   Anesthesia Other Findings breast cancer  Reproductive/Obstetrics                              Anesthesia Physical Anesthesia Plan  ASA: 3  Anesthesia Plan: MAC   Post-op Pain Management: Tylenol  PO (pre-op)*   Induction:   PONV Risk Score and Plan: 2 and Ondansetron  and Dexamethasone   Airway Management Planned: Natural Airway and Simple Face Mask  Additional Equipment: None  Intra-op Plan:   Post-operative Plan:   Informed Consent: I have reviewed the patients History and Physical, chart, labs and discussed the procedure including the risks, benefits and alternatives for the proposed anesthesia with the patient or authorized representative who has indicated his/her understanding and acceptance.     Dental advisory given  Plan Discussed with: CRNA and Surgeon  Anesthesia Plan Comments:          Anesthesia Quick Evaluation

## 2024-09-13 NOTE — Plan of Care (Signed)

## 2024-09-13 NOTE — Progress Notes (Signed)
 Patient ID: Mary Mendez, female   DOB: 05-Mar-1961, 63 y.o.   MRN: [id number]  Called regarding this patient.  She is four weeks s/p Right internal jugular port placement by Dr. Vernetta.  Now with bacteremia and signs of cellulitis around the port.  Patient is not NPO.    Will plan to remove port in AM.  NPO after midnight.  Mary Mendez. Belinda, MD, [facility]  General Surgery   09/13/2024 2:11 PM

## 2024-09-13 NOTE — Progress Notes (Signed)
  Echocardiogram 2D Echocardiogram has been performed.  Charmaine KANDICE Gaskins 09/13/2024, 10:08 AM

## 2024-09-13 NOTE — Progress Notes (Signed)
 PROGRESS NOTE    Mary Mendez  FMW:[id number] DOB: 02-13-1961 DOA: 09/10/2024 PCP: [name], MD   Brief Narrative:  63 year old female with history of left breast cancer, asthma, hyperlipidemia, hypertension, anxiety presented with fever and blood culture growing MSSA.  On presentation, WBC was 4.3, COVID/influenza/RSV PCR negative.  AST of 53 and ALT of 54.  Chest x-ray showed mild central vascular congestion with interstitial edema.  CT L-spine showed mild degenerative changes of the visualized thoracolumbar spine.  CT chest/abdomen/pelvis with contrast showed mild subcutaneous stranding along the right chest port, correlate for cellulitis; new 8 mm right lower lobe pulmonary nodule.  She was started on IV antibiotics.  ID was consulted.  Assessment & Plan:   MSSA bacteremia - Possibly related to Port-A-Cath infection.  Continue IV cefazolin .  ID recommending 2D echo and MRI of lumbar spine which are pending.  Follow repeat blood cultures.  Left breast cancer -Outpatient follow-up with oncology  Essential hypertension Hyperlipidemia - Continue amlodipine and statin  Hyponatremia - Resolved  Hyperkalemia - Resolved  Hypokalemia - Replace.  Repeat a.m. labs  Thrombocytopenia - Mild.  No signs of bleeding.  Monitor intermittently  Mildly elevated LFTs - Resolved  Obesity class II - Outpatient follow-up  Mild intermittent asthma - Continue loratadine and montelukast.  Continue as needed albuterol  nebulizer.  No longer using Advair   DVT prophylaxis: Lovenox Code Status: Full Family Communication: Daughter at bedside Disposition Plan: Status is: Inpatient Remains inpatient appropriate because: Of severity of illness    Consultants: ID  Procedures: None  Antimicrobials: Cefazolin  from 09/11/2024 onwards   Subjective: Patient seen and examined at bedside.  Has intermittent fever and sweats.  Complains of intermittent pain.  No abdominal pain,  vomiting or shortness of breath reported.   Objective: Vitals:   09/12/24 1717 09/12/24 2042 09/13/24 0350 09/13/24 0606  BP: 117/74 100/66 111/70   Pulse: 96 77 87   Resp: 20 16 18    Temp: (!) 100.4 F (38 C) 98.6 F (37 C) 98.8 F (37.1 C)   TempSrc: Oral Oral Oral   SpO2: 95% 97% 99%   Weight:    98.9 kg  Height:        Intake/Output Summary (Last 24 hours) at 09/13/2024 0919 Last data filed at 09/12/2024 1200 Gross per 24 hour  Intake 600 ml  Output --  Net 600 ml   Filed Weights   09/11/24 1254 09/13/24 0606  Weight: 99.8 kg 98.9 kg    Examination:  General: On room air.  No distress ENT/neck: No thyromegaly.  JVD is not elevated  respiratory: Decreased breath sounds at bases bilaterally with some crackles; no wheezing  CVS: S1-S2 heard, rate controlled currently Abdominal: Soft, obese, nontender, slightly distended; no organomegaly, bowel sounds are heard Extremities: Trace lower extremity edema; no cyanosis  CNS: Awake and alert.  No focal neurologic deficit.  Moves extremities Lymph: No obvious lymphadenopathy Skin: No obvious ecchymosis/lesions  psych: Affect, judgment and mood are normal  musculoskeletal: No obvious joint swelling/deformity     Data Reviewed: I have personally reviewed following labs and imaging studies  CBC: Recent Labs  Lab 09/10/24 1855 09/12/24 0604 09/13/24 0052  WBC 4.3 8.2 9.3  NEUTROABS 2.4 4.8 6.3  HGB 13.2 12.7 11.0*  HCT 39.2 40.0 33.3*  MCV 84.1 88.7 85.4  PLT 196 144* 149*   Basic Metabolic Panel: Recent Labs  Lab 09/10/24 1855 09/12/24 0604 09/13/24 0052  NA 132* 134* 137  K 5.5*  3.4* 3.0*  CL 97* 101 103  CO2 23 21* 23  GLUCOSE 138* 99 123*  BUN 12 8 7*  CREATININE 0.67 0.62 0.56  CALCIUM 8.8* 8.2* 8.2*  MG  --   --  2.3   GFR: Estimated Creatinine Clearance: 86 mL/min (by C-G formula based on SCr of 0.56 mg/dL). Liver Function Tests: Recent Labs  Lab 09/10/24 1855 09/12/24 0604 09/13/24 0052   AST 53* 50* 39  ALT 54* 50* 35  ALKPHOS 109 105 103  BILITOT 0.7 0.5 0.4  PROT 6.5 6.0* 5.5*  ALBUMIN 3.8 3.1* 2.9*   No results for input(s): LIPASE, AMYLASE in the last 168 hours. No results for input(s): AMMONIA in the last 168 hours. Coagulation Profile: Recent Labs  Lab 09/10/24 1945  INR 0.9   Cardiac Enzymes: No results for input(s): CKTOTAL, CKMB, CKMBINDEX, TROPONINI in the last 168 hours. BNP (last 3 results) No results for input(s): PROBNP in the last 8760 hours. HbA1C: No results for input(s): HGBA1C in the last 72 hours. CBG: No results for input(s): GLUCAP in the last 168 hours. Lipid Profile: No results for input(s): CHOL, HDL, LDLCALC, TRIG, CHOLHDL, LDLDIRECT in the last 72 hours. Thyroid Function Tests: No results for input(s): TSH, T4TOTAL, FREET4, T3FREE, THYROIDAB in the last 72 hours. Anemia Panel: No results for input(s): VITAMINB12, FOLATE, FERRITIN, TIBC, IRON, RETICCTPCT in the last 72 hours. Sepsis Labs: Recent Labs  Lab 09/10/24 1855  LATICACIDVEN 1.5    Recent Results (from the past 240 hours)  Culture, blood (Routine x 2)     Status: Abnormal (Preliminary result)   Collection Time: 09/10/24  6:49 PM   Specimen: BLOOD  Result Value Ref Range Status   Specimen Description   Final    BLOOD RIGHT ANTECUBITAL Performed at [facility], [address] [place], [place] [address]    Special Requests   Final    Blood Culture adequate volume BOTTLES DRAWN AEROBIC AND ANAEROBIC Performed at [facility], [address], [place], [place] [address]    Culture  Setup Time   Final    GRAM POSITIVE COCCI IN CLUSTERS IN BOTH AEROBIC AND ANAEROBIC BOTTLES CRITICAL RESULT CALLED TO, READ BACK BY AND VERIFIED WITH: PHARMD CRYSTAL ROBERTSON ON 09/11/24 @ 1320 BY DRT    Culture (A)  Final    STAPHYLOCOCCUS AUREUS SUSCEPTIBILITIES TO FOLLOW Performed at  [facility], [address] [place], [place] [address]    Report Status PENDING  Incomplete  Blood Culture ID Panel (Reflexed)     Status: Abnormal   Collection Time: 09/10/24  6:49 PM  Result Value Ref Range Status   Enterococcus faecalis NOT DETECTED NOT DETECTED Final   Enterococcus Faecium NOT DETECTED NOT DETECTED Final   Listeria monocytogenes NOT DETECTED NOT DETECTED Final   Staphylococcus species DETECTED (A) NOT DETECTED Final    Comment: CRITICAL RESULT CALLED TO, READ BACK BY AND VERIFIED WITH: PHARMD CRYSTAL ROBERTSON ON 09/11/24 @ 1320 BY DRT    Staphylococcus aureus (BCID) DETECTED (A) NOT DETECTED Final    Comment: CRITICAL RESULT CALLED TO, READ BACK BY AND VERIFIED WITH: PHARMD CRYSTAL ROBERTSON ON 09/11/24 @ 1320 BY DRT    Staphylococcus epidermidis NOT DETECTED NOT DETECTED Final   Staphylococcus lugdunensis NOT DETECTED NOT DETECTED Final   Streptococcus species NOT DETECTED NOT DETECTED Final   Streptococcus agalactiae NOT DETECTED NOT DETECTED Final   Streptococcus pneumoniae NOT DETECTED NOT DETECTED Final   Streptococcus pyogenes NOT  DETECTED NOT DETECTED Final   A.calcoaceticus-baumannii NOT DETECTED NOT DETECTED Final   Bacteroides fragilis NOT DETECTED NOT DETECTED Final   Enterobacterales NOT DETECTED NOT DETECTED Final   Enterobacter cloacae complex NOT DETECTED NOT DETECTED Final   Escherichia coli NOT DETECTED NOT DETECTED Final   Klebsiella aerogenes NOT DETECTED NOT DETECTED Final   Klebsiella oxytoca NOT DETECTED NOT DETECTED Final   Klebsiella pneumoniae NOT DETECTED NOT DETECTED Final   Proteus species NOT DETECTED NOT DETECTED Final   Salmonella species NOT DETECTED NOT DETECTED Final   Serratia marcescens NOT DETECTED NOT DETECTED Final   Haemophilus influenzae NOT DETECTED NOT DETECTED Final   Neisseria meningitidis NOT DETECTED NOT DETECTED Final   Pseudomonas aeruginosa NOT DETECTED NOT DETECTED Final   Stenotrophomonas  maltophilia NOT DETECTED NOT DETECTED Final   Candida albicans NOT DETECTED NOT DETECTED Final   Candida auris NOT DETECTED NOT DETECTED Final   Candida glabrata NOT DETECTED NOT DETECTED Final   Candida krusei NOT DETECTED NOT DETECTED Final   Candida parapsilosis NOT DETECTED NOT DETECTED Final   Candida tropicalis NOT DETECTED NOT DETECTED Final   Cryptococcus neoformans/gattii NOT DETECTED NOT DETECTED Final   Meth resistant mecA/C and MREJ NOT DETECTED NOT DETECTED Final    Comment: Performed at [facility], [address] [place], [place] [address]  Resp panel by RT-PCR (RSV, Flu A&B, Covid) Anterior Nasal Swab     Status: None   Collection Time: 09/10/24  8:36 PM   Specimen: Anterior Nasal Swab  Result Value Ref Range Status   SARS Coronavirus 2 by RT PCR NEGATIVE NEGATIVE Final    Comment: (NOTE) SARS-CoV-2 target nucleic acids are NOT DETECTED.  The SARS-CoV-2 RNA is generally detectable in upper respiratory specimens during the acute phase of infection. The lowest concentration of SARS-CoV-2 viral copies this assay can detect is 138 copies/mL. A negative result does not preclude SARS-Cov-2 infection and should not be used as the sole basis for treatment or other patient management decisions. A negative result may occur with  improper specimen collection/handling, submission of specimen other than nasopharyngeal swab, presence of viral mutation(s) within the areas targeted by this assay, and inadequate number of viral copies(<138 copies/mL). A negative result must be combined with clinical observations, patient history, and epidemiological information. The expected result is Negative.  Fact Sheet for Patients:  bloggercourse.com  Fact Sheet for Healthcare Providers:  seriousbroker.it  This test is no t yet approved or cleared by the [place]  FDA and  has been authorized for detection and/or diagnosis of  SARS-CoV-2 by FDA under an Emergency Use Authorization (EUA). This EUA will remain  in effect (meaning this test can be used) for the duration of the COVID-19 declaration under Section 564(b)(1) of the Act, 21 U.S.C.section 360bbb-3(b)(1), unless the authorization is terminated  or revoked sooner.       Influenza A by PCR NEGATIVE NEGATIVE Final   Influenza B by PCR NEGATIVE NEGATIVE Final    Comment: (NOTE) The Xpert Xpress SARS-CoV-2/FLU/RSV plus assay is intended as an aid in the diagnosis of influenza from Nasopharyngeal swab specimens and should not be used as a sole basis for treatment. Nasal washings and aspirates are unacceptable for Xpert Xpress SARS-CoV-2/FLU/RSV testing.  Fact Sheet for Patients: bloggercourse.com  Fact Sheet for Healthcare Providers: seriousbroker.it  This test is not yet approved or cleared by the [place]  FDA and has been authorized for detection and/or diagnosis of SARS-CoV-2 by FDA under an Emergency  Use Authorization (EUA). This EUA will remain in effect (meaning this test can be used) for the duration of the COVID-19 declaration under Section 564(b)(1) of the Act, 21 U.S.C. section 360bbb-3(b)(1), unless the authorization is terminated or revoked.     Resp Syncytial Virus by PCR NEGATIVE NEGATIVE Final    Comment: (NOTE) Fact Sheet for Patients: bloggercourse.com  Fact Sheet for Healthcare Providers: seriousbroker.it  This test is not yet approved or cleared by the [place]  FDA and has been authorized for detection and/or diagnosis of SARS-CoV-2 by FDA under an Emergency Use Authorization (EUA). This EUA will remain in effect (meaning this test can be used) for the duration of the COVID-19 declaration under Section 564(b)(1) of the Act, 21 U.S.C. section 360bbb-3(b)(1), unless the authorization is terminated  or revoked.  Performed at [facility], [address] [place], [place] [address]   Culture, blood (Routine X 2) w Reflex to ID Panel     Status: None (Preliminary result)   Collection Time: 09/13/24 12:52 AM   Specimen: BLOOD RIGHT ARM  Result Value Ref Range Status   Specimen Description   Final    BLOOD RIGHT ARM Performed at [facility] [place] [facility], [address], [place], [place] [address]    Special Requests   Final    BOTTLES DRAWN AEROBIC ONLY Blood Culture adequate volume Performed at [facility], [address] [place], [place] [address]    Culture PENDING  Incomplete   Report Status PENDING  Incomplete  Culture, blood (Routine X 2) w Reflex to ID Panel     Status: None (Preliminary result)   Collection Time: 09/13/24 12:52 AM   Specimen: BLOOD RIGHT ARM  Result Value Ref Range Status   Specimen Description   Final    BLOOD RIGHT ARM Performed at [facility], [address] [place], [place] [address]    Special Requests   Final    BOTTLES DRAWN AEROBIC ONLY Blood Culture adequate volume Performed at [facility], [address] [place], [place] [address]    Culture PENDING  Incomplete   Report Status PENDING  Incomplete         Radiology Studies: No results found.       Scheduled Meds:  amLODipine  10 mg Oral Daily   enoxaparin (LOVENOX) injection  50 mg Subcutaneous Q24H   ketorolac   30 mg Intravenous Q6H   loratadine  10 mg Oral Daily   montelukast  10 mg Oral QHS   rosuvastatin  10 mg Oral Daily   Continuous Infusions:   ceFAZolin  (ANCEF ) IV 2 g (09/13/24 0601)          Sophie Mao, MD [facility] 09/13/2024, 9:19 AM

## 2024-09-13 NOTE — Progress Notes (Signed)
 ID brief note   Labs noted     Latest Ref Rng & Units 09/13/2024   12:52 AM 09/12/2024    6:04 AM 09/10/2024    6:55 PM  CBC  WBC 4.0 - 10.5 K/uL 9.3  8.2  4.3   Hemoglobin 12.0 - 15.0 g/dL 88.9  87.2  86.7   Hematocrit 36.0 - 46.0 % 33.3  40.0  39.2   Platelets 150 - 400 K/uL 149  144  196       Latest Ref Rng & Units 09/13/2024   12:52 AM 09/12/2024    6:04 AM 09/10/2024    6:55 PM  CMP  Glucose 70 - 99 mg/dL 876  99  861   BUN 8 - 23 mg/dL 7  8  12    Creatinine 0.44 - 1.00 mg/dL 9.43  9.37  9.32   Sodium 135 - 145 mmol/L 137  134  132   Potassium 3.5 - 5.1 mmol/L 3.0  3.4  5.5   Chloride 98 - 111 mmol/L 103  101  97   CO2 22 - 32 mmol/L 23  21  23    Calcium 8.9 - 10.3 mg/dL 8.2  8.2  8.8   Total Protein 6.5 - 8.1 g/dL 5.5  6.0  6.5   Total Bilirubin 0.0 - 1.2 mg/dL 0.4  0.5  0.7   Alkaline Phos 38 - 126 U/L 103  105  109   AST 15 - 41 U/L 39  50  53   ALT 0 - 44 U/L 35  50  54    Results for orders placed or performed during the hospital encounter of 09/10/24  Culture, blood (Routine x 2)     Status: Abnormal   Collection Time: 09/10/24  6:49 PM   Specimen: BLOOD  Result Value Ref Range Status   Specimen Description   Final    BLOOD RIGHT ANTECUBITAL Performed at [facility], [address] [place], [place] [address]    Special Requests   Final    Blood Culture adequate volume BOTTLES DRAWN AEROBIC AND ANAEROBIC Performed at [facility], [address] [place], [place] [address]    Culture  Setup Time   Final    GRAM POSITIVE COCCI IN CLUSTERS IN BOTH AEROBIC AND ANAEROBIC BOTTLES CRITICAL RESULT CALLED TO, READ BACK BY AND VERIFIED WITH: PHARMD CRYSTAL ROBERTSON ON 09/11/24 @ 1320 BY DRT Performed at [facility], [address], [place], [place] [address]    Culture STAPHYLOCOCCUS AUREUS (A)  Final   Report Status 09/13/2024 FINAL  Final   Organism ID, Bacteria STAPHYLOCOCCUS AUREUS  Final       Susceptibility   Staphylococcus aureus - MIC*    CIPROFLOXACIN <=0.5 SENSITIVE Sensitive     ERYTHROMYCIN <=0.25 SENSITIVE Sensitive     GENTAMICIN <=0.5 SENSITIVE Sensitive     OXACILLIN <=0.25 SENSITIVE Sensitive     TETRACYCLINE <=1 SENSITIVE Sensitive     VANCOMYCIN <=0.5 SENSITIVE Sensitive     TRIMETH/SULFA <=10 SENSITIVE Sensitive     CLINDAMYCIN <=0.25 SENSITIVE Sensitive     RIFAMPIN <=0.5 SENSITIVE Sensitive     Inducible Clindamycin NEGATIVE Sensitive     LINEZOLID 1 SENSITIVE Sensitive     * STAPHYLOCOCCUS AUREUS  Blood Culture ID Panel (Reflexed)     Status: Abnormal   Collection Time: 09/10/24  6:49 PM  Result Value Ref Range Status   Enterococcus faecalis NOT DETECTED NOT DETECTED Final   Enterococcus Faecium  NOT DETECTED NOT DETECTED Final   Listeria monocytogenes NOT DETECTED NOT DETECTED Final   Staphylococcus species DETECTED (A) NOT DETECTED Final    Comment: CRITICAL RESULT CALLED TO, READ BACK BY AND VERIFIED WITH: PHARMD CRYSTAL ROBERTSON ON 09/11/24 @ 1320 BY DRT    Staphylococcus aureus (BCID) DETECTED (A) NOT DETECTED Final    Comment: CRITICAL RESULT CALLED TO, READ BACK BY AND VERIFIED WITH: PHARMD CRYSTAL ROBERTSON ON 09/11/24 @ 1320 BY DRT    Staphylococcus epidermidis NOT DETECTED NOT DETECTED Final   Staphylococcus lugdunensis NOT DETECTED NOT DETECTED Final   Streptococcus species NOT DETECTED NOT DETECTED Final   Streptococcus agalactiae NOT DETECTED NOT DETECTED Final   Streptococcus pneumoniae NOT DETECTED NOT DETECTED Final   Streptococcus pyogenes NOT DETECTED NOT DETECTED Final   A.calcoaceticus-baumannii NOT DETECTED NOT DETECTED Final   Bacteroides fragilis NOT DETECTED NOT DETECTED Final   Enterobacterales NOT DETECTED NOT DETECTED Final   Enterobacter cloacae complex NOT DETECTED NOT DETECTED Final   Escherichia coli NOT DETECTED NOT DETECTED Final   Klebsiella aerogenes NOT DETECTED NOT DETECTED Final   Klebsiella oxytoca NOT  DETECTED NOT DETECTED Final   Klebsiella pneumoniae NOT DETECTED NOT DETECTED Final   Proteus species NOT DETECTED NOT DETECTED Final   Salmonella species NOT DETECTED NOT DETECTED Final   Serratia marcescens NOT DETECTED NOT DETECTED Final   Haemophilus influenzae NOT DETECTED NOT DETECTED Final   Neisseria meningitidis NOT DETECTED NOT DETECTED Final   Pseudomonas aeruginosa NOT DETECTED NOT DETECTED Final   Stenotrophomonas maltophilia NOT DETECTED NOT DETECTED Final   Candida albicans NOT DETECTED NOT DETECTED Final   Candida auris NOT DETECTED NOT DETECTED Final   Candida glabrata NOT DETECTED NOT DETECTED Final   Candida krusei NOT DETECTED NOT DETECTED Final   Candida parapsilosis NOT DETECTED NOT DETECTED Final   Candida tropicalis NOT DETECTED NOT DETECTED Final   Cryptococcus neoformans/gattii NOT DETECTED NOT DETECTED Final   Meth resistant mecA/C and MREJ NOT DETECTED NOT DETECTED Final    Comment: Performed at [facility], [address], [place], [place] [address]  Resp panel by RT-PCR (RSV, Flu A&B, Covid) Anterior Nasal Swab     Status: None   Collection Time: 09/10/24  8:36 PM   Specimen: Anterior Nasal Swab  Result Value Ref Range Status   SARS Coronavirus 2 by RT PCR NEGATIVE NEGATIVE Final    Comment: (NOTE) SARS-CoV-2 target nucleic acids are NOT DETECTED.  The SARS-CoV-2 RNA is generally detectable in upper respiratory specimens during the acute phase of infection. The lowest concentration of SARS-CoV-2 viral copies this assay can detect is 138 copies/mL. A negative result does not preclude SARS-Cov-2 infection and should not be used as the sole basis for treatment or other patient management decisions. A negative result may occur with  improper specimen collection/handling, submission of specimen other than nasopharyngeal swab, presence of viral mutation(s) within the areas targeted by this assay, and inadequate number of viral copies(<138 copies/mL).  A negative result must be combined with clinical observations, patient history, and epidemiological information. The expected result is Negative.  Fact Sheet for Patients:  bloggercourse.com  Fact Sheet for Healthcare Providers:  seriousbroker.it  This test is no t yet approved or cleared by the [place]  FDA and  has been authorized for detection and/or diagnosis of SARS-CoV-2 by FDA under an Emergency Use Authorization (EUA). This EUA will remain  in effect (meaning this test can be used) for the duration of the  COVID-19 declaration under Section 564(b)(1) of the Act, 21 U.S.C.section 360bbb-3(b)(1), unless the authorization is terminated  or revoked sooner.       Influenza A by PCR NEGATIVE NEGATIVE Final   Influenza B by PCR NEGATIVE NEGATIVE Final    Comment: (NOTE) The Xpert Xpress SARS-CoV-2/FLU/RSV plus assay is intended as an aid in the diagnosis of influenza from Nasopharyngeal swab specimens and should not be used as a sole basis for treatment. Nasal washings and aspirates are unacceptable for Xpert Xpress SARS-CoV-2/FLU/RSV testing.  Fact Sheet for Patients: bloggercourse.com  Fact Sheet for Healthcare Providers: seriousbroker.it  This test is not yet approved or cleared by the [place]  FDA and has been authorized for detection and/or diagnosis of SARS-CoV-2 by FDA under an Emergency Use Authorization (EUA). This EUA will remain in effect (meaning this test can be used) for the duration of the COVID-19 declaration under Section 564(b)(1) of the Act, 21 U.S.C. section 360bbb-3(b)(1), unless the authorization is terminated or revoked.     Resp Syncytial Virus by PCR NEGATIVE NEGATIVE Final    Comment: (NOTE) Fact Sheet for Patients: bloggercourse.com  Fact Sheet for Healthcare  Providers: seriousbroker.it  This test is not yet approved or cleared by the [place]  FDA and has been authorized for detection and/or diagnosis of SARS-CoV-2 by FDA under an Emergency Use Authorization (EUA). This EUA will remain in effect (meaning this test can be used) for the duration of the COVID-19 declaration under Section 564(b)(1) of the Act, 21 U.S.C. section 360bbb-3(b)(1), unless the authorization is terminated or revoked.  Performed at [facility], [address] [place], [place] [address]   Culture, blood (Routine X 2) w Reflex to ID Panel     Status: None (Preliminary result)   Collection Time: 09/13/24 12:52 AM   Specimen: BLOOD RIGHT ARM  Result Value Ref Range Status   Specimen Description   Final    BLOOD RIGHT ARM Performed at [facility], [address] [place], [place] [address]    Special Requests   Final    BOTTLES DRAWN AEROBIC ONLY Blood Culture adequate volume Performed at [facility], [address] [place], [place] [address]    Culture   Final    NO GROWTH < 12 HOURS Performed at [facility], [address] [place], [place] [address]    Report Status PENDING  Incomplete  Culture, blood (Routine X 2) w Reflex to ID Panel     Status: None (Preliminary result)   Collection Time: 09/13/24 12:52 AM   Specimen: BLOOD RIGHT ARM  Result Value Ref Range Status   Specimen Description   Final    BLOOD RIGHT ARM Performed at [facility], [address] [place], [place] [address]    Special Requests   Final    BOTTLES DRAWN AEROBIC ONLY Blood Culture adequate volume Performed at [facility], [address], [place], [place] [address]    Culture   Final    NO GROWTH < 12 HOURS Performed at [facility], [address], [place], [place] [address]    Report Status PENDING  Incomplete     T max 100.4  D/w primary about need for  portacath removal. Surgery has been consulted   Repeat blood cx 11/15 NG< 24 hrs  TTE no vegetations  MRI L spine pending   Plan Continue IV cefazolin , sensitivities reviewed  Fu MRI L spine  Fu blood  cultures  I am here this weekend, Dr Dennise to follow starting Monday  Sharanda Shinault, MD Infectious Disease Physician [facility] [address] [place], [place] [address] Phone: [phone]  Fax: [phone]

## 2024-09-14 ENCOUNTER — Encounter (HOSPITAL_COMMUNITY): Admission: EM | Disposition: A | Payer: Self-pay | Source: Home / Self Care | Attending: Internal Medicine

## 2024-09-14 ENCOUNTER — Encounter (HOSPITAL_COMMUNITY): Payer: Self-pay | Admitting: Anesthesiology

## 2024-09-14 ENCOUNTER — Inpatient Hospital Stay (HOSPITAL_COMMUNITY)

## 2024-09-14 ENCOUNTER — Inpatient Hospital Stay (HOSPITAL_COMMUNITY): Payer: Self-pay | Admitting: Anesthesiology

## 2024-09-14 DIAGNOSIS — A419 Sepsis, unspecified organism: Secondary | ICD-10-CM | POA: Diagnosis not present

## 2024-09-14 DIAGNOSIS — M47817 Spondylosis without myelopathy or radiculopathy, lumbosacral region: Secondary | ICD-10-CM | POA: Diagnosis not present

## 2024-09-14 DIAGNOSIS — M47816 Spondylosis without myelopathy or radiculopathy, lumbar region: Secondary | ICD-10-CM | POA: Diagnosis not present

## 2024-09-14 HISTORY — PX: PORT-A-CATH REMOVAL: SHX5289

## 2024-09-14 LAB — BASIC METABOLIC PANEL WITH GFR
Anion gap: 10 (ref 5–15)
BUN: 8 mg/dL (ref 8–23)
CO2: 26 mmol/L (ref 22–32)
Calcium: 8.1 mg/dL — ABNORMAL LOW (ref 8.9–10.3)
Chloride: 102 mmol/L (ref 98–111)
Creatinine, Ser: 0.54 mg/dL (ref 0.44–1.00)
GFR, Estimated: >60 mL/min (ref >60)
Glucose, Bld: 120 mg/dL — ABNORMAL HIGH (ref 70–99)
Potassium: 3.4 mmol/L — ABNORMAL LOW (ref 3.5–5.1)
Sodium: 138 mmol/L (ref 135–145)

## 2024-09-14 LAB — SURGICAL PCR SCREEN
MRSA, PCR: NEGATIVE
Staphylococcus aureus: NEGATIVE

## 2024-09-14 LAB — MAGNESIUM: Magnesium: 2.2 mg/dL (ref 1.7–2.4)

## 2024-09-14 SURGERY — REMOVAL PORT-A-CATH
Anesthesia: Monitor Anesthesia Care | Site: Chest

## 2024-09-14 MED ORDER — BUPIVACAINE-EPINEPHRINE 0.25% -1:200000 IJ SOLN
INTRAMUSCULAR | Status: DC | PRN
  Administered 2024-09-14: 9 mL

## 2024-09-14 MED ORDER — PROPOFOL 500 MG/50ML IV EMUL
INTRAVENOUS | Status: DC | PRN
  Administered 2024-09-14: 125 ug/kg/min via INTRAVENOUS

## 2024-09-14 MED ORDER — MUPIROCIN 2 % EX OINT
1.0000 | TOPICAL_OINTMENT | Freq: Two times a day (BID) | CUTANEOUS | Status: DC
  Administered 2024-09-14 – 2024-09-18 (×8): 1 via NASAL
  Filled 2024-09-14 (×2): qty 22

## 2024-09-14 MED ORDER — POTASSIUM CHLORIDE CRYS ER 20 MEQ PO TBCR
40.0000 meq | EXTENDED_RELEASE_TABLET | Freq: Once | ORAL | Status: AC
  Administered 2024-09-14: 40 meq via ORAL
  Filled 2024-09-14: qty 2

## 2024-09-14 MED ORDER — DEXAMETHASONE SOD PHOSPHATE PF 10 MG/ML IJ SOLN
INTRAMUSCULAR | Status: DC | PRN
  Administered 2024-09-14: 10 mg via INTRAVENOUS

## 2024-09-14 MED ORDER — SCOPOLAMINE 1 MG/3DAYS TD PT72
1.0000 | MEDICATED_PATCH | TRANSDERMAL | Status: DC
  Administered 2024-09-14: 1 mg via TRANSDERMAL
  Filled 2024-09-14: qty 1

## 2024-09-14 MED ORDER — 0.9 % SODIUM CHLORIDE (POUR BTL) OPTIME
TOPICAL | Status: DC | PRN
  Administered 2024-09-14: 1000 mL

## 2024-09-14 MED ORDER — LIDOCAINE 2% (20 MG/ML) 5 ML SYRINGE
INTRAMUSCULAR | Status: DC | PRN
  Administered 2024-09-14: 80 mg via INTRAVENOUS

## 2024-09-14 MED ORDER — BUPIVACAINE-EPINEPHRINE (PF) 0.25% -1:200000 IJ SOLN
INTRAMUSCULAR | Status: AC
  Filled 2024-09-14: qty 30

## 2024-09-14 MED ORDER — LIDOCAINE HCL (PF) 2 % IJ SOLN
INTRAMUSCULAR | Status: AC
  Filled 2024-09-14: qty 5

## 2024-09-14 MED ORDER — IPRATROPIUM-ALBUTEROL 0.5-2.5 (3) MG/3ML IN SOLN: 3.0000 mL | Freq: Once | RESPIRATORY_TRACT | Status: DC

## 2024-09-14 MED ORDER — OXYCODONE HCL 5 MG PO TABS: 5.0000 mg | ORAL_TABLET | Freq: Once | ORAL | Status: DC | PRN

## 2024-09-14 MED ORDER — SCOPOLAMINE 1 MG/3DAYS TD PT72
MEDICATED_PATCH | TRANSDERMAL | Status: AC
Start: 2024-09-14 — End: 2024-09-14
  Filled 2024-09-14: qty 1

## 2024-09-14 MED ORDER — MIDAZOLAM HCL (PF) 2 MG/2ML IJ SOLN: 0.5000 mg | Freq: Once | INTRAMUSCULAR | Status: DC | PRN

## 2024-09-14 MED ORDER — ZOLPIDEM TARTRATE 5 MG PO TABS
5.0000 mg | ORAL_TABLET | Freq: Every evening | ORAL | Status: DC | PRN
  Administered 2024-09-14 – 2024-09-17 (×4): 5 mg via ORAL
  Filled 2024-09-14 (×4): qty 1

## 2024-09-14 MED ORDER — MEPERIDINE HCL 25 MG/ML IJ SOLN: 6.2500 mg | INTRAMUSCULAR | Status: DC | PRN

## 2024-09-14 MED ORDER — ORAL CARE MOUTH RINSE: 15.0000 mL | OROMUCOSAL | Status: DC | PRN

## 2024-09-14 MED ORDER — OXYCODONE HCL 5 MG/5ML PO SOLN: 5.0000 mg | Freq: Once | ORAL | Status: DC | PRN

## 2024-09-14 MED ORDER — PROPOFOL 10 MG/ML IV BOLUS
INTRAVENOUS | Status: DC | PRN
Start: 2024-09-14 — End: 2024-09-14
  Administered 2024-09-14 (×5): 20 mg via INTRAVENOUS

## 2024-09-14 MED ORDER — PROPOFOL 500 MG/50ML IV EMUL
INTRAVENOUS | Status: AC
Start: 2024-09-14 — End: 2024-09-14
  Filled 2024-09-14: qty 50

## 2024-09-14 MED ORDER — LACTATED RINGERS IV SOLN: INTRAVENOUS | Status: DC | PRN

## 2024-09-14 MED ORDER — HYDROMORPHONE HCL 1 MG/ML IJ SOLN: 0.2500 mg | INTRAMUSCULAR | Status: DC | PRN

## 2024-09-14 MED ORDER — ACETAMINOPHEN 10 MG/ML IV SOLN
INTRAVENOUS | Status: DC | PRN
  Administered 2024-09-14: 1000 mg via INTRAVENOUS

## 2024-09-14 MED ORDER — IPRATROPIUM-ALBUTEROL 0.5-2.5 (3) MG/3ML IN SOLN
RESPIRATORY_TRACT | Status: AC
  Filled 2024-09-14: qty 3

## 2024-09-14 MED ORDER — GADOBUTROL 1 MMOL/ML IV SOLN
9.0000 mL | Freq: Once | INTRAVENOUS | Status: AC | PRN
  Administered 2024-09-14: 9 mL via INTRAVENOUS

## 2024-09-14 MED ORDER — ACETAMINOPHEN 10 MG/ML IV SOLN
INTRAVENOUS | Status: AC
  Filled 2024-09-14: qty 100

## 2024-09-14 MED ORDER — ONDANSETRON HCL 4 MG/2ML IJ SOLN
INTRAMUSCULAR | Status: DC | PRN
  Administered 2024-09-14: 4 mg via INTRAVENOUS

## 2024-09-14 SURGICAL SUPPLY — 26 items
BAG COUNTER SPONGE SURGICOUNT (BAG) IMPLANT
BLADE SURG 15 STRL LF DISP TIS (BLADE) ×2 IMPLANT
COVER SURGICAL LIGHT HANDLE (MISCELLANEOUS) ×2 IMPLANT
DRAIN PENROSE 0.25X18 (DRAIN) IMPLANT
DRAPE LAPAROTOMY TRNSV 102X78 (DRAPES) ×2 IMPLANT
DRAPE UTILITY XL STRL (DRAPES) ×2 IMPLANT
ELECT REM PT RETURN 15FT ADLT (MISCELLANEOUS) ×2 IMPLANT
GAUZE 4X4 16PLY ~~LOC~~+RFID DBL (SPONGE) ×2 IMPLANT
GAUZE SPONGE 4X4 12PLY STRL (GAUZE/BANDAGES/DRESSINGS) ×2 IMPLANT
GLOVE BIO SURGEON STRL SZ7 (GLOVE) ×2 IMPLANT
GLOVE BIOGEL PI IND STRL 7.0 (GLOVE) ×2 IMPLANT
GLOVE BIOGEL PI IND STRL 7.5 (GLOVE) ×2 IMPLANT
GOWN STRL REUS W/ TWL LRG LVL3 (GOWN DISPOSABLE) ×4 IMPLANT
KIT BASIN OR (CUSTOM PROCEDURE TRAY) ×2 IMPLANT
KIT TURNOVER KIT A (KITS) ×2 IMPLANT
NDL HYPO 25X1 1.5 SAFETY (NEEDLE) ×2 IMPLANT
NEEDLE HYPO 25X1 1.5 SAFETY (NEEDLE) ×1 IMPLANT
PACK BASIC VI WITH GOWN DISP (CUSTOM PROCEDURE TRAY) ×2 IMPLANT
PENCIL SMOKE EVACUATOR (MISCELLANEOUS) IMPLANT
SPIKE FLUID TRANSFER (MISCELLANEOUS) ×2 IMPLANT
STRIP CLOSURE SKIN 1/2X4 (GAUZE/BANDAGES/DRESSINGS) ×2 IMPLANT
SUT ETHILON 2 0 PS N (SUTURE) IMPLANT
SUT MNCRL AB 4-0 PS2 18 (SUTURE) ×2 IMPLANT
SYR CONTROL 10ML LL (SYRINGE) ×2 IMPLANT
TOWEL OR DSP ST BLU DLX 10/PK (DISPOSABLE) ×2 IMPLANT
WATER STERILE IRR 1000ML POUR (IV SOLUTION) ×2 IMPLANT

## 2024-09-14 NOTE — Consult Note (Signed)
 Reason for Consult:Bacteremia; infected port Referring Physician: Alekh, TRH  Mary Mendez is an 63 y.o. female.  HPI: This is a 63 year old female with a recent diagnosis of left breast cancer who is status post lumpectomy and sentinel lymph node biopsy.  She has also had an oncoplastic reduction by plastic surgery.  Oncotype came back as high risk.  On 08/18/2024, she underwent port placement.  She has been begun chemotherapy.  After her first treatment, she began having soreness around her port.  She became febrile.  Blood cultures are positive for Staph aureus.  She has been seen by infectious disease.  They recommend port removal due to bacteremia.  Past Medical History:  Diagnosis Date   Anxiety    Asthma    Bronchitis    Cancer (HCC) 06/2024   Left breast IDC   COVID-19 08/2021   Essential hypertension 09/11/2024   Hyperlipidemia    Hypertension    Mild intermittent asthma 09/11/2024   Pneumonia     Past Surgical History:  Procedure Laterality Date   ABDOMINAL HYSTERECTOMY  2010   and left ovaries;   BREAST BIOPSY     BREAST BIOPSY Left 06/04/2024   MM LT BREAST BX W LOC DEV 1ST LESION IMAGE BX SPEC STEREO GUIDE 06/04/2024 [facility]   BREAST BIOPSY Left 06/04/2024   MM LT BREAST BX W LOC DEV EA AD LESION IMG BX SPEC STEREO GUIDE 06/04/2024 [facility]   BREAST BIOPSY  07/15/2024   MM LT RADIOACTIVE SEED LOC MAMMO GUIDE 07/15/2024 [facility]   BREAST BIOPSY  07/15/2024   MM LT RADIOACTIVE SEED EA ADD LESION LOC MAMMO GUIDE 07/15/2024 [facility]   BREAST BIOPSY  07/15/2024   MM LT RADIOACTIVE SEED EA ADD LESION LOC MAMMO GUIDE 07/15/2024 [facility]   BREAST BIOPSY  07/15/2024   MM LT RADIOACTIVE SEED EA ADD LESION LOC MAMMO GUIDE 07/15/2024 [facility]   BREAST EXCISIONAL BIOPSY Left    BREAST LUMPECTOMY WITH RADIOACTIVE SEED AND SENTINEL LYMPH NODE BIOPSY Left 07/17/2024   Procedure: BREAST LUMPECTOMY WITH RADIOACTIVE SEED AND  SENTINEL LYMPH NODE BIOPSY;  Surgeon: Vernetta Berg, MD;  Location: [facility];  Service: General;  Laterality: Left;  RADIOACTIVE SEED GUIDED LEFT BREAST BRACKETED LUMPECTOMY AND SENTINEL NODE BIOPSY   BREAST REDUCTION SURGERY Bilateral 07/25/2024   Procedure: MAMMOPLASTY, REDUCTION;  Surgeon: Arelia Filippo, MD;  Location: [facility];  Service: Plastics;  Laterality: Bilateral;   BREAST SURGERY     CHOLECYSTECTOMY N/A 02/18/2022   Procedure: LAPAROSCOPIC CHOLECYSTECTOMY;  Surgeon: Signe Mitzie DELENA, MD;  Location: [facility];  Service: General;  Laterality: N/A;   EXCISION, MASS, UPPER EXTREMITY Left 07/17/2024   Procedure: EXCISION, MASS, UPPER EXTREMITY;  Surgeon: Vernetta Berg, MD;  Location: [facility];  Service: General;  Laterality: Left;  EXCISION LEFT SHOULDER MASS   HYSTEROTOMY     PORTACATH PLACEMENT N/A 08/18/2024   Procedure: INSERTION, TUNNELED CENTRAL VENOUS DEVICE, WITH PORT;  Surgeon: Vernetta Berg, MD;  Location: [facility];  Service: General;  Laterality: N/A;  PORT PLACEMENT WITH ULTRASOUND GUIDANCE    Family History  Problem Relation Age of Onset   Breast cancer Mother 81   Emphysema Father    Stroke Father    Stroke Sister    Asthma Sister    Leukemia Maternal Aunt 78 - 79   Asthma Paternal Uncle    Colon cancer Neg Hx    Colon polyps Neg Hx    Esophageal cancer  Neg Hx    Rectal cancer Neg Hx    Stomach cancer Neg Hx     Social History:  reports that she has never smoked. She has never been exposed to tobacco smoke. She has never used smokeless tobacco. She reports current alcohol use. She reports that she does not use drugs.  Allergies: No Known Allergies  Medications:  Prior to Admission medications   Medication Sig Start Date End Date Taking? Authorizing Provider  acetaminophen  (TYLENOL ) 500 MG tablet Take 1,000 mg by mouth every 6 (six) hours as needed for mild pain.   Yes [provider]  albuterol  (VENTOLIN  HFA)  108 (90 Base) MCG/ACT inhaler Inhale into the lungs. 08/22/24  Yes [provider]  amLODipine (NORVASC) 10 MG tablet Take 10 mg by mouth daily. 08/25/24  Yes [provider]  busPIRone (BUSPAR) 10 MG tablet Take 10 mg by mouth 3 (three) times daily as needed (Anxiety). 10/01/22  Yes [provider]  dexamethasone  (DECADRON ) 4 MG tablet Take 2 tabs by mouth 2 times daily starting day before chemo. Then take 2 tabs daily for 2 days starting day after chemo. Take with food. 08/21/24  Yes Iruku, Praveena, MD  fluticasone-salmeterol (ADVAIR) 250-50 MCG/ACT AEPB Inhale 1 puff into the lungs 2 (two) times daily. 08/22/24  Yes [provider]  lidocaine -prilocaine (EMLA) cream Apply to affected area once 08/21/24  Yes Iruku, Praveena, MD  loratadine (CLARITIN) 10 MG tablet Take 10 mg by mouth daily.   Yes [provider]  montelukast (SINGULAIR) 10 MG tablet Take 10 mg by mouth daily. 08/25/24  Yes [provider]  ondansetron  (ZOFRAN ) 8 MG tablet Take 1 tablet (8 mg total) by mouth every 8 (eight) hours as needed for nausea or vomiting. Start on the third day after chemotherapy. 08/21/24  Yes Iruku, Praveena, MD  oxyCODONE  (OXY IR/ROXICODONE ) 5 MG immediate release tablet Take 1 tablet (5 mg total) by mouth every 6 (six) hours as needed for up to 7 days for severe pain (pain score 7-10). 09/08/24 09/15/24 Yes Walisiewicz, Kaitlyn E, PA-C  prochlorperazine  (COMPAZINE ) 10 MG tablet Take 1 tablet (10 mg total) by mouth every 6 (six) hours as needed for nausea or vomiting. 08/21/24  Yes Iruku, Praveena, MD  rosuvastatin (CRESTOR) 10 MG tablet Take 10 mg by mouth daily.   Yes [provider]  tretinoin (RETIN-A) 0.1 % cream Apply 1 application  topically at bedtime. 09/25/22  Yes [provider]  nystatin (MYCOSTATIN/NYSTOP) powder Apply 1 Application topically 3 (three) times daily. 09/03/24   Iruku, Praveena, MD     Results for orders  placed or performed during the hospital encounter of 09/10/24 (from the past 48 hours)  Culture, blood (Routine X 2) w Reflex to ID Panel     Status: None (Preliminary result)   Collection Time: 09/13/24 12:52 AM   Specimen: BLOOD RIGHT ARM  Result Value Ref Range   Specimen Description      BLOOD RIGHT ARM Performed at [facility], [address], [place], [place] [address]    Special Requests      BOTTLES DRAWN AEROBIC ONLY Blood Culture adequate volume Performed at [facility], [address], [place], [place] [address]    Culture      NO GROWTH < 12 HOURS Performed at [facility], [address], [place], [place] [address]    Report Status PENDING   Culture, blood (Routine X 2) w Reflex to  ID Panel     Status: None (Preliminary result)   Collection Time: 09/13/24 12:52 AM   Specimen: BLOOD RIGHT ARM  Result Value Ref Range   Specimen Description      BLOOD RIGHT ARM Performed at [facility], [address] [place], [place] [address]    Special Requests      BOTTLES DRAWN AEROBIC ONLY Blood Culture adequate volume Performed at [facility], [address], [place], [place] [address]    Culture      NO GROWTH < 12 HOURS Performed at [facility], [address] [place], [place] [address]    Report Status PENDING   Comprehensive metabolic panel with GFR     Status: Abnormal   Collection Time: 09/13/24 12:52 AM  Result Value Ref Range   Sodium 137 135 - 145 mmol/L   Potassium 3.0 (L) 3.5 - 5.1 mmol/L   Chloride 103 98 - 111 mmol/L   CO2 23 22 - 32 mmol/L   Glucose, Bld 123 (H) 70 - 99 mg/dL    Comment: Glucose reference range applies only to samples taken after fasting for at least 8 hours.   BUN 7 (L) 8 - 23 mg/dL   Creatinine, Ser 9.43 0.44 - 1.00 mg/dL   Calcium 8.2 (L) 8.9 - 10.3 mg/dL   Total Protein 5.5 (L) 6.5 - 8.1 g/dL   Albumin 2.9 (L) 3.5 - 5.0 g/dL   AST 39 15 - 41 U/L   ALT 35 0 - 44 U/L    Alkaline Phosphatase 103 38 - 126 U/L   Total Bilirubin 0.4 0.0 - 1.2 mg/dL   GFR, Estimated >39 >39 mL/min    Comment: (NOTE) Calculated using the CKD-EPI Creatinine Equation (2021)    Anion gap 11 5 - 15    Comment: Performed at [facility], [address], [place], [place] [address]  Magnesium     Status: None   Collection Time: 09/13/24 12:52 AM  Result Value Ref Range   Magnesium 2.3 1.7 - 2.4 mg/dL    Comment: Performed at [facility], [address], [place], [place] [address]  CBC with Differential/Platelet     Status: Abnormal   Collection Time: 09/13/24 12:52 AM  Result Value Ref Range   WBC 9.3 4.0 - 10.5 K/uL   RBC 3.90 3.87 - 5.11 MIL/uL   Hemoglobin 11.0 (L) 12.0 - 15.0 g/dL   HCT 66.6 (L) 63.9 - 53.9 %   MCV 85.4 80.0 - 100.0 fL   MCH 28.2 26.0 - 34.0 pg   MCHC 33.0 30.0 - 36.0 g/dL   RDW 84.6 88.4 - 84.4 %   Platelets 149 (L) 150 - 400 K/uL   nRBC 0.0 0.0 - 0.2 %   Neutrophils Relative % 68 %   Neutro Abs 6.3 1.7 - 7.7 K/uL   Lymphocytes Relative 10 %   Lymphs Abs 0.9 0.7 - 4.0 K/uL   Monocytes Relative 9 %   Monocytes Absolute 0.8 0.1 - 1.0 K/uL   Eosinophils Relative 0 %   Eosinophils Absolute 0.0 0.0 - 0.5 K/uL   Basophils Relative 0 %   Basophils Absolute 0.0 0.0 - 0.1 K/uL   Immature Granulocytes 13 %   Abs Immature Granulocytes 1.20 (H) 0.00 - 0.07 K/uL    Comment: Performed at [facility], [address], [place], [place] [address]  Surgical PCR screen     Status: None  Collection Time: 09/14/24  1:35 AM   Specimen: Nasal Mucosa; Nasal Swab  Result Value Ref Range   MRSA, PCR NEGATIVE NEGATIVE   Staphylococcus aureus NEGATIVE NEGATIVE    Comment: (NOTE) The Xpert SA Assay (FDA approved for NASAL specimens in patients 40 years of age and older), is one component of a comprehensive surveillance program. It is not intended to diagnose infection nor to guide or monitor  treatment. Performed at [facility], [address] [place], [place] [address]   Basic metabolic panel with GFR     Status: Abnormal   Collection Time: 09/14/24  5:35 AM  Result Value Ref Range   Sodium 138 135 - 145 mmol/L   Potassium 3.4 (L) 3.5 - 5.1 mmol/L   Chloride 102 98 - 111 mmol/L   CO2 26 22 - 32 mmol/L   Glucose, Bld 120 (H) 70 - 99 mg/dL    Comment: Glucose reference range applies only to samples taken after fasting for at least 8 hours.   BUN 8 8 - 23 mg/dL   Creatinine, Ser 9.45 0.44 - 1.00 mg/dL   Calcium 8.1 (L) 8.9 - 10.3 mg/dL   GFR, Estimated >39 >39 mL/min    Comment: (NOTE) Calculated using the CKD-EPI Creatinine Equation (2021)    Anion gap 10 5 - 15    Comment: Performed at [facility], [address], [place], [place] [address]  Magnesium     Status: None   Collection Time: 09/14/24  5:35 AM  Result Value Ref Range   Magnesium 2.2 1.7 - 2.4 mg/dL    Comment: Performed at [facility], [address], [place], [place] [address]    ECHOCARDIOGRAM COMPLETE Result Date: 09/13/2024    ECHOCARDIOGRAM REPORT   Patient Name:   [facility] Villan[facility] Date of Exam: 09/13/2024 Medical Rec #:  [id number]       Height:       65.0 in Accession #:    [id number]      Weight:       218.0 lb Date of Birth:  1961/04/16        BSA:          2.052 m Patient Age:    63 years        BP:           111/70 mmHg Patient Gender: F               HR:           83 bpm. Exam Location:  Inpatient Procedure: 2D Echo and Strain Analysis (Both Spectral and Color Flow Doppler            were utilized during procedure). Indications:    Bacteremia  History:        Patient has no prior history of Echocardiogram examinations.  Sonographer:    Charmaine Gaskins Referring Phys: [id number] [facility] IMPRESSIONS  1. Left ventricular ejection fraction, by estimation, is 60 to 65%. The left ventricle has normal function. The left ventricle has no regional  wall motion abnormalities. There is mild left ventricular hypertrophy. Left ventricular diastolic parameters were normal. The average left ventricular global longitudinal strain is -21.3 %. The global longitudinal strain is normal.  2. Right ventricular systolic function is normal. The right ventricular size is normal. There is normal pulmonary artery systolic pressure. The estimated right ventricular systolic pressure is 31.3 mmHg.  3. The mitral valve is normal in structure.  Trivial mitral valve regurgitation. No evidence of mitral stenosis.  4. The aortic valve is tricuspid. Aortic valve regurgitation is not visualized. Aortic valve sclerosis is present, with no evidence of aortic valve stenosis.  5. The inferior vena cava is normal in size with greater than 50% respiratory variability, suggesting right atrial pressure of 3 mmHg. Conclusion(s)/Recommendation(s): No evidence of valvular vegetations on this transthoracic echocardiogram. Consider a transesophageal echocardiogram to exclude infective endocarditis if clinically indicated. FINDINGS  Left Ventricle: Left ventricular ejection fraction, by estimation, is 60 to 65%. The left ventricle has normal function. The left ventricle has no regional wall motion abnormalities. The average left ventricular global longitudinal strain is -21.3 %. Strain was performed and the global longitudinal strain is normal. The left ventricular internal cavity size was normal in size. There is mild left ventricular hypertrophy. Left ventricular diastolic parameters were normal. Right Ventricle: The right ventricular size is normal. No increase in right ventricular wall thickness. Right ventricular systolic function is normal. There is normal pulmonary artery systolic pressure. The tricuspid regurgitant velocity is 2.66 m/s, and  with an assumed right atrial pressure of 3 mmHg, the estimated right ventricular systolic pressure is 31.3 mmHg. Left Atrium: Left atrial size was normal  in size. Right Atrium: Right atrial size was normal in size. Pericardium: Trivial pericardial effusion is present. Mitral Valve: The mitral valve is normal in structure. Trivial mitral valve regurgitation. No evidence of mitral valve stenosis. Tricuspid Valve: The tricuspid valve is normal in structure. Tricuspid valve regurgitation is trivial. Aortic Valve: The aortic valve is tricuspid. Aortic valve regurgitation is not visualized. Aortic valve sclerosis is present, with no evidence of aortic valve stenosis. Aortic valve mean gradient measures 8.0 mmHg. Aortic valve peak gradient measures 14.4 mmHg. Aortic valve area, by VTI measures 2.20 cm. Pulmonic Valve: The pulmonic valve was not well visualized. Pulmonic valve regurgitation is trivial. Aorta: The aortic root and ascending aorta are structurally normal, with no evidence of dilitation. Venous: The inferior vena cava is normal in size with greater than 50% respiratory variability, suggesting right atrial pressure of 3 mmHg. IAS/Shunts: The interatrial septum was not well visualized.  LEFT VENTRICLE PLAX 2D LVIDd:         5.30 cm   Diastology LVIDs:         3.60 cm   LV e' medial:    8.81 cm/s LV PW:         1.00 cm   LV E/e' medial:  11.5 LV IVS:        1.00 cm   LV e' lateral:   9.46 cm/s LVOT diam:     2.00 cm   LV E/e' lateral: 10.7 LV SV:         74 LV SV Index:   36        2D Longitudinal Strain LVOT Area:     3.14 cm  2D Strain GLS Avg:     -21.3 %  RIGHT VENTRICLE RV Basal diam:  3.10 cm RV Mid diam:    3.10 cm RV S prime:     16.60 cm/s LEFT ATRIUM             Index        RIGHT ATRIUM           Index LA diam:        4.00 cm 1.95 cm/m   RA Area:     17.50 cm LA Vol (A2C):   48.0 ml 23.39 ml/m  RA Volume:   45.40 ml  22.12 ml/m LA Vol (A4C):   51.1 ml 24.90 ml/m LA Biplane Vol: 53.6 ml 26.12 ml/m  AORTIC VALVE AV Area (Vmax):    2.33 cm AV Area (Vmean):   2.23 cm AV Area (VTI):     2.20 cm AV Vmax:           190.00 cm/s AV Vmean:           131.000 cm/s AV VTI:            0.335 m AV Peak Grad:      14.4 mmHg AV Mean Grad:      8.0 mmHg LVOT Vmax:         141.00 cm/s LVOT Vmean:        93.100 cm/s LVOT VTI:          0.235 m LVOT/AV VTI ratio: 0.70  AORTA Ao Root diam: 2.80 cm Ao Asc diam:  3.20 cm MITRAL VALVE                TRICUSPID VALVE MV Area (PHT): 3.89 cm     TR Peak grad:   28.3 mmHg MV Decel Time: 195 msec     TR Vmax:        266.00 cm/s MV E velocity: 101.00 cm/s MV A velocity: 93.50 cm/s   SHUNTS MV E/A ratio:  1.08         Systemic VTI:  0.24 m                             Systemic Diam: 2.00 cm Lonni Nanas MD Electronically signed by Lonni Nanas MD Signature Date/Time: 09/13/2024/2:42:12 PM    Final      Blood pressure 123/71, pulse 90, temperature 99.1 F (37.3 C), temperature source Oral, resp. rate 20, height 5' 5 (1.651 m), weight 98.9 kg, SpO2 96%. Physical Exam Right chest port site - mild erythema; tender; no obvious fluctuance  Assessment/Plan: Left breast cancer Bacteremia and possible port infection  Recommend port removal today.  I explained to the patient that if we see gross purulence, we would not be able to close the wound completely.The surgical procedure has been discussed with the patient.  Potential risks, benefits, alternative treatments, and expected outcomes have been explained.  All of the patient's questions at this time have been answered.  The likelihood of reaching the patient's treatment goal is good.  The patient understand the proposed surgical procedure and wishes to proceed.   Mary Mendez Olisa Quesnel 09/14/2024, 7:19 AM

## 2024-09-14 NOTE — Transfer of Care (Signed)
 Immediate Anesthesia Transfer of Care Note  Patient: Kingsley Herandez Goncalves  Procedure(s) Performed: REMOVAL PORT-A-CATH (Chest)  Patient Location: PACU  Anesthesia Type:MAC  Level of Consciousness: awake, alert , oriented, and patient cooperative  Airway & Oxygen Therapy: Patient Spontanous Breathing and Patient connected to face mask oxygen  Post-op Assessment: Report given to RN and Post -op Vital signs reviewed and stable  Post vital signs: Reviewed and stable  Last Vitals:  Vitals Value Taken Time  BP 100/63 09/14/24 09:36  Temp    Pulse 81 09/14/24 09:37  Resp 24 09/14/24 09:37  SpO2 100 % 09/14/24 09:37  Vitals shown include unfiled device data.  Last Pain:  Vitals:   09/14/24 0751  TempSrc:   PainSc: 0-No pain      Patients Stated Pain Goal: 0 (09/13/24 1052)  Complications: No notable events documented.

## 2024-09-14 NOTE — Anesthesia Postprocedure Evaluation (Signed)
 Anesthesia Post Note  Patient: Mary Mendez  Procedure(s) Performed: REMOVAL PORT-A-CATH (Chest)     Patient location during evaluation: PACU Anesthesia Type: MAC Level of consciousness: awake and alert, patient cooperative and oriented Pain management: pain level controlled Vital Signs Assessment: post-procedure vital signs reviewed and stable Respiratory status: spontaneous breathing, nonlabored ventilation and respiratory function stable Cardiovascular status: stable and blood pressure returned to baseline Postop Assessment: no apparent nausea or vomiting and adequate PO intake Anesthetic complications: no   No notable events documented.  Last Vitals:  Vitals:   09/14/24 0937 09/14/24 0945  BP: 100/63 102/62  Pulse: 81 80  Resp: (!) 24 17  Temp: (!) 38.1 C   SpO2: 100% 100%    Last Pain:  Vitals:   09/14/24 0945  TempSrc:   PainSc: 0-No pain                 Benino Korinek,E. Destany Severns

## 2024-09-14 NOTE — Op Note (Signed)
 Pre-op diagnosis: Bacteremia and infected port Postop diagnosis: Same Procedure performed: Port removal Surgeon:Harveer Sadler K Calaya Gildner Anesthesia: Local MAC Indications:This is a 63 year old female with a recent diagnosis of left breast cancer who is status post lumpectomy and sentinel lymph node biopsy.  She has also had an oncoplastic reduction by plastic surgery.  Oncotype came back as high risk.  On 08/18/2024, she underwent port placement.  She has been begun chemotherapy.  After her first treatment, she began having soreness around her port.  She became febrile.  Blood cultures are positive for Staph aureus.  She has been seen by infectious disease.  They recommend port removal due to bacteremia.   Operative findings:.  Approximately 20 cc of purulent appearing fluid within the port pocket.  CASE DATA: Type of patient?: LDOW CASE (Surgical Hospitalist [facility] Inpatient) Status of Case? URGENT Add On Infection Present At Time Of Surgery (PATOS)?  PURULENCE  Description of procedure: The patient is brought to the operating room placed in the supine position on the operating room table with her right arm down by her side.  She was given some intravenous sedation.  Her right chest was prepped with ChloraPrep and draped sterile fashion.  Timeout was taken to ensure the proper patient and proper procedure.  I infiltrated local anesthetic over the port site.  I opened her previous incision.  Cautery was used to dissect down to the port.  We encountered approximately 20 cc of a purulent appearing fluid that was fully evacuated.  We cultured some of this fluid and sent it to microbiology.  We remove the Prolene suture holding the port in place.  The port was then removed.  Direct pressure was held against the insertion site on the right side of her neck for several minutes.  There is no sign of bleeding.  We irrigated the cavity thoroughly and inspected for hemostasis.  A 1/4 inch Penrose drain was then placed into  the the wound.  This was secured with a 3-0 Ethilon suture.  The wound was loosely closed with interrupted 3-0 Ethilon sutures.  A dry dressing was applied.  The patient is then awakened and brought to the recovery room in stable condition.  All sponge, instrument, and needle counts are correct.  Donnice POUR. Belinda, MD, [facility]  General Surgery   09/14/2024 9:32 AM

## 2024-09-14 NOTE — Anesthesia Procedure Notes (Signed)
 Procedure Name: MAC Date/Time: 09/14/2024 9:11 AM  Performed by: Dasie Nena PARAS, CRNAPre-anesthesia Checklist: Patient identified, Emergency Drugs available, Suction available, Patient being monitored and Timeout performed Oxygen Delivery Method: Simple face mask Placement Confirmation: positive ETCO2

## 2024-09-14 NOTE — Progress Notes (Addendum)
 PROGRESS NOTE    Mary Mendez  FMW:[id number] DOB: Apr 16, 1961 DOA: 09/10/2024 PCP: [name], MD   Brief Narrative:  63 year old female with history of left breast cancer, asthma, hyperlipidemia, hypertension, anxiety presented with fever and blood culture growing MSSA.  On presentation, WBC was 4.3, COVID/influenza/RSV PCR negative.  AST of 53 and ALT of 54.  Chest x-ray showed mild central vascular congestion with interstitial edema.  CT L-spine showed mild degenerative changes of the visualized thoracolumbar spine.  CT chest/abdomen/pelvis with contrast showed mild subcutaneous stranding along the right chest port, correlate for cellulitis; new 8 mm right lower lobe pulmonary nodule.  She was started on IV antibiotics.  ID was consulted.  Assessment & Plan:   MSSA bacteremia Sepsis: Present on admission -Presented with fever, tachypnea with MSSA bacteremia and Port-A-Cath site infection. - Possibly related to Port-A-Cath infection.  Continue IV cefazolin .  2D echo showed EF of 60 to 65% with no vegetations.  MRI lumbar spine is pending.  General surgery is planned to remove Port-A-Cath today.  Follow repeat blood cultures from 09/13/2024: Negative so far.  Left breast cancer -Outpatient follow-up with oncology  Essential hypertension Hyperlipidemia - Continue amlodipine and statin  Hyponatremia - Resolved  Hyperkalemia - Resolved  Hypokalemia - Replace.  Repeat a.m. labs  Thrombocytopenia - Mild.  No signs of bleeding.  Monitor intermittently  Mildly elevated LFTs - Resolved  Obesity class II - Outpatient follow-up  Mild intermittent asthma - Continue loratadine and montelukast.  Continue as needed albuterol  nebulizer.  No longer using Advair   DVT prophylaxis: Lovenox Code Status: Full Family Communication: Daughter at bedside Disposition Plan: Status is: Inpatient Remains inpatient appropriate because: Of severity of illness    Consultants:  ID/general surgery  Procedures: 2D echo Antimicrobials: Cefazolin  from 09/11/2024 onwards   Subjective: Patient seen and examined at bedside.  No fever, vomiting, chest pain reported.   Objective: Vitals:   09/13/24 0606 09/13/24 1258 09/13/24 2046 09/14/24 0411  BP:  114/65 119/73 123/71  Pulse:  90 92 90  Resp:  16 20 20   Temp:  99.6 F (37.6 C) 98.7 F (37.1 C) 99.1 F (37.3 C)  TempSrc:  Oral Oral Oral  SpO2:  95% 95% 96%  Weight: 98.9 kg     Height:        Intake/Output Summary (Last 24 hours) at 09/14/2024 9196 Last data filed at 09/14/2024 0751 Gross per 24 hour  Intake 1096.61 ml  Output --  Net 1096.61 ml   Filed Weights   09/11/24 1254 09/13/24 0606  Weight: 99.8 kg 98.9 kg    Examination:  General: Remains on room air and in no distress  ENT/neck: No palpable neck masses or JVD elevation noted respiratory: Bilateral decreased breath sounds at bases with scattered crackles CVS: Rate mostly controlled; S1 and S2 are heard  abdominal: Soft, obese, nontender, distended mildly; no organomegaly, bowel sounds are heard normally Extremities: No clubbing; mild lower extremity edema present CNS: Alert and oriented.  No focal neurologic deficit.  Able to move extremities Lymph: No obvious palpable lymphadenopathy Skin: No obvious petechia/rashes psych: Mood, affect and judgment are normal  musculoskeletal: No obvious joint tenderness/erythema    Data Reviewed: I have personally reviewed following labs and imaging studies  CBC: Recent Labs  Lab 09/10/24 1855 09/12/24 0604 09/13/24 0052  WBC 4.3 8.2 9.3  NEUTROABS 2.4 4.8 6.3  HGB 13.2 12.7 11.0*  HCT 39.2 40.0 33.3*  MCV 84.1 88.7 85.4  PLT  196 144* 149*   Basic Metabolic Panel: Recent Labs  Lab 09/10/24 1855 09/12/24 0604 09/13/24 0052 09/14/24 0535  NA 132* 134* 137 138  K 5.5* 3.4* 3.0* 3.4*  CL 97* 101 103 102  CO2 23 21* 23 26  GLUCOSE 138* 99 123* 120*  BUN 12 8 7* 8  CREATININE  0.67 0.62 0.56 0.54  CALCIUM 8.8* 8.2* 8.2* 8.1*  MG  --   --  2.3 2.2   GFR: Estimated Creatinine Clearance: 86 mL/min (by C-G formula based on SCr of 0.54 mg/dL). Liver Function Tests: Recent Labs  Lab 09/10/24 1855 09/12/24 0604 09/13/24 0052  AST 53* 50* 39  ALT 54* 50* 35  ALKPHOS 109 105 103  BILITOT 0.7 0.5 0.4  PROT 6.5 6.0* 5.5*  ALBUMIN 3.8 3.1* 2.9*   No results for input(s): LIPASE, AMYLASE in the last 168 hours. No results for input(s): AMMONIA in the last 168 hours. Coagulation Profile: Recent Labs  Lab 09/10/24 1945  INR 0.9   Cardiac Enzymes: No results for input(s): CKTOTAL, CKMB, CKMBINDEX, TROPONINI in the last 168 hours. BNP (last 3 results) No results for input(s): PROBNP in the last 8760 hours. HbA1C: No results for input(s): HGBA1C in the last 72 hours. CBG: No results for input(s): GLUCAP in the last 168 hours. Lipid Profile: No results for input(s): CHOL, HDL, LDLCALC, TRIG, CHOLHDL, LDLDIRECT in the last 72 hours. Thyroid Function Tests: No results for input(s): TSH, T4TOTAL, FREET4, T3FREE, THYROIDAB in the last 72 hours. Anemia Panel: No results for input(s): VITAMINB12, FOLATE, FERRITIN, TIBC, IRON, RETICCTPCT in the last 72 hours. Sepsis Labs: Recent Labs  Lab 09/10/24 1855  LATICACIDVEN 1.5    Recent Results (from the past 240 hours)  Culture, blood (Routine x 2)     Status: Abnormal   Collection Time: 09/10/24  6:49 PM   Specimen: BLOOD  Result Value Ref Range Status   Specimen Description   Final    BLOOD RIGHT ANTECUBITAL Performed at [facility], [address] [place], [place] [address]    Special Requests   Final    Blood Culture adequate volume BOTTLES DRAWN AEROBIC AND ANAEROBIC Performed at [facility], [address], [place], [place] [address]    Culture  Setup Time   Final    GRAM POSITIVE COCCI IN CLUSTERS IN BOTH  AEROBIC AND ANAEROBIC BOTTLES CRITICAL RESULT CALLED TO, READ BACK BY AND VERIFIED WITH: PHARMD CRYSTAL ROBERTSON ON 09/11/24 @ 1320 BY DRT Performed at [facility], [address] [place], [place] [address]    Culture STAPHYLOCOCCUS AUREUS (A)  Final   Report Status 09/13/2024 FINAL  Final   Organism ID, Bacteria STAPHYLOCOCCUS AUREUS  Final      Susceptibility   Staphylococcus aureus - MIC*    CIPROFLOXACIN <=0.5 SENSITIVE Sensitive     ERYTHROMYCIN <=0.25 SENSITIVE Sensitive     GENTAMICIN <=0.5 SENSITIVE Sensitive     OXACILLIN <=0.25 SENSITIVE Sensitive     TETRACYCLINE <=1 SENSITIVE Sensitive     VANCOMYCIN <=0.5 SENSITIVE Sensitive     TRIMETH/SULFA <=10 SENSITIVE Sensitive     CLINDAMYCIN <=0.25 SENSITIVE Sensitive     RIFAMPIN <=0.5 SENSITIVE Sensitive     Inducible Clindamycin NEGATIVE Sensitive     LINEZOLID 1 SENSITIVE Sensitive     * STAPHYLOCOCCUS AUREUS  Blood Culture ID Panel (Reflexed)     Status: Abnormal   Collection Time: 09/10/24  6:49 PM  Result Value Ref Range Status  Enterococcus faecalis NOT DETECTED NOT DETECTED Final   Enterococcus Faecium NOT DETECTED NOT DETECTED Final   Listeria monocytogenes NOT DETECTED NOT DETECTED Final   Staphylococcus species DETECTED (A) NOT DETECTED Final    Comment: CRITICAL RESULT CALLED TO, READ BACK BY AND VERIFIED WITH: PHARMD CRYSTAL ROBERTSON ON 09/11/24 @ 1320 BY DRT    Staphylococcus aureus (BCID) DETECTED (A) NOT DETECTED Final    Comment: CRITICAL RESULT CALLED TO, READ BACK BY AND VERIFIED WITH: PHARMD CRYSTAL ROBERTSON ON 09/11/24 @ 1320 BY DRT    Staphylococcus epidermidis NOT DETECTED NOT DETECTED Final   Staphylococcus lugdunensis NOT DETECTED NOT DETECTED Final   Streptococcus species NOT DETECTED NOT DETECTED Final   Streptococcus agalactiae NOT DETECTED NOT DETECTED Final   Streptococcus pneumoniae NOT DETECTED NOT DETECTED Final   Streptococcus pyogenes NOT DETECTED NOT DETECTED Final    A.calcoaceticus-baumannii NOT DETECTED NOT DETECTED Final   Bacteroides fragilis NOT DETECTED NOT DETECTED Final   Enterobacterales NOT DETECTED NOT DETECTED Final   Enterobacter cloacae complex NOT DETECTED NOT DETECTED Final   Escherichia coli NOT DETECTED NOT DETECTED Final   Klebsiella aerogenes NOT DETECTED NOT DETECTED Final   Klebsiella oxytoca NOT DETECTED NOT DETECTED Final   Klebsiella pneumoniae NOT DETECTED NOT DETECTED Final   Proteus species NOT DETECTED NOT DETECTED Final   Salmonella species NOT DETECTED NOT DETECTED Final   Serratia marcescens NOT DETECTED NOT DETECTED Final   Haemophilus influenzae NOT DETECTED NOT DETECTED Final   Neisseria meningitidis NOT DETECTED NOT DETECTED Final   Pseudomonas aeruginosa NOT DETECTED NOT DETECTED Final   Stenotrophomonas maltophilia NOT DETECTED NOT DETECTED Final   Candida albicans NOT DETECTED NOT DETECTED Final   Candida auris NOT DETECTED NOT DETECTED Final   Candida glabrata NOT DETECTED NOT DETECTED Final   Candida krusei NOT DETECTED NOT DETECTED Final   Candida parapsilosis NOT DETECTED NOT DETECTED Final   Candida tropicalis NOT DETECTED NOT DETECTED Final   Cryptococcus neoformans/gattii NOT DETECTED NOT DETECTED Final   Meth resistant mecA/C and MREJ NOT DETECTED NOT DETECTED Final    Comment: Performed at [facility], [address] [place], [place] [address]  Resp panel by RT-PCR (RSV, Flu A&B, Covid) Anterior Nasal Swab     Status: None   Collection Time: 09/10/24  8:36 PM   Specimen: Anterior Nasal Swab  Result Value Ref Range Status   SARS Coronavirus 2 by RT PCR NEGATIVE NEGATIVE Final    Comment: (NOTE) SARS-CoV-2 target nucleic acids are NOT DETECTED.  The SARS-CoV-2 RNA is generally detectable in upper respiratory specimens during the acute phase of infection. The lowest concentration of SARS-CoV-2 viral copies this assay can detect is 138 copies/mL. A negative result does not preclude  SARS-Cov-2 infection and should not be used as the sole basis for treatment or other patient management decisions. A negative result may occur with  improper specimen collection/handling, submission of specimen other than nasopharyngeal swab, presence of viral mutation(s) within the areas targeted by this assay, and inadequate number of viral copies(<138 copies/mL). A negative result must be combined with clinical observations, patient history, and epidemiological information. The expected result is Negative.  Fact Sheet for Patients:  bloggercourse.com  Fact Sheet for Healthcare Providers:  seriousbroker.it  This test is no t yet approved or cleared by the [place]  FDA and  has been authorized for detection and/or diagnosis of SARS-CoV-2 by FDA under an Emergency Use Authorization (EUA). This EUA will remain  in effect (  meaning this test can be used) for the duration of the COVID-19 declaration under Section 564(b)(1) of the Act, 21 U.S.C.section 360bbb-3(b)(1), unless the authorization is terminated  or revoked sooner.       Influenza A by PCR NEGATIVE NEGATIVE Final   Influenza B by PCR NEGATIVE NEGATIVE Final    Comment: (NOTE) The Xpert Xpress SARS-CoV-2/FLU/RSV plus assay is intended as an aid in the diagnosis of influenza from Nasopharyngeal swab specimens and should not be used as a sole basis for treatment. Nasal washings and aspirates are unacceptable for Xpert Xpress SARS-CoV-2/FLU/RSV testing.  Fact Sheet for Patients: bloggercourse.com  Fact Sheet for Healthcare Providers: seriousbroker.it  This test is not yet approved or cleared by the [place]  FDA and has been authorized for detection and/or diagnosis of SARS-CoV-2 by FDA under an Emergency Use Authorization (EUA). This EUA will remain in effect (meaning this test can be used) for the duration of  the COVID-19 declaration under Section 564(b)(1) of the Act, 21 U.S.C. section 360bbb-3(b)(1), unless the authorization is terminated or revoked.     Resp Syncytial Virus by PCR NEGATIVE NEGATIVE Final    Comment: (NOTE) Fact Sheet for Patients: bloggercourse.com  Fact Sheet for Healthcare Providers: seriousbroker.it  This test is not yet approved or cleared by the [place]  FDA and has been authorized for detection and/or diagnosis of SARS-CoV-2 by FDA under an Emergency Use Authorization (EUA). This EUA will remain in effect (meaning this test can be used) for the duration of the COVID-19 declaration under Section 564(b)(1) of the Act, 21 U.S.C. section 360bbb-3(b)(1), unless the authorization is terminated or revoked.  Performed at [facility], [address] [place], [place] [address]   Culture, blood (Routine X 2) w Reflex to ID Panel     Status: None (Preliminary result)   Collection Time: 09/13/24 12:52 AM   Specimen: BLOOD RIGHT ARM  Result Value Ref Range Status   Specimen Description   Final    BLOOD RIGHT ARM Performed at [facility], [address], [place], [place] [address]    Special Requests   Final    BOTTLES DRAWN AEROBIC ONLY Blood Culture adequate volume Performed at [facility], [address] [place], [place] [address]    Culture   Final    NO GROWTH < 12 HOURS Performed at [facility], [address] [place], [place] [address]    Report Status PENDING  Incomplete  Culture, blood (Routine X 2) w Reflex to ID Panel     Status: None (Preliminary result)   Collection Time: 09/13/24 12:52 AM   Specimen: BLOOD RIGHT ARM  Result Value Ref Range Status   Specimen Description   Final    BLOOD RIGHT ARM Performed at [facility], [address] [place], [place] [address]    Special Requests   Final    BOTTLES DRAWN AEROBIC ONLY Blood  Culture adequate volume Performed at [facility], [address], [place], [place] [address]    Culture   Final    NO GROWTH < 12 HOURS Performed at [facility], [address], [place], [place] [address]    Report Status PENDING  Incomplete  Surgical PCR screen     Status: None   Collection Time: 09/14/24  1:35 AM   Specimen: Nasal Mucosa; Nasal Swab  Result Value Ref Range Status   MRSA, PCR NEGATIVE NEGATIVE Final   Staphylococcus aureus  NEGATIVE NEGATIVE Final    Comment: (NOTE) The Xpert SA Assay (FDA approved for NASAL specimens in patients 92 years of age and older), is one component of a comprehensive surveillance program. It is not intended to diagnose infection nor to guide or monitor treatment. Performed at [facility], [address], [place], [place] [address]          Radiology Studies: ECHOCARDIOGRAM COMPLETE Result Date: 09/13/2024    ECHOCARDIOGRAM REPORT   Patient Name:   [facility] A Casalino Date of Exam: 09/13/2024 Medical Rec #:  [id number]       Height:       65.0 in Accession #:    [id number]      Weight:       218.0 lb Date of Birth:  [birth year]        BSA:          2.052 m Patient Age:    63 years        BP:           111/70 mmHg Patient Gender: F               HR:           83 bpm. Exam Location:  Inpatient Procedure: 2D Echo and Strain Analysis (Both Spectral and Color Flow Doppler            were utilized during procedure). Indications:    Bacteremia  History:        Patient has no prior history of Echocardiogram examinations.  Sonographer:    Charmaine Gaskins Referring Phys: [id number] [facility] IMPRESSIONS  1. Left ventricular ejection fraction, by estimation, is 60 to 65%. The left ventricle has normal function. The left ventricle has no regional wall motion abnormalities. There is mild left ventricular hypertrophy. Left ventricular diastolic parameters were normal. The average left ventricular global longitudinal  strain is -21.3 %. The global longitudinal strain is normal.  2. Right ventricular systolic function is normal. The right ventricular size is normal. There is normal pulmonary artery systolic pressure. The estimated right ventricular systolic pressure is 31.3 mmHg.  3. The mitral valve is normal in structure. Trivial mitral valve regurgitation. No evidence of mitral stenosis.  4. The aortic valve is tricuspid. Aortic valve regurgitation is not visualized. Aortic valve sclerosis is present, with no evidence of aortic valve stenosis.  5. The inferior vena cava is normal in size with greater than 50% respiratory variability, suggesting right atrial pressure of 3 mmHg. Conclusion(s)/Recommendation(s): No evidence of valvular vegetations on this transthoracic echocardiogram. Consider a transesophageal echocardiogram to exclude infective endocarditis if clinically indicated. FINDINGS  Left Ventricle: Left ventricular ejection fraction, by estimation, is 60 to 65%. The left ventricle has normal function. The left ventricle has no regional wall motion abnormalities. The average left ventricular global longitudinal strain is -21.3 %. Strain was performed and the global longitudinal strain is normal. The left ventricular internal cavity size was normal in size. There is mild left ventricular hypertrophy. Left ventricular diastolic parameters were normal. Right Ventricle: The right ventricular size is normal. No increase in right ventricular wall thickness. Right ventricular systolic function is normal. There is normal pulmonary artery systolic pressure. The tricuspid regurgitant velocity is 2.66 m/s, and  with an assumed right atrial pressure of 3 mmHg, the estimated right ventricular systolic pressure is 31.3 mmHg. Left Atrium: Left atrial size was normal in size. Right Atrium: Right atrial size was normal in size. Pericardium: Trivial pericardial  effusion is present. Mitral Valve: The mitral valve is normal in structure.  Trivial mitral valve regurgitation. No evidence of mitral valve stenosis. Tricuspid Valve: The tricuspid valve is normal in structure. Tricuspid valve regurgitation is trivial. Aortic Valve: The aortic valve is tricuspid. Aortic valve regurgitation is not visualized. Aortic valve sclerosis is present, with no evidence of aortic valve stenosis. Aortic valve mean gradient measures 8.0 mmHg. Aortic valve peak gradient measures 14.4 mmHg. Aortic valve area, by VTI measures 2.20 cm. Pulmonic Valve: The pulmonic valve was not well visualized. Pulmonic valve regurgitation is trivial. Aorta: The aortic root and ascending aorta are structurally normal, with no evidence of dilitation. Venous: The inferior vena cava is normal in size with greater than 50% respiratory variability, suggesting right atrial pressure of 3 mmHg. IAS/Shunts: The interatrial septum was not well visualized.  LEFT VENTRICLE PLAX 2D LVIDd:         5.30 cm   Diastology LVIDs:         3.60 cm   LV e' medial:    8.81 cm/s LV PW:         1.00 cm   LV E/e' medial:  11.5 LV IVS:        1.00 cm   LV e' lateral:   9.46 cm/s LVOT diam:     2.00 cm   LV E/e' lateral: 10.7 LV SV:         74 LV SV Index:   36        2D Longitudinal Strain LVOT Area:     3.14 cm  2D Strain GLS Avg:     -21.3 %  RIGHT VENTRICLE RV Basal diam:  3.10 cm RV Mid diam:    3.10 cm RV S prime:     16.60 cm/s LEFT ATRIUM             Index        RIGHT ATRIUM           Index LA diam:        4.00 cm 1.95 cm/m   RA Area:     17.50 cm LA Vol (A2C):   48.0 ml 23.39 ml/m  RA Volume:   45.40 ml  22.12 ml/m LA Vol (A4C):   51.1 ml 24.90 ml/m LA Biplane Vol: 53.6 ml 26.12 ml/m  AORTIC VALVE AV Area (Vmax):    2.33 cm AV Area (Vmean):   2.23 cm AV Area (VTI):     2.20 cm AV Vmax:           190.00 cm/s AV Vmean:          131.000 cm/s AV VTI:            0.335 m AV Peak Grad:      14.4 mmHg AV Mean Grad:      8.0 mmHg LVOT Vmax:         141.00 cm/s LVOT Vmean:        93.100 cm/s LVOT VTI:           0.235 m LVOT/AV VTI ratio: 0.70  AORTA Ao Root diam: 2.80 cm Ao Asc diam:  3.20 cm MITRAL VALVE                TRICUSPID VALVE MV Area (PHT): 3.89 cm     TR Peak grad:   28.3 mmHg MV Decel Time: 195 msec     TR Vmax:        266.00 cm/s MV  E velocity: 101.00 cm/s MV A velocity: 93.50 cm/s   SHUNTS MV E/A ratio:  1.08         Systemic VTI:  0.24 m                             Systemic Diam: 2.00 cm Lonni Nanas MD Electronically signed by Lonni Nanas MD Signature Date/Time: 09/13/2024/2:42:12 PM    Final          Scheduled Meds:  amLODipine  10 mg Oral Daily   enoxaparin (LOVENOX) injection  50 mg Subcutaneous Q24H   ketorolac   30 mg Intravenous Q6H   loratadine  10 mg Oral Daily   montelukast  10 mg Oral QHS   mupirocin ointment  1 Application Nasal BID   rosuvastatin  10 mg Oral Daily   scopolamine   1 patch Transdermal Q72H   Continuous Infusions:   ceFAZolin  (ANCEF ) IV Stopped (09/14/24 9362)          Sophie Mao, MD [facility] 09/14/2024, 8:03 AM

## 2024-09-15 ENCOUNTER — Encounter (HOSPITAL_COMMUNITY): Payer: Self-pay | Admitting: Surgery

## 2024-09-15 DIAGNOSIS — A419 Sepsis, unspecified organism: Secondary | ICD-10-CM | POA: Diagnosis not present

## 2024-09-15 MED ORDER — SODIUM CHLORIDE 0.9 % IV SOLN: INTRAVENOUS | Status: DC

## 2024-09-15 NOTE — Progress Notes (Signed)
   [facility] has been requested to perform a transesophageal echocardiogram on Anjelique A Vaux for MSSA Bacteremia.     The patient does NOT have any absolute or relative contraindications to a Transesophageal Echocardiogram (TEE).  The patient has: No other conditions that may impact this procedure.   After careful review of history and examination, the risks and benefits of transesophageal echocardiogram have been explained including risks of esophageal damage, perforation (1:10,000 risk), bleeding, pharyngeal hematoma as well as other potential complications associated with conscious sedation including aspiration, arrhythmia, respiratory failure and death. Alternatives to treatment were discussed, questions were answered. Patient is willing to proceed.   Bonney Morse Clause, PA-C  09/15/2024 12:23 PM

## 2024-09-15 NOTE — CV Procedure (Signed)
     PROCEDURE NOTE:  Procedure:  Transesophageal echocardiogram Operator:  Wilbert Bihari, MD Indications:  MSSA bacteremia Complications: None  During this procedure the patient is administered a total of Propofol  160 mg to achieve and maintain moderate conscious sedation.  The patient's heart rate, blood pressure, and oxygen saturation are monitored continuously during the procedure. The period of conscious sedation is 6 minutes, of which I was present face-to-face 100% of this time. Catherene Schmitz, CRNA is an independent, trained observer who assisted in the monitoring of the patient's level of consciousness.   Results: Normal LV size and function Normal RV size and function Normal RA Normal LA and normal LA appendage Normal TV with mild TR Normal PV with trivial PR Normal MV with mild to moderate MR Normal trileaflet AV with trivial AR Lipomatous interatrial septum with no evidence of shunt by colorflow dopper  Normal thoracic and ascending aorta. No evidence of vegetation.  The patient tolerated the procedure well and was transferred back to their room in stable condition.  Signed: Wilbert Bihari, MD [facility]

## 2024-09-15 NOTE — Progress Notes (Signed)
 [facility]  Date of Admission:  09/10/2024     Principal Problem:   Sepsis [facility]) Active Problems:   Malignant neoplasm of upper-inner quadrant of left breast in female, estrogen receptor positive (HCC)   Class 2 obesity   Low back pain   Transaminitis   Essential hypertension   Hyperkalemia   MSSA bacteremia   Mild intermittent asthma          Assessment: 63 year old female with history of left breast cancer status post surgery on chemotherapy has a port, hypertension hyperlipidemia, asthma presented with fever and low back pain, noticed swelling at port site. Found to have: #MSSA bacteremia secondary to Port-A-Cath infection with associated cellulitis #Low back pain #Elevated LFT improving #Thrombocytopenia improving #New 8 mm solid right lower lobe pulmonary nodule concerning in the setting of breast cancer - Port removed on 11/17 with Dr. Belinda.  Or notable for 20 cc of purulent fluid that was evacuated cultures growing Staph aureus suspect MSSA - TTE without vegetation - MRI L-spine without evidence of discitis or osteomyelitis - Will need TEE  plan: - TEE, communicated with primary - Continue cefazolin  - Blood cultures today - Final antibiotic course pending TEE and repeat blood cultures   Microbiology:   Antibiotics: Cefazolin  Cultures: Blood 11/12 1 out of 1 MSSA 11/15 no growth 11/17  Urine  Other 11/16 Staph aureus  SUBJECTIVE: Sitting at the edge of the bed.  No new complaints. Interval: Afebrile overnight.    Review of Systems: Review of Systems  All other systems reviewed and are negative.    Scheduled Meds:  amLODipine  10 mg Oral Daily   enoxaparin (LOVENOX) injection  50 mg Subcutaneous Q24H   ketorolac   30 mg Intravenous Q6H   loratadine  10 mg Oral Daily   montelukast  10 mg Oral QHS   mupirocin ointment  1 Application Nasal BID   rosuvastatin  10 mg Oral Daily   Continuous Infusions:   sodium chloride  20 mL/hr at 09/15/24 1359    ceFAZolin  (ANCEF ) IV Stopped (09/15/24 1344)   PRN Meds:.acetaminophen  **OR** acetaminophen , albuterol , ondansetron  **OR** ondansetron  (ZOFRAN ) IV, mouth rinse, oxyCODONE , prochlorperazine , zolpidem No Known Allergies  OBJECTIVE: Vitals:   09/15/24 0620 09/15/24 0840 09/15/24 0945 09/15/24 1337  BP: 129/76  (!) 104/59 113/75  Pulse: 79  84 78  Resp: 18   18  Temp: (!) 97.4 F (36.3 C)   98.1 F (36.7 C)  TempSrc: Oral   Oral  SpO2: 99% 98%  97%  Weight:      Height:       Body mass index is 37.08 kg/m.  Physical Exam Constitutional:      Appearance: Normal appearance.  HENT:     Head: Normocephalic and atraumatic.     Right Ear: Tympanic membrane normal.     Left Ear: Tympanic membrane normal.     Nose: Nose normal.     Mouth/Throat:     Mouth: Mucous membranes are moist.  Eyes:     Extraocular Movements: Extraocular movements intact.     Conjunctiva/sclera: Conjunctivae normal.     Pupils: Pupils are equal, round, and reactive to light.  Cardiovascular:     Rate and Rhythm: Normal rate and regular rhythm.     Heart sounds: No murmur heard.    No friction rub. No gallop.  Pulmonary:     Effort: Pulmonary effort is normal.     Breath sounds:  Normal breath sounds.  Abdominal:     General: Abdomen is flat.     Palpations: Abdomen is soft.  Musculoskeletal:        General: Normal range of motion.  Skin:    General: Skin is warm and dry.  Neurological:     General: No focal deficit present.     Mental Status: She is alert and oriented to person, place, and time.  Psychiatric:        Mood and Affect: Mood normal.       Lab Results Lab Results  Component Value Date   WBC 9.3 09/13/2024   HGB 11.0 (L) 09/13/2024   HCT 33.3 (L) 09/13/2024   MCV 85.4 09/13/2024   PLT 149 (L) 09/13/2024    Lab Results  Component Value Date   CREATININE 0.54 09/14/2024   BUN 8 09/14/2024   NA 138 09/14/2024   K 3.4 (L)  09/14/2024   CL 102 09/14/2024   CO2 26 09/14/2024    Lab Results  Component Value Date   ALT 35 09/13/2024   AST 39 09/13/2024   ALKPHOS 103 09/13/2024   BILITOT 0.4 09/13/2024        Loney Stank, MD [facility] 09/15/2024, 2:46 PM Evaluation of this patient requires complex antimicrobial therapy evaluation and counseling + isolation needs for disease transmission risk assessment and mitigation

## 2024-09-15 NOTE — Progress Notes (Addendum)
 1 Day Post-Op  Subjective: No complaints today.  Feeling better.    ROS: See above, otherwise other systems negative  Objective: Vital signs in last 24 hours: Temp:  [97.4 F (36.3 C)-100.5 F (38.1 C)] 97.4 F (36.3 C) (11/17 0620) Pulse Rate:  [77-90] 79 (11/17 0620) Resp:  [17-27] 18 (11/17 0620) BP: (100-129)/(62-76) 129/76 (11/17 0620) SpO2:  [92 %-100 %] 98 % (11/17 0840) Weight:  [101.1 kg] 101.1 kg (11/17 0500) Last BM Date : 09/13/24  Intake/Output from previous day: 11/16 0701 - 11/17 0700 In: 1108.5 [P.O.:180; I.V.:628.5; IV Piggyback:300] Out: -  Intake/Output this shift: Total I/O In: 440 [P.O.:240; IV Piggyback:200] Out: -   PE: Chest: right upper chest site is c/d/I with sutures and a penrose in place.  No active drainage at this time.  No erythema or cellulitis  Lab Results:  Recent Labs    09/13/24 0052  WBC 9.3  HGB 11.0*  HCT 33.3*  PLT 149*   BMET Recent Labs    09/13/24 0052 09/14/24 0535  NA 137 138  K 3.0* 3.4*  CL 103 102  CO2 23 26  GLUCOSE 123* 120*  BUN 7* 8  CREATININE 0.56 0.54  CALCIUM 8.2* 8.1*   PT/INR No results for input(s): LABPROT, INR in the last 72 hours. CMP     Component Value Date/Time   NA 138 09/14/2024 0535   K 3.4 (L) 09/14/2024 0535   CL 102 09/14/2024 0535   CO2 26 09/14/2024 0535   GLUCOSE 120 (H) 09/14/2024 0535   BUN 8 09/14/2024 0535   CREATININE 0.54 09/14/2024 0535   CREATININE 0.82 09/03/2024 1525   CALCIUM 8.1 (L) 09/14/2024 0535   PROT 5.5 (L) 09/13/2024 0052   ALBUMIN 2.9 (L) 09/13/2024 0052   AST 39 09/13/2024 0052   AST 14 (L) 09/03/2024 1525   ALT 35 09/13/2024 0052   ALT 12 09/03/2024 1525   ALKPHOS 103 09/13/2024 0052   BILITOT 0.4 09/13/2024 0052   BILITOT 0.4 09/03/2024 1525   GFRNONAA >60 09/14/2024 0535   GFRNONAA >60 09/03/2024 1525   GFRAA >60 12/22/2019 1217   Lipase     Component Value Date/Time   LIPASE 39 02/17/2022 2333       Studies/Results: MR  Lumbar Spine W Wo Contrast Result Date: 09/14/2024 EXAM: MRI LUMBAR SPINE 09/14/2024 01:10:41 PM TECHNIQUE: Multiplanar multisequence MRI of the lumbar spine was performed without and with the administration of intravenous contrast. COMPARISON: None available. CLINICAL HISTORY: bacteremia, r/o discitis and osteomyelitis FINDINGS: BONES AND ALIGNMENT: Normal alignment. Normal vertebral body heights. Bone marrow signal is unremarkable. SPINAL CORD: The conus terminates normally. SOFT TISSUES: No paraspinal mass. L1-L2: No significant disc herniation. No spinal canal stenosis or neural foraminal narrowing. L2-L3: No significant disc herniation. No spinal canal stenosis or neural foraminal narrowing. L3-L4: No significant disc herniation. No spinal canal stenosis or neural foraminal narrowing. L4-L5: Facet degenerative changes are present. No focal disc protrusion or stenosis. L5-S1: Facet degenerative changes are present. No focal disc protrusion or stenosis. IMPRESSION: 1. No evidence of discitis or osteomyelitis. Electronically signed by: Lonni Necessary MD 09/14/2024 01:17 PM EST RP Workstation: [id number]   ECHOCARDIOGRAM COMPLETE Result Date: 09/13/2024    ECHOCARDIOGRAM REPORT   Patient Name:   Mary Mendez Date of Exam: 09/13/2024 Medical Rec #:  [id number]       Height:       65.0 in Accession #:    [id number]  Weight:       218.0 lb Date of Birth:  [birth year]        BSA:          2.052 m Patient Age:    63 years        BP:           111/70 mmHg Patient Gender: F               HR:           83 bpm. Exam Location:  Inpatient Procedure: 2D Echo and Strain Analysis (Both Spectral and Color Flow Doppler            were utilized during procedure). Indications:    Bacteremia  History:        Patient has no prior history of Echocardiogram examinations.  Sonographer:    Charmaine Gaskins Referring Phys: [id number] [facility] IMPRESSIONS  1. Left ventricular ejection fraction, by estimation, is 60 to 65%.  The left ventricle has normal function. The left ventricle has no regional wall motion abnormalities. There is mild left ventricular hypertrophy. Left ventricular diastolic parameters were normal. The average left ventricular global longitudinal strain is -21.3 %. The global longitudinal strain is normal.  2. Right ventricular systolic function is normal. The right ventricular size is normal. There is normal pulmonary artery systolic pressure. The estimated right ventricular systolic pressure is 31.3 mmHg.  3. The mitral valve is normal in structure. Trivial mitral valve regurgitation. No evidence of mitral stenosis.  4. The aortic valve is tricuspid. Aortic valve regurgitation is not visualized. Aortic valve sclerosis is present, with no evidence of aortic valve stenosis.  5. The inferior vena cava is normal in size with greater than 50% respiratory variability, suggesting right atrial pressure of 3 mmHg. Conclusion(s)/Recommendation(s): No evidence of valvular vegetations on this transthoracic echocardiogram. Consider a transesophageal echocardiogram to exclude infective endocarditis if clinically indicated. FINDINGS  Left Ventricle: Left ventricular ejection fraction, by estimation, is 60 to 65%. The left ventricle has normal function. The left ventricle has no regional wall motion abnormalities. The average left ventricular global longitudinal strain is -21.3 %. Strain was performed and the global longitudinal strain is normal. The left ventricular internal cavity size was normal in size. There is mild left ventricular hypertrophy. Left ventricular diastolic parameters were normal. Right Ventricle: The right ventricular size is normal. No increase in right ventricular wall thickness. Right ventricular systolic function is normal. There is normal pulmonary artery systolic pressure. The tricuspid regurgitant velocity is 2.66 m/s, and  with an assumed right atrial pressure of 3 mmHg, the estimated right ventricular  systolic pressure is 31.3 mmHg. Left Atrium: Left atrial size was normal in size. Right Atrium: Right atrial size was normal in size. Pericardium: Trivial pericardial effusion is present. Mitral Valve: The mitral valve is normal in structure. Trivial mitral valve regurgitation. No evidence of mitral valve stenosis. Tricuspid Valve: The tricuspid valve is normal in structure. Tricuspid valve regurgitation is trivial. Aortic Valve: The aortic valve is tricuspid. Aortic valve regurgitation is not visualized. Aortic valve sclerosis is present, with no evidence of aortic valve stenosis. Aortic valve mean gradient measures 8.0 mmHg. Aortic valve peak gradient measures 14.4 mmHg. Aortic valve area, by VTI measures 2.20 cm. Pulmonic Valve: The pulmonic valve was not well visualized. Pulmonic valve regurgitation is trivial. Aorta: The aortic root and ascending aorta are structurally normal, with no evidence of dilitation. Venous: The inferior vena cava is normal in size with greater  than 50% respiratory variability, suggesting right atrial pressure of 3 mmHg. IAS/Shunts: The interatrial septum was not well visualized.  LEFT VENTRICLE PLAX 2D LVIDd:         5.30 cm   Diastology LVIDs:         3.60 cm   LV e' medial:    8.81 cm/s LV PW:         1.00 cm   LV E/e' medial:  11.5 LV IVS:        1.00 cm   LV e' lateral:   9.46 cm/s LVOT diam:     2.00 cm   LV E/e' lateral: 10.7 LV SV:         74 LV SV Index:   36        2D Longitudinal Strain LVOT Area:     3.14 cm  2D Strain GLS Avg:     -21.3 %  RIGHT VENTRICLE RV Basal diam:  3.10 cm RV Mid diam:    3.10 cm RV S prime:     16.60 cm/s LEFT ATRIUM             Index        RIGHT ATRIUM           Index LA diam:        4.00 cm 1.95 cm/m   RA Area:     17.50 cm LA Vol (A2C):   48.0 ml 23.39 ml/m  RA Volume:   45.40 ml  22.12 ml/m LA Vol (A4C):   51.1 ml 24.90 ml/m LA Biplane Vol: 53.6 ml 26.12 ml/m  AORTIC VALVE AV Area (Vmax):    2.33 cm AV Area (Vmean):   2.23 cm AV Area  (VTI):     2.20 cm AV Vmax:           190.00 cm/s AV Vmean:          131.000 cm/s AV VTI:            0.335 m AV Peak Grad:      14.4 mmHg AV Mean Grad:      8.0 mmHg LVOT Vmax:         141.00 cm/s LVOT Vmean:        93.100 cm/s LVOT VTI:          0.235 m LVOT/AV VTI ratio: 0.70  AORTA Ao Root diam: 2.80 cm Ao Asc diam:  3.20 cm MITRAL VALVE                TRICUSPID VALVE MV Area (PHT): 3.89 cm     TR Peak grad:   28.3 mmHg MV Decel Time: 195 msec     TR Vmax:        266.00 cm/s MV E velocity: 101.00 cm/s MV A velocity: 93.50 cm/s   SHUNTS MV E/A ratio:  1.08         Systemic VTI:  0.24 m                             Systemic Diam: 2.00 cm Lonni Nanas MD Electronically signed by Lonni Nanas MD Signature Date/Time: 09/13/2024/2:42:12 PM    Final     Anti-infectives: Anti-infectives (From admission, onward)    Start     Dose/Rate Route Frequency Ordered Stop   09/11/24 2100  vancomycin (VANCOREADY) IVPB 1500 mg/300 mL  Status:  Discontinued        1,500  mg 150 mL/hr over 120 Minutes Intravenous Every 24 hours 09/11/24 0807 09/11/24 1352   09/11/24 1445  ceFAZolin  (ANCEF ) IVPB 2g/100 mL premix        2 g 200 mL/hr over 30 Minutes Intravenous Every 8 hours 09/11/24 1352     09/11/24 0800  ceFEPIme (MAXIPIME) 2 g in sodium chloride  0.9 % 100 mL IVPB  Status:  Discontinued        2 g 200 mL/hr over 30 Minutes Intravenous Every 8 hours 09/11/24 0754 09/11/24 1352   09/11/24 0800  metroNIDAZOLE (FLAGYL) IVPB 500 mg  Status:  Discontinued        500 mg 100 mL/hr over 60 Minutes Intravenous Every 12 hours 09/11/24 0754 09/11/24 1352   09/10/24 2015  vancomycin (VANCOCIN) IVPB 1000 mg/200 mL premix       Placed in Followed by Linked Group   1,000 mg 200 mL/hr over 60 Minutes Intravenous  Once 09/10/24 1905 09/10/24 2241   09/10/24 1915  vancomycin (VANCOCIN) IVPB 1000 mg/200 mL premix       Placed in Followed by Linked Group   1,000 mg 200 mL/hr over 60 Minutes Intravenous  Once  09/10/24 1905 09/10/24 2241   09/10/24 1900  ceFEPIme (MAXIPIME) 2 g in sodium chloride  0.9 % 100 mL IVPB        2 g 200 mL/hr over 30 Minutes Intravenous  Once 09/10/24 1857 09/10/24 2017   09/10/24 1900  metroNIDAZOLE (FLAGYL) IVPB 500 mg        500 mg 100 mL/hr over 60 Minutes Intravenous  Once 09/10/24 1857 09/10/24 2039   09/10/24 1900  vancomycin (VANCOCIN) IVPB 1000 mg/200 mL premix  Status:  Discontinued        1,000 mg 200 mL/hr over 60 Minutes Intravenous  Once 09/10/24 1857 09/10/24 1905        Assessment/Plan POD 1, s/p PAC removal for infection and bacteremia -cont sutures and penrose.  Will arrange outpatient follow up for removal in 10-14 days -patient may shower -cx with gram + cocci -abx per primary/ID -no further surgical needs.  We will sign off at this time   FEN - regular VTE - lovenox ID - ancef   Breast cancer   LOS: 4 days    Burnard FORBES Banter , [facility] 09/15/2024, 9:30 AM Please see Amion for pager number during day hours 7:00am-4:30pm or 7:00am -11:30am on weekends

## 2024-09-15 NOTE — Progress Notes (Signed)
 PROGRESS NOTE    Mary Mendez  FMW:[id number] DOB: December 23, 1960 DOA: 09/10/2024 PCP: [name], MD   Brief Narrative:  63 year old female with history of left breast cancer, asthma, hyperlipidemia, hypertension, anxiety presented with fever and blood culture growing MSSA.  On presentation, WBC was 4.3, COVID/influenza/RSV PCR negative.  AST of 53 and ALT of 54.  Chest x-ray showed mild central vascular congestion with interstitial edema.  CT L-spine showed mild degenerative changes of the visualized thoracolumbar spine.  CT chest/abdomen/pelvis with contrast showed mild subcutaneous stranding along the right chest port, correlate for cellulitis; new 8 mm right lower lobe pulmonary nodule.  She was started on IV antibiotics.  ID was consulted.  Port-A-Cath was removed by general surgery on 09/14/2024.  Assessment & Plan:   MSSA bacteremia Sepsis: Present on admission -Presented with fever, tachypnea with MSSA bacteremia and Port-A-Cath site infection. - Possibly related to Port-A-Cath infection.  Continue IV cefazolin .  2D echo showed EF of 60 to 65% with no vegetations.  MRI lumbar spine did not show osteomyelitis or discitis.  Port-A-Cath was removed by general surgery on 09/14/2024. Follow repeat blood cultures from 09/13/2024: Negative so far.  Left breast cancer -Outpatient follow-up with oncology  Essential hypertension Hyperlipidemia - Continue amlodipine and statin  Hyponatremia - Resolved  Hyperkalemia - Resolved  Hypokalemia - No labs today.  Thrombocytopenia - Mild.  No signs of bleeding.  Monitor intermittently  Mildly elevated LFTs - Resolved  Obesity class II - Outpatient follow-up  Mild intermittent asthma - Continue loratadine and montelukast.  Continue as needed albuterol  nebulizer.  No longer using Advair   DVT prophylaxis: Lovenox Code Status: Full Family Communication: Daughter at bedside on 09/14/2024 Disposition Plan: Status is:  Inpatient Remains inpatient appropriate because: Of severity of illness    Consultants: ID/general surgery  Procedures: 2D echo Antimicrobials: Cefazolin  from 09/11/2024 onwards   Subjective: Patient seen and examined at bedside.  No chest pain, shortness of breath, fever or vomiting reported.   Objective: Vitals:   09/14/24 1959 09/15/24 0500 09/15/24 0612 09/15/24 0620  BP: 113/69  129/76 129/76  Pulse: 78  77 79  Resp: 20  18 18   Temp: 97.8 F (36.6 C)  (!) 97.4 F (36.3 C) (!) 97.4 F (36.3 C)  TempSrc: Oral  Oral Oral  SpO2: 95%  97% 99%  Weight:  101.1 kg    Height:        Intake/Output Summary (Last 24 hours) at 09/15/2024 0744 Last data filed at 09/15/2024 0723 Gross per 24 hour  Intake 1308.54 ml  Output --  Net 1308.54 ml   Filed Weights   09/11/24 1254 09/13/24 0606 09/15/24 0500  Weight: 99.8 kg 98.9 kg 101.1 kg    Examination:  General: On room air.  No acute distress.  respiratory: Decreased breath sounds at bases bilaterally with some crackles CVS: Currently rate controlled; S1-S2 heard  abdominal: Soft, obese, nontender, distended slightly, no organomegaly; normal bowel sounds are heard  extremities: Trace lower extremity edema; no cyanosis     Data Reviewed: I have personally reviewed following labs and imaging studies  CBC: Recent Labs  Lab 09/10/24 1855 09/12/24 0604 09/13/24 0052  WBC 4.3 8.2 9.3  NEUTROABS 2.4 4.8 6.3  HGB 13.2 12.7 11.0*  HCT 39.2 40.0 33.3*  MCV 84.1 88.7 85.4  PLT 196 144* 149*   Basic Metabolic Panel: Recent Labs  Lab 09/10/24 1855 09/12/24 0604 09/13/24 0052 09/14/24 0535  NA 132* 134* 137 138  K 5.5* 3.4* 3.0* 3.4*  CL 97* 101 103 102  CO2 23 21* 23 26  GLUCOSE 138* 99 123* 120*  BUN 12 8 7* 8  CREATININE 0.67 0.62 0.56 0.54  CALCIUM 8.8* 8.2* 8.2* 8.1*  MG  --   --  2.3 2.2   GFR: Estimated Creatinine Clearance: 87 mL/min (by C-G formula based on SCr of 0.54 mg/dL). Liver Function  Tests: Recent Labs  Lab 09/10/24 1855 09/12/24 0604 09/13/24 0052  AST 53* 50* 39  ALT 54* 50* 35  ALKPHOS 109 105 103  BILITOT 0.7 0.5 0.4  PROT 6.5 6.0* 5.5*  ALBUMIN 3.8 3.1* 2.9*   No results for input(s): LIPASE, AMYLASE in the last 168 hours. No results for input(s): AMMONIA in the last 168 hours. Coagulation Profile: Recent Labs  Lab 09/10/24 1945  INR 0.9   Cardiac Enzymes: No results for input(s): CKTOTAL, CKMB, CKMBINDEX, TROPONINI in the last 168 hours. BNP (last 3 results) No results for input(s): PROBNP in the last 8760 hours. HbA1C: No results for input(s): HGBA1C in the last 72 hours. CBG: No results for input(s): GLUCAP in the last 168 hours. Lipid Profile: No results for input(s): CHOL, HDL, LDLCALC, TRIG, CHOLHDL, LDLDIRECT in the last 72 hours. Thyroid Function Tests: No results for input(s): TSH, T4TOTAL, FREET4, T3FREE, THYROIDAB in the last 72 hours. Anemia Panel: No results for input(s): VITAMINB12, FOLATE, FERRITIN, TIBC, IRON, RETICCTPCT in the last 72 hours. Sepsis Labs: Recent Labs  Lab 09/10/24 1855  LATICACIDVEN 1.5    Recent Results (from the past 240 hours)  Culture, blood (Routine x 2)     Status: Abnormal   Collection Time: 09/10/24  6:49 PM   Specimen: BLOOD  Result Value Ref Range Status   Specimen Description   Final    BLOOD RIGHT ANTECUBITAL Performed at [facility], [address] [place], [place] [address]    Special Requests   Final    Blood Culture adequate volume BOTTLES DRAWN AEROBIC AND ANAEROBIC Performed at [facility], [address], [place], [place] [address]    Culture  Setup Time   Final    GRAM POSITIVE COCCI IN CLUSTERS IN BOTH AEROBIC AND ANAEROBIC BOTTLES CRITICAL RESULT CALLED TO, READ BACK BY AND VERIFIED WITH: PHARMD CRYSTAL ROBERTSON ON 09/11/24 @ 1320 BY DRT Performed at [facility], [address], [place], [place] [address]    Culture STAPHYLOCOCCUS AUREUS (A)  Final   Report Status 09/13/2024 FINAL  Final   Organism ID, Bacteria STAPHYLOCOCCUS AUREUS  Final      Susceptibility   Staphylococcus aureus - MIC*    CIPROFLOXACIN <=0.5 SENSITIVE Sensitive     ERYTHROMYCIN <=0.25 SENSITIVE Sensitive     GENTAMICIN <=0.5 SENSITIVE Sensitive     OXACILLIN <=0.25 SENSITIVE Sensitive     TETRACYCLINE <=1 SENSITIVE Sensitive     VANCOMYCIN <=0.5 SENSITIVE Sensitive     TRIMETH/SULFA <=10 SENSITIVE Sensitive     CLINDAMYCIN <=0.25 SENSITIVE Sensitive     RIFAMPIN <=0.5 SENSITIVE Sensitive     Inducible Clindamycin NEGATIVE Sensitive     LINEZOLID 1 SENSITIVE Sensitive     * STAPHYLOCOCCUS AUREUS  Blood Culture ID Panel (Reflexed)     Status: Abnormal   Collection Time: 09/10/24  6:49 PM  Result Value Ref Range Status   Enterococcus faecalis NOT DETECTED NOT DETECTED Final   Enterococcus Faecium NOT DETECTED NOT DETECTED Final   Listeria monocytogenes NOT DETECTED NOT DETECTED Final  Staphylococcus species DETECTED (A) NOT DETECTED Final    Comment: CRITICAL RESULT CALLED TO, READ BACK BY AND VERIFIED WITH: PHARMD CRYSTAL ROBERTSON ON 09/11/24 @ 1320 BY DRT    Staphylococcus aureus (BCID) DETECTED (A) NOT DETECTED Final    Comment: CRITICAL RESULT CALLED TO, READ BACK BY AND VERIFIED WITH: PHARMD CRYSTAL ROBERTSON ON 09/11/24 @ 1320 BY DRT    Staphylococcus epidermidis NOT DETECTED NOT DETECTED Final   Staphylococcus lugdunensis NOT DETECTED NOT DETECTED Final   Streptococcus species NOT DETECTED NOT DETECTED Final   Streptococcus agalactiae NOT DETECTED NOT DETECTED Final   Streptococcus pneumoniae NOT DETECTED NOT DETECTED Final   Streptococcus pyogenes NOT DETECTED NOT DETECTED Final   A.calcoaceticus-baumannii NOT DETECTED NOT DETECTED Final   Bacteroides fragilis NOT DETECTED NOT DETECTED Final   Enterobacterales NOT DETECTED NOT DETECTED Final   Enterobacter cloacae  complex NOT DETECTED NOT DETECTED Final   Escherichia coli NOT DETECTED NOT DETECTED Final   Klebsiella aerogenes NOT DETECTED NOT DETECTED Final   Klebsiella oxytoca NOT DETECTED NOT DETECTED Final   Klebsiella pneumoniae NOT DETECTED NOT DETECTED Final   Proteus species NOT DETECTED NOT DETECTED Final   Salmonella species NOT DETECTED NOT DETECTED Final   Serratia marcescens NOT DETECTED NOT DETECTED Final   Haemophilus influenzae NOT DETECTED NOT DETECTED Final   Neisseria meningitidis NOT DETECTED NOT DETECTED Final   Pseudomonas aeruginosa NOT DETECTED NOT DETECTED Final   Stenotrophomonas maltophilia NOT DETECTED NOT DETECTED Final   Candida albicans NOT DETECTED NOT DETECTED Final   Candida auris NOT DETECTED NOT DETECTED Final   Candida glabrata NOT DETECTED NOT DETECTED Final   Candida krusei NOT DETECTED NOT DETECTED Final   Candida parapsilosis NOT DETECTED NOT DETECTED Final   Candida tropicalis NOT DETECTED NOT DETECTED Final   Cryptococcus neoformans/gattii NOT DETECTED NOT DETECTED Final   Meth resistant mecA/C and MREJ NOT DETECTED NOT DETECTED Final    Comment: Performed at [facility], [address], [place], [place] [address]  Resp panel by RT-PCR (RSV, Flu A&B, Covid) Anterior Nasal Swab     Status: None   Collection Time: 09/10/24  8:36 PM   Specimen: Anterior Nasal Swab  Result Value Ref Range Status   SARS Coronavirus 2 by RT PCR NEGATIVE NEGATIVE Final    Comment: (NOTE) SARS-CoV-2 target nucleic acids are NOT DETECTED.  The SARS-CoV-2 RNA is generally detectable in upper respiratory specimens during the acute phase of infection. The lowest concentration of SARS-CoV-2 viral copies this assay can detect is 138 copies/mL. A negative result does not preclude SARS-Cov-2 infection and should not be used as the sole basis for treatment or other patient management decisions. A negative result may occur with  improper specimen collection/handling, submission  of specimen other than nasopharyngeal swab, presence of viral mutation(s) within the areas targeted by this assay, and inadequate number of viral copies(<138 copies/mL). A negative result must be combined with clinical observations, patient history, and epidemiological information. The expected result is Negative.  Fact Sheet for Patients:  bloggercourse.com  Fact Sheet for Healthcare Providers:  seriousbroker.it  This test is no t yet approved or cleared by the [place]  FDA and  has been authorized for detection and/or diagnosis of SARS-CoV-2 by FDA under an Emergency Use Authorization (EUA). This EUA will remain  in effect (meaning this test can be used) for the duration of the COVID-19 declaration under Section 564(b)(1) of the Act, 21 U.S.C.section 360bbb-3(b)(1), unless the authorization is terminated  or revoked sooner.       Influenza A by PCR NEGATIVE NEGATIVE Final   Influenza B by PCR NEGATIVE NEGATIVE Final    Comment: (NOTE) The Xpert Xpress SARS-CoV-2/FLU/RSV plus assay is intended as an aid in the diagnosis of influenza from Nasopharyngeal swab specimens and should not be used as a sole basis for treatment. Nasal washings and aspirates are unacceptable for Xpert Xpress SARS-CoV-2/FLU/RSV testing.  Fact Sheet for Patients: bloggercourse.com  Fact Sheet for Healthcare Providers: seriousbroker.it  This test is not yet approved or cleared by the [place]  FDA and has been authorized for detection and/or diagnosis of SARS-CoV-2 by FDA under an Emergency Use Authorization (EUA). This EUA will remain in effect (meaning this test can be used) for the duration of the COVID-19 declaration under Section 564(b)(1) of the Act, 21 U.S.C. section 360bbb-3(b)(1), unless the authorization is terminated or revoked.     Resp Syncytial Virus by PCR NEGATIVE NEGATIVE  Final    Comment: (NOTE) Fact Sheet for Patients: bloggercourse.com  Fact Sheet for Healthcare Providers: seriousbroker.it  This test is not yet approved or cleared by the [place]  FDA and has been authorized for detection and/or diagnosis of SARS-CoV-2 by FDA under an Emergency Use Authorization (EUA). This EUA will remain in effect (meaning this test can be used) for the duration of the COVID-19 declaration under Section 564(b)(1) of the Act, 21 U.S.C. section 360bbb-3(b)(1), unless the authorization is terminated or revoked.  Performed at [facility], [address] [place], [place] [address]   Culture, blood (Routine X 2) w Reflex to ID Panel     Status: None (Preliminary result)   Collection Time: 09/13/24 12:52 AM   Specimen: BLOOD RIGHT ARM  Result Value Ref Range Status   Specimen Description   Final    BLOOD RIGHT ARM Performed at [facility], [address], [place], [place] [address]    Special Requests   Final    BOTTLES DRAWN AEROBIC ONLY Blood Culture adequate volume Performed at [facility], [address] [place], [place] [address]    Culture   Final    NO GROWTH 1 DAY Performed at [facility], [address], [place], [place] [address]    Report Status PENDING  Incomplete  Culture, blood (Routine X 2) w Reflex to ID Panel     Status: None (Preliminary result)   Collection Time: 09/13/24 12:52 AM   Specimen: BLOOD RIGHT ARM  Result Value Ref Range Status   Specimen Description   Final    BLOOD RIGHT ARM Performed at [facility], [address] [place], [place] [address]    Special Requests   Final    BOTTLES DRAWN AEROBIC ONLY Blood Culture adequate volume Performed at [facility], [address], [place], [place] [address]    Culture   Final    NO GROWTH 1 DAY Performed at [facility], [address],  [place], [place] [address]    Report Status PENDING  Incomplete  Surgical PCR screen     Status: None   Collection Time: 09/14/24  1:35 AM   Specimen: Nasal Mucosa; Nasal Swab  Result Value Ref Range Status   MRSA, PCR NEGATIVE NEGATIVE Final   Staphylococcus aureus NEGATIVE NEGATIVE Final    Comment: (NOTE) The Xpert SA Assay (FDA approved for NASAL specimens in patients 66 years of age and older), is one component of a  comprehensive surveillance program. It is not intended to diagnose infection nor to guide or monitor treatment. Performed at [facility], [address] [place], [place] [address]   Aerobic/Anaerobic Culture w Gram Stain (surgical/deep wound)     Status: None (Preliminary result)   Collection Time: 09/14/24  9:20 AM   Specimen: Path fluid; Body Fluid  Result Value Ref Range Status   Specimen Description   Final    FLUID Performed at [facility], [address], [place], [place] [address]    Special Requests   Final    NONE Performed at [facility], [address] [place], [place] [address]    Gram Stain NO WBC SEEN RARE GRAM POSITIVE COCCI IN PAIRS   Final   Culture   Final    CULTURE REINCUBATED FOR BETTER GROWTH Performed at [facility], [address] [place], [place] [address]    Report Status PENDING  Incomplete         Radiology Studies: MR Lumbar Spine W Wo Contrast Result Date: 09/14/2024 EXAM: MRI LUMBAR SPINE 09/14/2024 01:10:41 PM TECHNIQUE: Multiplanar multisequence MRI of the lumbar spine was performed without and with the administration of intravenous contrast. COMPARISON: None available. CLINICAL HISTORY: bacteremia, r/o discitis and osteomyelitis FINDINGS: BONES AND ALIGNMENT: Normal alignment. Normal vertebral body heights. Bone marrow signal is unremarkable. SPINAL CORD: The conus terminates normally. SOFT TISSUES: No paraspinal mass. L1-L2: No significant disc herniation.  No spinal canal stenosis or neural foraminal narrowing. L2-L3: No significant disc herniation. No spinal canal stenosis or neural foraminal narrowing. L3-L4: No significant disc herniation. No spinal canal stenosis or neural foraminal narrowing. L4-L5: Facet degenerative changes are present. No focal disc protrusion or stenosis. L5-S1: Facet degenerative changes are present. No focal disc protrusion or stenosis. IMPRESSION: 1. No evidence of discitis or osteomyelitis. Electronically signed by: Lonni Necessary MD 09/14/2024 01:17 PM EST RP Workstation: [id number]   ECHOCARDIOGRAM COMPLETE Result Date: 09/13/2024    ECHOCARDIOGRAM REPORT   Patient Name:   [facility] A Kuntzman Date of Exam: 09/13/2024 Medical Rec #:  [id number]       Height:       65.0 in Accession #:    [id number]      Weight:       218.0 lb Date of Birth:  21-Dec-1960        BSA:          2.052 m Patient Age:    63 years        BP:           111/70 mmHg Patient Gender: F               HR:           83 bpm. Exam Location:  Inpatient Procedure: 2D Echo and Strain Analysis (Both Spectral and Color Flow Doppler            were utilized during procedure). Indications:    Bacteremia  History:        Patient has no prior history of Echocardiogram examinations.  Sonographer:    Charmaine Gaskins Referring Phys: [id number] [facility] IMPRESSIONS  1. Left ventricular ejection fraction, by estimation, is 60 to 65%. The left ventricle has normal function. The left ventricle has no regional wall motion abnormalities. There is mild left ventricular hypertrophy. Left ventricular diastolic parameters were normal. The average left ventricular global longitudinal strain is -21.3 %. The global longitudinal strain is normal.  2. Right ventricular systolic function is normal. The right ventricular size is normal. There is normal pulmonary artery systolic pressure. The estimated right ventricular systolic pressure is 31.3 mmHg.  3. The mitral valve is normal in structure.  Trivial mitral valve regurgitation. No evidence of mitral stenosis.  4. The aortic valve is tricuspid. Aortic valve regurgitation is not visualized. Aortic valve sclerosis is present, with no evidence of aortic valve stenosis.  5. The inferior vena cava is normal in size with greater than 50% respiratory variability, suggesting right atrial pressure of 3 mmHg. Conclusion(s)/Recommendation(s): No evidence of valvular vegetations on this transthoracic echocardiogram. Consider a transesophageal echocardiogram to exclude infective endocarditis if clinically indicated. FINDINGS  Left Ventricle: Left ventricular ejection fraction, by estimation, is 60 to 65%. The left ventricle has normal function. The left ventricle has no regional wall motion abnormalities. The average left ventricular global longitudinal strain is -21.3 %. Strain was performed and the global longitudinal strain is normal. The left ventricular internal cavity size was normal in size. There is mild left ventricular hypertrophy. Left ventricular diastolic parameters were normal. Right Ventricle: The right ventricular size is normal. No increase in right ventricular wall thickness. Right ventricular systolic function is normal. There is normal pulmonary artery systolic pressure. The tricuspid regurgitant velocity is 2.66 m/s, and  with an assumed right atrial pressure of 3 mmHg, the estimated right ventricular systolic pressure is 31.3 mmHg. Left Atrium: Left atrial size was normal in size. Right Atrium: Right atrial size was normal in size. Pericardium: Trivial pericardial effusion is present. Mitral Valve: The mitral valve is normal in structure. Trivial mitral valve regurgitation. No evidence of mitral valve stenosis. Tricuspid Valve: The tricuspid valve is normal in structure. Tricuspid valve regurgitation is trivial. Aortic Valve: The aortic valve is tricuspid. Aortic valve regurgitation is not visualized. Aortic valve sclerosis is present, with no  evidence of aortic valve stenosis. Aortic valve mean gradient measures 8.0 mmHg. Aortic valve peak gradient measures 14.4 mmHg. Aortic valve area, by VTI measures 2.20 cm. Pulmonic Valve: The pulmonic valve was not well visualized. Pulmonic valve regurgitation is trivial. Aorta: The aortic root and ascending aorta are structurally normal, with no evidence of dilitation. Venous: The inferior vena cava is normal in size with greater than 50% respiratory variability, suggesting right atrial pressure of 3 mmHg. IAS/Shunts: The interatrial septum was not well visualized.  LEFT VENTRICLE PLAX 2D LVIDd:         5.30 cm   Diastology LVIDs:         3.60 cm   LV e' medial:    8.81 cm/s LV PW:         1.00 cm   LV E/e' medial:  11.5 LV IVS:        1.00 cm   LV e' lateral:   9.46 cm/s LVOT diam:     2.00 cm   LV E/e' lateral: 10.7 LV SV:         74 LV SV Index:   36        2D Longitudinal Strain LVOT Area:     3.14 cm  2D Strain GLS Avg:     -21.3 %  RIGHT VENTRICLE RV Basal diam:  3.10 cm RV Mid diam:    3.10 cm RV S prime:     16.60 cm/s LEFT ATRIUM             Index        RIGHT ATRIUM  Index LA diam:        4.00 cm 1.95 cm/m   RA Area:     17.50 cm LA Vol (A2C):   48.0 ml 23.39 ml/m  RA Volume:   45.40 ml  22.12 ml/m LA Vol (A4C):   51.1 ml 24.90 ml/m LA Biplane Vol: 53.6 ml 26.12 ml/m  AORTIC VALVE AV Area (Vmax):    2.33 cm AV Area (Vmean):   2.23 cm AV Area (VTI):     2.20 cm AV Vmax:           190.00 cm/s AV Vmean:          131.000 cm/s AV VTI:            0.335 m AV Peak Grad:      14.4 mmHg AV Mean Grad:      8.0 mmHg LVOT Vmax:         141.00 cm/s LVOT Vmean:        93.100 cm/s LVOT VTI:          0.235 m LVOT/AV VTI ratio: 0.70  AORTA Ao Root diam: 2.80 cm Ao Asc diam:  3.20 cm MITRAL VALVE                TRICUSPID VALVE MV Area (PHT): 3.89 cm     TR Peak grad:   28.3 mmHg MV Decel Time: 195 msec     TR Vmax:        266.00 cm/s MV E velocity: 101.00 cm/s MV A velocity: 93.50 cm/s   SHUNTS MV E/A  ratio:  1.08         Systemic VTI:  0.24 m                             Systemic Diam: 2.00 cm Lonni Nanas MD Electronically signed by Lonni Nanas MD Signature Date/Time: 09/13/2024/2:42:12 PM    Final          Scheduled Meds:  amLODipine  10 mg Oral Daily   enoxaparin (LOVENOX) injection  50 mg Subcutaneous Q24H   ketorolac   30 mg Intravenous Q6H   loratadine  10 mg Oral Daily   montelukast  10 mg Oral QHS   mupirocin ointment  1 Application Nasal BID   rosuvastatin  10 mg Oral Daily   Continuous Infusions:   ceFAZolin  (ANCEF ) IV Stopped (09/15/24 9348)          Sophie Mao, MD [facility] 09/15/2024, 7:44 AM

## 2024-09-16 ENCOUNTER — Inpatient Hospital Stay (HOSPITAL_COMMUNITY)

## 2024-09-16 ENCOUNTER — Other Ambulatory Visit (HOSPITAL_COMMUNITY)

## 2024-09-16 ENCOUNTER — Inpatient Hospital Stay (HOSPITAL_COMMUNITY): Admitting: Certified Registered"

## 2024-09-16 ENCOUNTER — Encounter (HOSPITAL_COMMUNITY): Payer: Self-pay | Admitting: Cardiology

## 2024-09-16 ENCOUNTER — Encounter (HOSPITAL_COMMUNITY)
Admission: EM | Disposition: A | Discharge status: Home / Self Care | Payer: Self-pay | Source: Home / Self Care | Attending: Internal Medicine

## 2024-09-16 DIAGNOSIS — I361 Nonrheumatic tricuspid (valve) insufficiency: Secondary | ICD-10-CM | POA: Diagnosis not present

## 2024-09-16 DIAGNOSIS — J90 Pleural effusion, not elsewhere classified: Secondary | ICD-10-CM | POA: Diagnosis not present

## 2024-09-16 DIAGNOSIS — I340 Nonrheumatic mitral (valve) insufficiency: Secondary | ICD-10-CM

## 2024-09-16 DIAGNOSIS — R0600 Dyspnea, unspecified: Secondary | ICD-10-CM | POA: Diagnosis not present

## 2024-09-16 DIAGNOSIS — J811 Chronic pulmonary edema: Secondary | ICD-10-CM | POA: Diagnosis not present

## 2024-09-16 DIAGNOSIS — I509 Heart failure, unspecified: Secondary | ICD-10-CM | POA: Diagnosis not present

## 2024-09-16 DIAGNOSIS — A419 Sepsis, unspecified organism: Secondary | ICD-10-CM | POA: Diagnosis not present

## 2024-09-16 HISTORY — PX: TRANSESOPHAGEAL ECHOCARDIOGRAM (CATH LAB): EP1270

## 2024-09-16 LAB — BASIC METABOLIC PANEL WITH GFR
Anion gap: 12 (ref 5–15)
BUN: 16 mg/dL (ref 8–23)
CO2: 24 mmol/L (ref 22–32)
Calcium: 8 mg/dL — ABNORMAL LOW (ref 8.9–10.3)
Chloride: 108 mmol/L (ref 98–111)
Creatinine, Ser: 0.66 mg/dL (ref 0.44–1.00)
GFR, Estimated: >60 mL/min (ref >60)
Glucose, Bld: 110 mg/dL — ABNORMAL HIGH (ref 70–99)
Potassium: 3.7 mmol/L (ref 3.5–5.1)
Sodium: 143 mmol/L (ref 135–145)

## 2024-09-16 LAB — ECHO TEE

## 2024-09-16 SURGERY — TRANSESOPHAGEAL ECHOCARDIOGRAM (TEE) (CATHLAB)
Anesthesia: Monitor Anesthesia Care

## 2024-09-16 MED ORDER — PROPOFOL 500 MG/50ML IV EMUL
INTRAVENOUS | Status: DC | PRN
  Administered 2024-09-16: 40 mg via INTRAVENOUS
  Administered 2024-09-16: 120 ug/kg/min via INTRAVENOUS

## 2024-09-16 MED ORDER — GUAIFENESIN-DM 100-10 MG/5ML PO SYRP
5.0000 mL | ORAL_SOLUTION | ORAL | Status: DC | PRN
  Administered 2024-09-16 – 2024-09-18 (×10): 5 mL via ORAL
  Filled 2024-09-16 (×10): qty 10

## 2024-09-16 NOTE — TOC Progression Note (Signed)
 Transition of Care [facility]) - Progression Note    Patient Details  Name: Mary Mendez MRN: [id number] Date of Birth: 10-06-1961  Transition of Care [facility] CM/SW Contact  Ghazal Pevey, Nathanel, RN Phone Number: 09/16/2024, 1:33 PM  Clinical Narrative:  Await outcome of TEE. [employer] rep Pam following if long term iv abx needed.LVM w/dtr [place].      Expected Discharge Plan: Home/Self Care Barriers to Discharge: Continued Medical Work up               Expected Discharge Plan and Services   Discharge Planning Services: CM Consult   Living arrangements for the past 2 months: Single Family Home                                       Social Drivers of Health (SDOH) Interventions SDOH Screenings   Food Insecurity: No Food Insecurity (09/11/2024)  Housing: Low Risk  (09/11/2024)  Transportation Needs: No Transportation Needs (09/11/2024)  Utilities: Not At Risk (09/11/2024)  Depression (PHQ2-9): Low Risk  (06/11/2024)  Social Connections: Unknown (09/11/2024)  Tobacco Use: Low Risk  (09/11/2024)    Readmission Risk Interventions     No data to display

## 2024-09-16 NOTE — Progress Notes (Signed)
  Echocardiogram Echocardiogram Transesophageal has been performed.  Mary Mendez, RDCS 09/16/2024, 11:18 AM

## 2024-09-16 NOTE — Plan of Care (Signed)
  Problem: Education: Goal: Knowledge of General Education information will improve Description: Including pain rating scale, medication(s)/side effects and non-pharmacologic comfort measures Outcome: Progressing   Problem: Clinical Measurements: Goal: Ability to maintain clinical measurements within normal limits will improve Outcome: Progressing   Problem: Coping: Goal: Level of anxiety will decrease Outcome: Progressing   

## 2024-09-16 NOTE — Plan of Care (Signed)

## 2024-09-16 NOTE — Anesthesia Postprocedure Evaluation (Signed)
 Anesthesia Post Note  Patient: Mary Mendez  Procedure(s) Performed: TRANSESOPHAGEAL ECHOCARDIOGRAM     Patient location during evaluation: PACU Anesthesia Type: MAC Level of consciousness: awake and alert Pain management: pain level controlled Vital Signs Assessment: post-procedure vital signs reviewed and stable Respiratory status: spontaneous breathing, nonlabored ventilation, respiratory function stable and patient connected to nasal cannula oxygen Cardiovascular status: stable and blood pressure returned to baseline Postop Assessment: no apparent nausea or vomiting Anesthetic complications: no   No notable events documented.  Last Vitals:  Vitals:   09/16/24 1136 09/16/24 1411  BP: (!) 162/93 120/64  Pulse: 82 84  Resp: 19 20  Temp:  37.4 C  SpO2: 98% 97%    Last Pain:  Vitals:   09/16/24 1411  TempSrc: Oral  PainSc:                  Thom JONELLE Peoples

## 2024-09-16 NOTE — Transfer of Care (Signed)
 Immediate Anesthesia Transfer of Care Note  Patient: Mary Mendez  Procedure(s) Performed: TRANSESOPHAGEAL ECHOCARDIOGRAM  Patient Location: PACU  Anesthesia Type:MAC  Level of Consciousness: awake and drowsy  Airway & Oxygen Therapy: Patient Spontanous Breathing and Patient connected to face mask oxygen  Post-op Assessment: Report given to RN and Post -op Vital signs reviewed and stable  Post vital signs: Reviewed and stable  Last Vitals:  Vitals Value Taken Time  BP 111/86 09/16/24 11:16  Temp 36.6 C 09/16/24 11:16  Pulse 72 09/16/24 11:17  Resp 19 09/16/24 11:17  SpO2 100 % 09/16/24 11:17  Vitals shown include unfiled device data.  Last Pain:  Vitals:   09/16/24 1116  TempSrc: Tympanic  PainSc: Asleep      Patients Stated Pain Goal: 0 (09/13/24 1052)  Complications: No notable events documented.

## 2024-09-16 NOTE — Progress Notes (Signed)
 PROGRESS NOTE    Mary Mendez  FMW:[id number] DOB: 1961-10-22 DOA: 09/10/2024 PCP: [name], MD   Brief Narrative:  63 year old female with history of left breast cancer, asthma, hyperlipidemia, hypertension, anxiety presented with fever and blood culture growing MSSA.  On presentation, WBC was 4.3, COVID/influenza/RSV PCR negative.  AST of 53 and ALT of 54.  Chest x-ray showed mild central vascular congestion with interstitial edema.  CT L-spine showed mild degenerative changes of the visualized thoracolumbar spine.  CT chest/abdomen/pelvis with contrast showed mild subcutaneous stranding along the right chest port, correlate for cellulitis; new 8 mm right lower lobe pulmonary nodule.  She was started on IV antibiotics.  ID was consulted.  Port-A-Cath was removed by general surgery on 09/14/2024.  TEE planned for today.  Assessment & Plan:   MSSA bacteremia Sepsis: Present on admission -Presented with fever, tachypnea with MSSA bacteremia and Port-A-Cath site infection. - Possibly related to Port-A-Cath infection.  Continue IV cefazolin .  2D echo showed EF of 60 to 65% with no vegetations.  MRI lumbar spine did not show osteomyelitis or discitis.  Port-A-Cath was removed by general surgery on 09/14/2024. Follow repeat blood cultures from 09/13/2024: Negative so far. -ID following - TEE planned for today.  Left breast cancer -Outpatient follow-up with oncology  Essential hypertension Hyperlipidemia - Continue amlodipine and statin  Hyponatremia - Resolved  Hyperkalemia - Resolved  Hypokalemia - Resolved  Thrombocytopenia - Mild.  No signs of bleeding.  Monitor intermittently  Mildly elevated LFTs - Resolved  Obesity class II - Outpatient follow-up  Mild intermittent asthma - Continue loratadine and montelukast.  Continue as needed albuterol  nebulizer.  No longer using Advair   DVT prophylaxis: Lovenox Code Status: Full Family Communication: Daughter at  bedside  Disposition Plan: Status is: Inpatient Remains inpatient appropriate because: Of severity of illness    Consultants: ID/general surgery  Procedures: 2D echo Antimicrobials: Cefazolin  from 09/11/2024 onwards   Subjective: Patient seen and examined at bedside.  Denies worsening shortness of breath, fever or vomiting.  Has intermittent cough Objective: Vitals:   09/15/24 1558 09/15/24 2058 09/16/24 0500 09/16/24 0539  BP:  134/76  (!) 143/74  Pulse:  82  74  Resp:  18  18  Temp:  98.5 F (36.9 C)  98.6 F (37 C)  TempSrc:  Oral  Oral  SpO2: 95% 97%  98%  Weight:   102 kg   Height:        Intake/Output Summary (Last 24 hours) at 09/16/2024 0746 Last data filed at 09/16/2024 0300 Gross per 24 hour  Intake 606.17 ml  Output --  Net 606.17 ml   Filed Weights   09/13/24 0606 09/15/24 0500 09/16/24 0500  Weight: 98.9 kg 101.1 kg 102 kg    Examination:  General: Remains on room air and in no distress respiratory: Bilateral decreased breath sounds at bases with scattered crackles  CVS: S1-S2 heard; rate mostly controlled abdominal: Soft, obese, nontender, remains slightly distended,, no organomegaly; bowel sounds are normally heard  extremities: Mild lower extremity edema; no clubbing   Data Reviewed: I have personally reviewed following labs and imaging studies  CBC: Recent Labs  Lab 09/10/24 1855 09/12/24 0604 09/13/24 0052  WBC 4.3 8.2 9.3  NEUTROABS 2.4 4.8 6.3  HGB 13.2 12.7 11.0*  HCT 39.2 40.0 33.3*  MCV 84.1 88.7 85.4  PLT 196 144* 149*   Basic Metabolic Panel: Recent Labs  Lab 09/10/24 1855 09/12/24 0604 09/13/24 0052 09/14/24 0535 09/16/24 0505  NA 132* 134* 137 138 143  K 5.5* 3.4* 3.0* 3.4* 3.7  CL 97* 101 103 102 108  CO2 23 21* 23 26 24   GLUCOSE 138* 99 123* 120* 110*  BUN 12 8 7* 8 16  CREATININE 0.67 0.62 0.56 0.54 0.66  CALCIUM 8.8* 8.2* 8.2* 8.1* 8.0*  MG  --   --  2.3 2.2  --    GFR: Estimated Creatinine Clearance:  87.4 mL/min (by C-G formula based on SCr of 0.66 mg/dL). Liver Function Tests: Recent Labs  Lab 09/10/24 1855 09/12/24 0604 09/13/24 0052  AST 53* 50* 39  ALT 54* 50* 35  ALKPHOS 109 105 103  BILITOT 0.7 0.5 0.4  PROT 6.5 6.0* 5.5*  ALBUMIN 3.8 3.1* 2.9*   No results for input(s): LIPASE, AMYLASE in the last 168 hours. No results for input(s): AMMONIA in the last 168 hours. Coagulation Profile: Recent Labs  Lab 09/10/24 1945  INR 0.9   Cardiac Enzymes: No results for input(s): CKTOTAL, CKMB, CKMBINDEX, TROPONINI in the last 168 hours. BNP (last 3 results) No results for input(s): PROBNP in the last 8760 hours. HbA1C: No results for input(s): HGBA1C in the last 72 hours. CBG: No results for input(s): GLUCAP in the last 168 hours. Lipid Profile: No results for input(s): CHOL, HDL, LDLCALC, TRIG, CHOLHDL, LDLDIRECT in the last 72 hours. Thyroid Function Tests: No results for input(s): TSH, T4TOTAL, FREET4, T3FREE, THYROIDAB in the last 72 hours. Anemia Panel: No results for input(s): VITAMINB12, FOLATE, FERRITIN, TIBC, IRON, RETICCTPCT in the last 72 hours. Sepsis Labs: Recent Labs  Lab 09/10/24 1855  LATICACIDVEN 1.5    Recent Results (from the past 240 hours)  Culture, blood (Routine x 2)     Status: Abnormal   Collection Time: 09/10/24  6:49 PM   Specimen: BLOOD  Result Value Ref Range Status   Specimen Description   Final    BLOOD RIGHT ANTECUBITAL Performed at [facility], [address] [place], [place] [address]    Special Requests   Final    Blood Culture adequate volume BOTTLES DRAWN AEROBIC AND ANAEROBIC Performed at [facility], [address], [place], [place] [address]    Culture  Setup Time   Final    GRAM POSITIVE COCCI IN CLUSTERS IN BOTH AEROBIC AND ANAEROBIC BOTTLES CRITICAL RESULT CALLED TO, READ BACK BY AND VERIFIED WITH: PHARMD CRYSTAL ROBERTSON  ON 09/11/24 @ 1320 BY DRT Performed at [facility], [address] [place], [place] [address]    Culture STAPHYLOCOCCUS AUREUS (A)  Final   Report Status 09/13/2024 FINAL  Final   Organism ID, Bacteria STAPHYLOCOCCUS AUREUS  Final      Susceptibility   Staphylococcus aureus - MIC*    CIPROFLOXACIN <=0.5 SENSITIVE Sensitive     ERYTHROMYCIN <=0.25 SENSITIVE Sensitive     GENTAMICIN <=0.5 SENSITIVE Sensitive     OXACILLIN <=0.25 SENSITIVE Sensitive     TETRACYCLINE <=1 SENSITIVE Sensitive     VANCOMYCIN <=0.5 SENSITIVE Sensitive     TRIMETH/SULFA <=10 SENSITIVE Sensitive     CLINDAMYCIN <=0.25 SENSITIVE Sensitive     RIFAMPIN <=0.5 SENSITIVE Sensitive     Inducible Clindamycin NEGATIVE Sensitive     LINEZOLID 1 SENSITIVE Sensitive     * STAPHYLOCOCCUS AUREUS  Blood Culture ID Panel (Reflexed)     Status: Abnormal   Collection Time: 09/10/24  6:49 PM  Result Value Ref Range Status   Enterococcus faecalis NOT DETECTED NOT DETECTED Final  Enterococcus Faecium NOT DETECTED NOT DETECTED Final   Listeria monocytogenes NOT DETECTED NOT DETECTED Final   Staphylococcus species DETECTED (A) NOT DETECTED Final    Comment: CRITICAL RESULT CALLED TO, READ BACK BY AND VERIFIED WITH: PHARMD CRYSTAL ROBERTSON ON 09/11/24 @ 1320 BY DRT    Staphylococcus aureus (BCID) DETECTED (A) NOT DETECTED Final    Comment: CRITICAL RESULT CALLED TO, READ BACK BY AND VERIFIED WITH: PHARMD CRYSTAL ROBERTSON ON 09/11/24 @ 1320 BY DRT    Staphylococcus epidermidis NOT DETECTED NOT DETECTED Final   Staphylococcus lugdunensis NOT DETECTED NOT DETECTED Final   Streptococcus species NOT DETECTED NOT DETECTED Final   Streptococcus agalactiae NOT DETECTED NOT DETECTED Final   Streptococcus pneumoniae NOT DETECTED NOT DETECTED Final   Streptococcus pyogenes NOT DETECTED NOT DETECTED Final   A.calcoaceticus-baumannii NOT DETECTED NOT DETECTED Final   Bacteroides fragilis NOT DETECTED NOT DETECTED Final    Enterobacterales NOT DETECTED NOT DETECTED Final   Enterobacter cloacae complex NOT DETECTED NOT DETECTED Final   Escherichia coli NOT DETECTED NOT DETECTED Final   Klebsiella aerogenes NOT DETECTED NOT DETECTED Final   Klebsiella oxytoca NOT DETECTED NOT DETECTED Final   Klebsiella pneumoniae NOT DETECTED NOT DETECTED Final   Proteus species NOT DETECTED NOT DETECTED Final   Salmonella species NOT DETECTED NOT DETECTED Final   Serratia marcescens NOT DETECTED NOT DETECTED Final   Haemophilus influenzae NOT DETECTED NOT DETECTED Final   Neisseria meningitidis NOT DETECTED NOT DETECTED Final   Pseudomonas aeruginosa NOT DETECTED NOT DETECTED Final   Stenotrophomonas maltophilia NOT DETECTED NOT DETECTED Final   Candida albicans NOT DETECTED NOT DETECTED Final   Candida auris NOT DETECTED NOT DETECTED Final   Candida glabrata NOT DETECTED NOT DETECTED Final   Candida krusei NOT DETECTED NOT DETECTED Final   Candida parapsilosis NOT DETECTED NOT DETECTED Final   Candida tropicalis NOT DETECTED NOT DETECTED Final   Cryptococcus neoformans/gattii NOT DETECTED NOT DETECTED Final   Meth resistant mecA/C and MREJ NOT DETECTED NOT DETECTED Final    Comment: Performed at [facility], [address], [place], [place] [address]  Resp panel by RT-PCR (RSV, Flu A&B, Covid) Anterior Nasal Swab     Status: None   Collection Time: 09/10/24  8:36 PM   Specimen: Anterior Nasal Swab  Result Value Ref Range Status   SARS Coronavirus 2 by RT PCR NEGATIVE NEGATIVE Final    Comment: (NOTE) SARS-CoV-2 target nucleic acids are NOT DETECTED.  The SARS-CoV-2 RNA is generally detectable in upper respiratory specimens during the acute phase of infection. The lowest concentration of SARS-CoV-2 viral copies this assay can detect is 138 copies/mL. A negative result does not preclude SARS-Cov-2 infection and should not be used as the sole basis for treatment or other patient management decisions. A negative  result may occur with  improper specimen collection/handling, submission of specimen other than nasopharyngeal swab, presence of viral mutation(s) within the areas targeted by this assay, and inadequate number of viral copies(<138 copies/mL). A negative result must be combined with clinical observations, patient history, and epidemiological information. The expected result is Negative.  Fact Sheet for Patients:  bloggercourse.com  Fact Sheet for Healthcare Providers:  seriousbroker.it  This test is no t yet approved or cleared by the [place]  FDA and  has been authorized for detection and/or diagnosis of SARS-CoV-2 by FDA under an Emergency Use Authorization (EUA). This EUA will remain  in effect (meaning this test can be used) for the duration  of the COVID-19 declaration under Section 564(b)(1) of the Act, 21 U.S.C.section 360bbb-3(b)(1), unless the authorization is terminated  or revoked sooner.       Influenza A by PCR NEGATIVE NEGATIVE Final   Influenza B by PCR NEGATIVE NEGATIVE Final    Comment: (NOTE) The Xpert Xpress SARS-CoV-2/FLU/RSV plus assay is intended as an aid in the diagnosis of influenza from Nasopharyngeal swab specimens and should not be used as a sole basis for treatment. Nasal washings and aspirates are unacceptable for Xpert Xpress SARS-CoV-2/FLU/RSV testing.  Fact Sheet for Patients: bloggercourse.com  Fact Sheet for Healthcare Providers: seriousbroker.it  This test is not yet approved or cleared by the [place]  FDA and has been authorized for detection and/or diagnosis of SARS-CoV-2 by FDA under an Emergency Use Authorization (EUA). This EUA will remain in effect (meaning this test can be used) for the duration of the COVID-19 declaration under Section 564(b)(1) of the Act, 21 U.S.C. section 360bbb-3(b)(1), unless the authorization is  terminated or revoked.     Resp Syncytial Virus by PCR NEGATIVE NEGATIVE Final    Comment: (NOTE) Fact Sheet for Patients: bloggercourse.com  Fact Sheet for Healthcare Providers: seriousbroker.it  This test is not yet approved or cleared by the [place]  FDA and has been authorized for detection and/or diagnosis of SARS-CoV-2 by FDA under an Emergency Use Authorization (EUA). This EUA will remain in effect (meaning this test can be used) for the duration of the COVID-19 declaration under Section 564(b)(1) of the Act, 21 U.S.C. section 360bbb-3(b)(1), unless the authorization is terminated or revoked.  Performed at [facility], [address], [place], [place] [address]   Culture, blood (Routine X 2) w Reflex to ID Panel     Status: None (Preliminary result)   Collection Time: 09/13/24 12:52 AM   Specimen: BLOOD RIGHT ARM  Result Value Ref Range Status   Specimen Description   Final    BLOOD RIGHT ARM Performed at [facility], [address] [place], [place] [address]    Special Requests   Final    BOTTLES DRAWN AEROBIC ONLY Blood Culture adequate volume Performed at [facility], [address] [place], [place] [address]    Culture   Final    NO GROWTH 2 DAYS Performed at [facility], [address] [place], [place] [address]    Report Status PENDING  Incomplete  Culture, blood (Routine X 2) w Reflex to ID Panel     Status: None (Preliminary result)   Collection Time: 09/13/24 12:52 AM   Specimen: BLOOD RIGHT ARM  Result Value Ref Range Status   Specimen Description   Final    BLOOD RIGHT ARM Performed at [facility], [address], [place], [place] [address]    Special Requests   Final    BOTTLES DRAWN AEROBIC ONLY Blood Culture adequate volume Performed at [facility], [address] [place], [place] [address]    Culture    Final    NO GROWTH 2 DAYS Performed at [facility], [address] [place], [place] [address]    Report Status PENDING  Incomplete  Surgical PCR screen     Status: None   Collection Time: 09/14/24  1:35 AM   Specimen: Nasal Mucosa; Nasal Swab  Result Value Ref Range Status   MRSA, PCR NEGATIVE NEGATIVE Final   Staphylococcus aureus NEGATIVE NEGATIVE Final    Comment: (NOTE) The Xpert SA  Assay (FDA approved for NASAL specimens in patients 16 years of age and older), is one component of a comprehensive surveillance program. It is not intended to diagnose infection nor to guide or monitor treatment. Performed at [facility], [address], [place], [place] [address]   Aerobic/Anaerobic Culture w Gram Stain (surgical/deep wound)     Status: None (Preliminary result)   Collection Time: 09/14/24  9:20 AM   Specimen: Path fluid; Body Fluid  Result Value Ref Range Status   Specimen Description   Final    FLUID Performed at [facility], [address], [place], [place] [address]    Special Requests   Final    NONE Performed at [facility], [address], [place], [place] [address]    Gram Stain NO WBC SEEN RARE GRAM POSITIVE COCCI IN PAIRS   Final   Culture   Final    RARE STAPHYLOCOCCUS AUREUS SUSCEPTIBILITIES TO FOLLOW Performed at [facility], [address], [place], [place] [address]    Report Status PENDING  Incomplete  Culture, blood (Routine X 2) w Reflex to ID Panel     Status: None (Preliminary result)   Collection Time: 09/15/24 10:55 AM   Specimen: BLOOD  Result Value Ref Range Status   Specimen Description   Final    BLOOD BLOOD RIGHT HAND Performed at [facility], [address] [place], [place] [address]    Special Requests   Final    BOTTLES DRAWN AEROBIC ONLY Blood Culture results may not be optimal due to an inadequate volume of blood received in culture  bottles Performed at [facility], [address], [place], [place] [address]    Culture   Final    NO GROWTH <12 HOURS Performed at [facility], [address] [place], [place] [address]    Report Status PENDING  Incomplete  Culture, blood (Routine X 2) w Reflex to ID Panel     Status: None (Preliminary result)   Collection Time: 09/15/24 11:01 AM   Specimen: BLOOD  Result Value Ref Range Status   Specimen Description   Final    BLOOD BLOOD RIGHT HAND Performed at [facility], [address], [place], [place] [address]    Special Requests   Final    BOTTLES DRAWN AEROBIC ONLY Blood Culture results may not be optimal due to an inadequate volume of blood received in culture bottles Performed at [facility], [address], [place], [place] [address]    Culture   Final    NO GROWTH <12 HOURS Performed at [facility], [address] [place], [place] [address]    Report Status PENDING  Incomplete         Radiology Studies: MR Lumbar Spine W Wo Contrast Result Date: 09/14/2024 EXAM: MRI LUMBAR SPINE 09/14/2024 01:10:41 PM TECHNIQUE: Multiplanar multisequence MRI of the lumbar spine was performed without and with the administration of intravenous contrast. COMPARISON: None available. CLINICAL HISTORY: bacteremia, r/o discitis and osteomyelitis FINDINGS: BONES AND ALIGNMENT: Normal alignment. Normal vertebral body heights. Bone marrow signal is unremarkable. SPINAL CORD: The conus terminates normally. SOFT TISSUES: No paraspinal mass. L1-L2: No significant disc herniation. No spinal canal stenosis or neural foraminal narrowing. L2-L3: No significant disc herniation. No spinal canal stenosis or neural foraminal narrowing. L3-L4: No significant disc herniation. No spinal canal stenosis or neural foraminal narrowing. L4-L5: Facet degenerative changes  are present. No focal disc protrusion or stenosis. L5-S1: Facet  degenerative changes are present. No focal disc protrusion or stenosis. IMPRESSION: 1. No evidence of discitis or osteomyelitis. Electronically signed by: Lonni Necessary MD 09/14/2024 01:17 PM EST RP Workstation: [id number]         Scheduled Meds:  amLODipine  10 mg Oral Daily   enoxaparin (LOVENOX) injection  50 mg Subcutaneous Q24H   ketorolac   30 mg Intravenous Q6H   loratadine  10 mg Oral Daily   montelukast  10 mg Oral QHS   mupirocin ointment  1 Application Nasal BID   rosuvastatin  10 mg Oral Daily   Continuous Infusions:  sodium chloride  20 mL/hr at 09/15/24 1858    ceFAZolin  (ANCEF ) IV 2 g (09/16/24 0535)          Sophie Mao, MD [facility] 09/16/2024, 7:46 AM

## 2024-09-16 NOTE — Progress Notes (Signed)
 PHARMACY CONSULT NOTE FOR:  OUTPATIENT  PARENTERAL ANTIBIOTIC THERAPY (OPAT)  Indication: MSSA bacteremia Regimen: Cefazolin  2g IV every 8 hours End date: 09/27/24 (2 weeks from [facility] removal 11/16)  IV antibiotic discharge orders are pended. To discharging provider:  please sign these orders via discharge navigator,  Select New Orders & click on the button choice - Manage This Unsigned Work.     Thank you for allowing pharmacy to be a part of this patient's care.  Almarie Lunger, PharmD, BCPS, BCIDP Infectious Diseases Clinical Pharmacist 09/17/2024 11:08 AM   **Pharmacist phone directory can now be found on [email or web address] ([email or web address]).  Listed under [facility].

## 2024-09-16 NOTE — Anesthesia Preprocedure Evaluation (Signed)
 Anesthesia Evaluation  Patient identified by MRN, date of birth, ID band Patient awake    Reviewed: Allergy & Precautions, NPO status , Patient's Chart, lab work & pertinent test results  History of Anesthesia Complications Negative for: history of anesthetic complications  Airway Mallampati: II  TM Distance: >3 FB Neck ROM: Full    Dental no notable dental hx. (+) Dental Advisory Given,    Pulmonary asthma , COPD,  COPD inhaler   Pulmonary exam normal breath sounds clear to auscultation       Cardiovascular hypertension, Pt. on medications (-) angina (-) Past MI Normal cardiovascular exam Rhythm:Regular Rate:Normal  09/13/2024 ECHO: EF 60-65%, normal LVF, mild LVH, normal RVF, trivial MR   Neuro/Psych  Headaches  Anxiety        GI/Hepatic negative GI ROS, Neg liver ROS,neg GERD  ,,  Endo/Other  BMI 36  Renal/GU negative Renal ROS     Musculoskeletal   Abdominal   Peds  Hematology   Anesthesia Other Findings Bacteremia  Reproductive/Obstetrics breast cancer                              Anesthesia Physical Anesthesia Plan  ASA: 3  Anesthesia Plan: MAC   Post-op Pain Management: Minimal or no pain anticipated   Induction: Intravenous  PONV Risk Score and Plan: 2 and TIVA and Treatment may vary due to age or medical condition  Airway Management Planned: Natural Airway and Simple Face Mask  Additional Equipment: None  Intra-op Plan:   Post-operative Plan:   Informed Consent: I have reviewed the patients History and Physical, chart, labs and discussed the procedure including the risks, benefits and alternatives for the proposed anesthesia with the patient or authorized representative who has indicated his/her understanding and acceptance.     Dental advisory given  Plan Discussed with: CRNA  Anesthesia Plan Comments:          Anesthesia Quick Evaluation

## 2024-09-16 NOTE — Interval H&P Note (Signed)
 History and Physical Interval Note:  09/16/2024 10:39 AM  Mary Mendez  has presented today for surgery, with the diagnosis of Bacteremia.  The various methods of treatment have been discussed with the patient and family. After consideration of risks, benefits and other options for treatment, the patient has consented to  Procedure(s): TRANSESOPHAGEAL ECHOCARDIOGRAM (N/A) as a surgical intervention.  The patient's history has been reviewed, patient examined, no change in status, stable for surgery.  I have reviewed the patient's chart and labs.  Questions were answered to the patient's satisfaction.     Wilbert Bihari

## 2024-09-17 ENCOUNTER — Other Ambulatory Visit: Payer: Self-pay

## 2024-09-17 ENCOUNTER — Encounter: Payer: Self-pay | Admitting: Hematology and Oncology

## 2024-09-17 ENCOUNTER — Other Ambulatory Visit (HOSPITAL_COMMUNITY): Payer: Self-pay

## 2024-09-17 ENCOUNTER — Telehealth (HOSPITAL_COMMUNITY): Payer: Self-pay | Admitting: Pharmacy Technician

## 2024-09-17 DIAGNOSIS — C50212 Malignant neoplasm of upper-inner quadrant of left female breast: Secondary | ICD-10-CM

## 2024-09-17 DIAGNOSIS — B9561 Methicillin susceptible Staphylococcus aureus infection as the cause of diseases classified elsewhere: Secondary | ICD-10-CM | POA: Diagnosis not present

## 2024-09-17 DIAGNOSIS — I10 Essential (primary) hypertension: Secondary | ICD-10-CM

## 2024-09-17 DIAGNOSIS — R059 Cough, unspecified: Secondary | ICD-10-CM

## 2024-09-17 DIAGNOSIS — R7989 Other specified abnormal findings of blood chemistry: Secondary | ICD-10-CM

## 2024-09-17 DIAGNOSIS — E8770 Fluid overload, unspecified: Secondary | ICD-10-CM

## 2024-09-17 DIAGNOSIS — J4520 Mild intermittent asthma, uncomplicated: Secondary | ICD-10-CM

## 2024-09-17 DIAGNOSIS — M545 Low back pain, unspecified: Secondary | ICD-10-CM | POA: Diagnosis not present

## 2024-09-17 DIAGNOSIS — E875 Hyperkalemia: Secondary | ICD-10-CM

## 2024-09-17 DIAGNOSIS — R918 Other nonspecific abnormal finding of lung field: Secondary | ICD-10-CM

## 2024-09-17 DIAGNOSIS — E66812 Obesity, class 2: Secondary | ICD-10-CM

## 2024-09-17 DIAGNOSIS — R7401 Elevation of levels of liver transaminase levels: Secondary | ICD-10-CM

## 2024-09-17 DIAGNOSIS — R7881 Bacteremia: Secondary | ICD-10-CM | POA: Diagnosis not present

## 2024-09-17 DIAGNOSIS — Z170 Estrogen receptor positive status [ER+]: Secondary | ICD-10-CM

## 2024-09-17 LAB — RENAL FUNCTION PANEL
Albumin: 3.1 g/dL — ABNORMAL LOW (ref 3.5–5.0)
Anion gap: 12 (ref 5–15)
BUN: 8 mg/dL (ref 8–23)
CO2: 25 mmol/L (ref 22–32)
Calcium: 8.3 mg/dL — ABNORMAL LOW (ref 8.9–10.3)
Chloride: 102 mmol/L (ref 98–111)
Creatinine, Ser: 0.61 mg/dL (ref 0.44–1.00)
GFR, Estimated: >60 mL/min (ref >60)
Glucose, Bld: 94 mg/dL (ref 70–99)
Phosphorus: 3.7 mg/dL (ref 2.5–4.6)
Potassium: 3.8 mmol/L (ref 3.5–5.1)
Sodium: 138 mmol/L (ref 135–145)

## 2024-09-17 LAB — CBC WITH DIFFERENTIAL/PLATELET
Abs Immature Granulocytes: 1 K/uL — ABNORMAL HIGH (ref 0.00–0.07)
Basophils Absolute: 0.1 K/uL (ref 0.0–0.1)
Basophils Relative: 1 %
Eosinophils Absolute: 0 K/uL (ref 0.0–0.5)
Eosinophils Relative: 0 %
HCT: 34.3 % — ABNORMAL LOW (ref 36.0–46.0)
Hemoglobin: 11.6 g/dL — ABNORMAL LOW (ref 12.0–15.0)
Immature Granulocytes: 8 %
Lymphocytes Relative: 13 %
Lymphs Abs: 1.7 K/uL (ref 0.7–4.0)
MCH: 28.7 pg (ref 26.0–34.0)
MCHC: 33.8 g/dL (ref 30.0–36.0)
MCV: 84.9 fL (ref 80.0–100.0)
Monocytes Absolute: 0.5 K/uL (ref 0.1–1.0)
Monocytes Relative: 4 %
Neutro Abs: 9.4 K/uL — ABNORMAL HIGH (ref 1.7–7.7)
Neutrophils Relative %: 74 %
Platelets: 217 K/uL (ref 150–400)
RBC: 4.04 MIL/uL (ref 3.87–5.11)
RDW: 15.7 % — ABNORMAL HIGH (ref 11.5–15.5)
Smear Review: NORMAL
WBC: 12.7 K/uL — ABNORMAL HIGH (ref 4.0–10.5)
nRBC: 0.2 % (ref 0.0–0.2)

## 2024-09-17 LAB — MAGNESIUM: Magnesium: 2.4 mg/dL (ref 1.7–2.4)

## 2024-09-17 MED ORDER — POTASSIUM CHLORIDE CRYS ER 20 MEQ PO TBCR
40.0000 meq | EXTENDED_RELEASE_TABLET | Freq: Once | ORAL | Status: AC
  Administered 2024-09-17: 40 meq via ORAL
  Filled 2024-09-17: qty 2

## 2024-09-17 MED ORDER — FLUTICASONE FUROATE-VILANTEROL 200-25 MCG/ACT IN AEPB
1.0000 | INHALATION_SPRAY | Freq: Every day | RESPIRATORY_TRACT | Status: DC
  Administered 2024-09-17 – 2024-09-18 (×2): 1 via RESPIRATORY_TRACT
  Filled 2024-09-17: qty 28

## 2024-09-17 MED ORDER — FUROSEMIDE 10 MG/ML IJ SOLN
40.0000 mg | Freq: Two times a day (BID) | INTRAMUSCULAR | Status: AC
  Administered 2024-09-17 – 2024-09-18 (×2): 40 mg via INTRAVENOUS
  Filled 2024-09-17 (×2): qty 4

## 2024-09-17 MED ORDER — BUSPIRONE HCL 5 MG PO TABS
10.0000 mg | ORAL_TABLET | Freq: Three times a day (TID) | ORAL | Status: DC | PRN
  Administered 2024-09-18: 10 mg via ORAL
  Filled 2024-09-17: qty 2

## 2024-09-17 MED ORDER — TRETINOIN 0.1 % EX CREA: 1.0000 | TOPICAL_CREAM | Freq: Every day | CUTANEOUS | Status: DC

## 2024-09-17 NOTE — Progress Notes (Signed)
 [facility]  Date of Admission:  09/10/2024     Principal Problem:   Sepsis [facility]) Active Problems:   Malignant neoplasm of upper-inner quadrant of left breast in female, estrogen receptor positive (HCC)   Class 2 obesity   Low back pain   Transaminitis   Essential hypertension   Hyperkalemia   MSSA bacteremia   Mild intermittent asthma          Assessment: 63 year old female with history of left breast cancer status post surgery on chemotherapy has a port, hypertension hyperlipidemia, asthma presented with fever and low back pain, noticed swelling at port site. Found to have: #MSSA bacteremia secondary to Port-A-Cath infection with associated cellulitis #Low back pain #Elevated LFT improving #Thrombocytopenia improving #New 8 mm solid right lower lobe pulmonary nodule concerning in the setting of breast cancer - Port removed on 11/17 with Dr. Belinda.  Or notable for 20 cc of purulent fluid that was evacuated cultures growing Staph aureus suspect MSSA - TTE without vegetation - MRI L-spine without evidence of discitis or osteomyelitis - TEE no vegetation  plan: - Continue cefazolin , plan on 2 weeks form port removal on 11/16(repeat blood Cx from 11/17 ng x2d) -Communicated plan to primary -PICC placement ordered(my need ir for tunneled line per picc team) -F/U with ID on 12/17 to do Bcx off of abx -ID will SO OPAT ORDERS:  Diagnosis: MSSA bacteremia 2/2 port infection   No Known Allergies   Discharge antibiotics to be given via PICC line:  Per pharmacy protocol cefazolin  22g q8   Duration:  2 weeks End Date: 11/29  [facility] Care Per Protocol with [employer] Use: Home health RN for IV administration and teaching, line care and labs.    Labs weekly while on IV antibiotics: x__ CBC with differential __ BMP **TWICE WEEKLY ON VANCOMYCIN   _x_ CMP _x_ CRP __ ESR x__ Vancomycin  trough TWICE WEEKLY __ CK  x__ Please pull PIC at  completion of IV antibiotics __ Please leave PIC in place until doctor has seen patient or been notified  Fax weekly labs to [phone]  Clinic Follow Up Appt: 12/17  @ [facility] with Dr. Dea    Microbiology:   Antibiotics: Cefazolin  Cultures: Blood 11/12 1 out of 1 MSSA 11/15 no growth 11/17  Urine  Other 11/16 MSSA  SUBJECTIVE: Sitting at the edge of the bed.  No new complaints. Interval: Afebrile overnight.    Review of Systems: Review of Systems  All other systems reviewed and are negative.    Scheduled Meds:  amLODipine   10 mg Oral Daily   enoxaparin  (LOVENOX ) injection  50 mg Subcutaneous Q24H   fluticasone  furoate-vilanterol  1 puff Inhalation Daily   furosemide   40 mg Intravenous Q12H   ketorolac   30 mg Intravenous Q6H   loratadine   10 mg Oral Daily   montelukast   10 mg Oral QHS   mupirocin  ointment  1 Application Nasal BID   rosuvastatin   10 mg Oral Daily   Continuous Infusions:   ceFAZolin  (ANCEF ) IV 2 g (09/17/24 0647)   PRN Meds:.acetaminophen  **OR** acetaminophen , albuterol , busPIRone , guaiFENesin -dextromethorphan , ondansetron  **OR** ondansetron  (ZOFRAN ) IV, mouth rinse, oxyCODONE , prochlorperazine , zolpidem  No Known Allergies  OBJECTIVE: Vitals:   09/17/24 0000 09/17/24 0500 09/17/24 0604 09/17/24 0920  BP:   (!) 153/86   Pulse:   77   Resp: (!) 23  18   Temp:   98.2 F (36.8 C)   TempSrc:  SpO2:   95% 97%  Weight:  102 kg    Height:       Body mass index is 37.42 kg/m.  Physical Exam Constitutional:      Appearance: Normal appearance.  HENT:     Head: Normocephalic and atraumatic.     Right Ear: Tympanic membrane normal.     Left Ear: Tympanic membrane normal.     Nose: Nose normal.     Mouth/Throat:     Mouth: Mucous membranes are moist.  Eyes:     Extraocular Movements: Extraocular movements intact.     Conjunctiva/sclera: Conjunctivae normal.     Pupils: Pupils are equal, round, and reactive to light.   Cardiovascular:     Rate and Rhythm: Normal rate and regular rhythm.     Heart sounds: No murmur heard.    No friction rub. No gallop.     Comments: Chest wound with drain Pulmonary:     Effort: Pulmonary effort is normal.     Breath sounds: Normal breath sounds.  Abdominal:     General: Abdomen is flat.     Palpations: Abdomen is soft.  Musculoskeletal:        General: Normal range of motion.  Skin:    General: Skin is warm and dry.  Neurological:     General: No focal deficit present.     Mental Status: She is alert and oriented to person, place, and time.  Psychiatric:        Mood and Affect: Mood normal.       Lab Results Lab Results  Component Value Date   WBC 12.7 (H) 09/17/2024   HGB 11.6 (L) 09/17/2024   HCT 34.3 (L) 09/17/2024   MCV 84.9 09/17/2024   PLT 217 09/17/2024    Lab Results  Component Value Date   CREATININE 0.61 09/17/2024   BUN 8 09/17/2024   NA 138 09/17/2024   K 3.8 09/17/2024   CL 102 09/17/2024   CO2 25 09/17/2024    Lab Results  Component Value Date   ALT 35 09/13/2024   AST 39 09/13/2024   ALKPHOS 103 09/13/2024   BILITOT 0.4 09/13/2024        Loney Stank, MD [facility] 09/17/2024, 1:24 PM Evaluation of this patient requires complex antimicrobial therapy evaluation and counseling + isolation needs for disease transmission risk assessment and mitigation

## 2024-09-17 NOTE — Telephone Encounter (Signed)
 Patient Product/process Development Scientist completed.    The patient is insured through [facility]. Patient has [employer], may use a copay card, and/or apply for patient assistance if available.    Ran test claim for Breo Ellipta 200-25 mcg and the current 30 day co-pay is $198.81.   This test claim was processed through [facility]- copay amounts may vary at other pharmacies due to pharmacy/plan contracts, or as the patient moves through the different stages of their insurance plan.     Reyes Sharps, CPHT Pharmacy Technician Patient Advocate Specialist Lead [facility] Patient Advocate Team Direct Number: [phone]  Fax: [phone]

## 2024-09-17 NOTE — Progress Notes (Signed)
 PICC order received for prolonged intravenous therapies. Pt noted to have a right port a cath infection with removal on 09/14/24. Left arm restricted due to breast cancer. PICC team unable to place PICC line. Recommended IR for alternative central access. Primary RN, Dr. Toribio Hummer and Dr. Loney Stank notified.

## 2024-09-17 NOTE — Progress Notes (Addendum)
 PROGRESS NOTE    Mary Mendez  FMW:[id number] DOB: 1961-06-26 DOA: 09/10/2024 PCP: [name], MD    Chief Complaint  Patient presents with   Fever    Brief Narrative:  63 year old female with history of left breast cancer, asthma, hyperlipidemia, hypertension, anxiety presented with fever and blood culture growing MSSA.  On presentation, WBC was 4.3, COVID/influenza/RSV PCR negative.  AST of 53 and ALT of 54.  Chest x-ray showed mild central vascular congestion with interstitial edema.  CT L-spine showed mild degenerative changes of the visualized thoracolumbar spine.  CT chest/abdomen/pelvis with contrast showed mild subcutaneous stranding along the right chest port, correlate for cellulitis; new 8 mm right lower lobe pulmonary nodule.  She was started on IV antibiotics.  ID was consulted.  Port-A-Cath was removed by general surgery on 09/14/2024.  Status post TEE 09/16/2024.    Assessment & Plan:   Principal Problem:   Sepsis (HCC) Active Problems:   Malignant neoplasm of upper-inner quadrant of left breast in female, estrogen receptor positive (HCC)   Class 2 obesity   Low back pain   Transaminitis   Essential hypertension   Hyperkalemia   MSSA bacteremia   Mild intermittent asthma   Hypervolemia   Cough  #1 sepsis secondary to MSSA bacteremia/Port-A-Cath infection - Patient on admission met criteria for sepsis with fever, tachypnea, noted now to have MSSA bacteremia and Port-A-Cath site infection. - Status post Port-A-Cath removal per general surgery on 08/14/2024 which noted approximately 20 cc of purulent appearing fluid within the port pocket. - Cultures from port pocket with MSSA. - Initial blood cultures from 09/10/2024 positive for MSSA. - Repeat blood cultures from 09/15/2024 with no growth to date x 2 days. - Repeat cultures from 09/13/2024 negative x 4 days. - TEE done 09/16/2024 negative for vegetations. - MRI L-spine negative for discitis or  osteomyelitis. - The following and recommended continuation of IV cefazolin  for 2 weeks from port removal on 09/14/2024. - PICC line ordered however per PICC team due to right Port-A-Cath infection with removal, left arm restriction due to breast cancer unable to place PICC and recommending IR for tunneled line. -IR consult placed for tunneled central line placement. - ID following and appreciate input and recommendations. - Will need outpatient follow-up with ID on 10/15/2024.  2.  Cough/volume overload -Patient noted with cough of clear sputum.  Daughter feels cough has worsened during this hospitalization. - Chest x-ray done on 09/16/2024 with concerns for mild CHF with mild perihilar and basilar interstitial pulmonary edema and small left pleural effusion, mild cardiomegaly. - 2D echo from 09/13/2024 with a EF of 60 to 65%,NWMA, mild LVH, trivial MVR, no evidence of mitral stenosis, no evidence of aortic valve stenosis. - Patient noted to be +7 L during this hospitalization. - Place on Lasix 40 mg IV every 12 hours x 2 doses. - Follow.  3.  Left breast cancer -Outpatient follow-up with primary oncologist.  4.  8 mm right lower lobe pulmonary nodule -Patient noted to have a 8 mm right lower lobe pulmonary nodule noted on CT chest and recommending follow-up CT chest or PET/CT in 3 months. - Discussed with patient who states this has been there. - Outpatient follow-up with oncology.  5.  Hypertension -Controlled on current regimen of Norvasc 10 mg daily. - Patient also on Lasix 40 mg IV every 12 hours x 2 doses.  6.  Hyperlipidemia -Statin.  7.  Hyponatremia -Resolved.  8.  Hyperkalemia - Resolved.  9.  Hypokalemia - Repleted. - Will give a dose of KDur 40 mEq x 1 as patient being given some IV Lasix .  10.  Mildly elevated LFTs -Resolved.  11.  Thrombocytopenia -No noted signs of bleeding. - Could be secondary to acute infection. - Improved.  12.  Mild intermittent  asthma -Continue loratadine , montelukast , as needed albuterol  nebs. - Patient no longer on Advair.  13.  Obesity class II -Lifestyle modification. - Outpatient follow-up with PCP.   DVT prophylaxis: Lovenox  Code Status: Full Family Communication: Updated patient and daughter at bedside. Disposition: Likely home when clinically improved and cleared by ID hopefully in the next 1 to 2 days.  Status is: Inpatient Remains inpatient appropriate because: Severity of illness   Consultants:  ID: Dr.Manandhar 09/12/2024 General Surgery: Dr.Tsuei 09/14/2024 Cardiology for TEE  Procedures:  CT chest abdomen pelvis 09/10/2024 Chest x-ray 09/10/2024, 09/16/2024. MRI L-spine 09/14/2024 2D echo 09/13/2024 TEE 09/16/2024 Port removal per general surgery: Dr.Tsuei 09/14/2024  Antimicrobials:  Anti-infectives (From admission, onward)    Start     Dose/Rate Route Frequency Ordered Stop   09/11/24 2100  vancomycin  (VANCOREADY) IVPB 1500 mg/300 mL  Status:  Discontinued        1,500 mg 150 mL/hr over 120 Minutes Intravenous Every 24 hours 09/11/24 0807 09/11/24 1352   09/11/24 1445  ceFAZolin  (ANCEF ) IVPB 2g/100 mL premix        2 g 200 mL/hr over 30 Minutes Intravenous Every 8 hours 09/11/24 1352     09/11/24 0800  ceFEPIme  (MAXIPIME ) 2 g in sodium chloride  0.9 % 100 mL IVPB  Status:  Discontinued        2 g 200 mL/hr over 30 Minutes Intravenous Every 8 hours 09/11/24 0754 09/11/24 1352   09/11/24 0800  metroNIDAZOLE  (FLAGYL ) IVPB 500 mg  Status:  Discontinued        500 mg 100 mL/hr over 60 Minutes Intravenous Every 12 hours 09/11/24 0754 09/11/24 1352   09/10/24 2015  vancomycin  (VANCOCIN ) IVPB 1000 mg/200 mL premix       Placed in Followed by Linked Group   1,000 mg 200 mL/hr over 60 Minutes Intravenous  Once 09/10/24 1905 09/10/24 2241   09/10/24 1915  vancomycin  (VANCOCIN ) IVPB 1000 mg/200 mL premix       Placed in Followed by Linked Group   1,000 mg 200 mL/hr over 60 Minutes  Intravenous  Once 09/10/24 1905 09/10/24 2241   09/10/24 1900  ceFEPIme  (MAXIPIME ) 2 g in sodium chloride  0.9 % 100 mL IVPB        2 g 200 mL/hr over 30 Minutes Intravenous  Once 09/10/24 1857 09/10/24 2017   09/10/24 1900  metroNIDAZOLE  (FLAGYL ) IVPB 500 mg        500 mg 100 mL/hr over 60 Minutes Intravenous  Once 09/10/24 1857 09/10/24 2039   09/10/24 1900  vancomycin  (VANCOCIN ) IVPB 1000 mg/200 mL premix  Status:  Discontinued        1,000 mg 200 mL/hr over 60 Minutes Intravenous  Once 09/10/24 1857 09/10/24 1905         Subjective: Patient laying in bed.  Overall feels well.  Denies any chest pain, no shortness of breath.  Denies any abdominal pain.  Overall feels better than she did on admission.  Daughter at bedside.  Patient with cough noted which per daughter has been getting a little bit worse since hospitalization..  Objective: Vitals:   09/17/24 0500 09/17/24 0604 09/17/24 0920 09/17/24 1327  BP:  ROLLEN)  153/86  116/60  Pulse:  77  85  Resp:  18    Temp:  98.2 F (36.8 C)  98.7 F (37.1 C)  TempSrc:    Oral  SpO2:  95% 97% 95%  Weight: 102 kg     Height:        Intake/Output Summary (Last 24 hours) at 09/17/2024 1711 Last data filed at 09/17/2024 1613 Gross per 24 hour  Intake 537 ml  Output 975 ml  Net -438 ml   Filed Weights   09/15/24 0500 09/16/24 0500 09/17/24 0500  Weight: 101.1 kg 102 kg 102 kg    Examination:  General exam: Appears calm and comfortable .  Port-A-Cath site with dressing in place. Respiratory system: Some bibasilar crackles/coarse breath sounds.  No wheezing.  Fair air movement.  Speaking in full sentences.  Cardiovascular system: S1 & S2 heard, RRR. No JVD, murmurs, rubs, gallops or clicks. No pedal edema. Gastrointestinal system: Abdomen is nondistended, soft and nontender. No organomegaly or masses felt. Normal bowel sounds heard. Central nervous system: Alert and oriented. No focal neurological deficits. Extremities: Symmetric 5 x  5 power. Skin: No rashes, lesions or ulcers Psychiatry: Judgement and insight appear normal. Mood & affect appropriate.     Data Reviewed: I have personally reviewed following labs and imaging studies  CBC: Recent Labs  Lab 09/10/24 1855 09/12/24 0604 09/13/24 0052 09/17/24 0830  WBC 4.3 8.2 9.3 12.7*  NEUTROABS 2.4 4.8 6.3 9.4*  HGB 13.2 12.7 11.0* 11.6*  HCT 39.2 40.0 33.3* 34.3*  MCV 84.1 88.7 85.4 84.9  PLT 196 144* 149* 217    Basic Metabolic Panel: Recent Labs  Lab 09/12/24 0604 09/13/24 0052 09/14/24 0535 09/16/24 0505 09/17/24 0830  NA 134* 137 138 143 138  K 3.4* 3.0* 3.4* 3.7 3.8  CL 101 103 102 108 102  CO2 21* 23 26 24 25   GLUCOSE 99 123* 120* 110* 94  BUN 8 7* 8 16 8   CREATININE 0.62 0.56 0.54 0.66 0.61  CALCIUM 8.2* 8.2* 8.1* 8.0* 8.3*  MG  --  2.3 2.2  --  2.4  PHOS  --   --   --   --  3.7    GFR: Estimated Creatinine Clearance: 87.4 mL/min (by C-G formula based on SCr of 0.61 mg/dL).  Liver Function Tests: Recent Labs  Lab 09/10/24 1855 09/12/24 0604 09/13/24 0052 09/17/24 0830  AST 53* 50* 39  --   ALT 54* 50* 35  --   ALKPHOS 109 105 103  --   BILITOT 0.7 0.5 0.4  --   PROT 6.5 6.0* 5.5*  --   ALBUMIN 3.8 3.1* 2.9* 3.1*    CBG: No results for input(s): GLUCAP in the last 168 hours.   Recent Results (from the past 240 hours)  Culture, blood (Routine x 2)     Status: Abnormal   Collection Time: 09/10/24  6:49 PM   Specimen: BLOOD  Result Value Ref Range Status   Specimen Description   Final    BLOOD RIGHT ANTECUBITAL Performed at [facility], [address] [place], [place] [address]    Special Requests   Final    Blood Culture adequate volume BOTTLES DRAWN AEROBIC AND ANAEROBIC Performed at [facility], [address], [place], [place] [address]    Culture  Setup Time   Final    GRAM POSITIVE COCCI IN CLUSTERS IN BOTH AEROBIC AND ANAEROBIC BOTTLES CRITICAL RESULT CALLED TO,  READ BACK BY  AND VERIFIED WITH: PHARMD CRYSTAL ROBERTSON ON 09/11/24 @ 1320 BY DRT Performed at [facility], [address], [place], [place] [address]    Culture STAPHYLOCOCCUS AUREUS (A)  Final   Report Status 09/13/2024 FINAL  Final   Organism ID, Bacteria STAPHYLOCOCCUS AUREUS  Final      Susceptibility   Staphylococcus aureus - MIC*    CIPROFLOXACIN <=0.5 SENSITIVE Sensitive     ERYTHROMYCIN <=0.25 SENSITIVE Sensitive     GENTAMICIN <=0.5 SENSITIVE Sensitive     OXACILLIN <=0.25 SENSITIVE Sensitive     TETRACYCLINE <=1 SENSITIVE Sensitive     VANCOMYCIN <=0.5 SENSITIVE Sensitive     TRIMETH/SULFA <=10 SENSITIVE Sensitive     CLINDAMYCIN <=0.25 SENSITIVE Sensitive     RIFAMPIN <=0.5 SENSITIVE Sensitive     Inducible Clindamycin NEGATIVE Sensitive     LINEZOLID 1 SENSITIVE Sensitive     * STAPHYLOCOCCUS AUREUS  Blood Culture ID Panel (Reflexed)     Status: Abnormal   Collection Time: 09/10/24  6:49 PM  Result Value Ref Range Status   Enterococcus faecalis NOT DETECTED NOT DETECTED Final   Enterococcus Faecium NOT DETECTED NOT DETECTED Final   Listeria monocytogenes NOT DETECTED NOT DETECTED Final   Staphylococcus species DETECTED (A) NOT DETECTED Final    Comment: CRITICAL RESULT CALLED TO, READ BACK BY AND VERIFIED WITH: PHARMD CRYSTAL ROBERTSON ON 09/11/24 @ 1320 BY DRT    Staphylococcus aureus (BCID) DETECTED (A) NOT DETECTED Final    Comment: CRITICAL RESULT CALLED TO, READ BACK BY AND VERIFIED WITH: PHARMD CRYSTAL ROBERTSON ON 09/11/24 @ 1320 BY DRT    Staphylococcus epidermidis NOT DETECTED NOT DETECTED Final   Staphylococcus lugdunensis NOT DETECTED NOT DETECTED Final   Streptococcus species NOT DETECTED NOT DETECTED Final   Streptococcus agalactiae NOT DETECTED NOT DETECTED Final   Streptococcus pneumoniae NOT DETECTED NOT DETECTED Final   Streptococcus pyogenes NOT DETECTED NOT DETECTED Final   A.calcoaceticus-baumannii NOT DETECTED NOT DETECTED Final    Bacteroides fragilis NOT DETECTED NOT DETECTED Final   Enterobacterales NOT DETECTED NOT DETECTED Final   Enterobacter cloacae complex NOT DETECTED NOT DETECTED Final   Escherichia coli NOT DETECTED NOT DETECTED Final   Klebsiella aerogenes NOT DETECTED NOT DETECTED Final   Klebsiella oxytoca NOT DETECTED NOT DETECTED Final   Klebsiella pneumoniae NOT DETECTED NOT DETECTED Final   Proteus species NOT DETECTED NOT DETECTED Final   Salmonella species NOT DETECTED NOT DETECTED Final   Serratia marcescens NOT DETECTED NOT DETECTED Final   Haemophilus influenzae NOT DETECTED NOT DETECTED Final   Neisseria meningitidis NOT DETECTED NOT DETECTED Final   Pseudomonas aeruginosa NOT DETECTED NOT DETECTED Final   Stenotrophomonas maltophilia NOT DETECTED NOT DETECTED Final   Candida albicans NOT DETECTED NOT DETECTED Final   Candida auris NOT DETECTED NOT DETECTED Final   Candida glabrata NOT DETECTED NOT DETECTED Final   Candida krusei NOT DETECTED NOT DETECTED Final   Candida parapsilosis NOT DETECTED NOT DETECTED Final   Candida tropicalis NOT DETECTED NOT DETECTED Final   Cryptococcus neoformans/gattii NOT DETECTED NOT DETECTED Final   Meth resistant mecA/C and MREJ NOT DETECTED NOT DETECTED Final    Comment: Performed at [facility], [address] [place], [place] [address]  Resp panel by RT-PCR (RSV, Flu A&B, Covid) Anterior Nasal Swab     Status: None   Collection Time: 09/10/24  8:36 PM   Specimen: Anterior Nasal Swab  Result Value Ref Range Status   SARS Coronavirus  2 by RT PCR NEGATIVE NEGATIVE Final    Comment: (NOTE) SARS-CoV-2 target nucleic acids are NOT DETECTED.  The SARS-CoV-2 RNA is generally detectable in upper respiratory specimens during the acute phase of infection. The lowest concentration of SARS-CoV-2 viral copies this assay can detect is 138 copies/mL. A negative result does not preclude SARS-Cov-2 infection and should not be used as the sole basis for  treatment or other patient management decisions. A negative result may occur with  improper specimen collection/handling, submission of specimen other than nasopharyngeal swab, presence of viral mutation(s) within the areas targeted by this assay, and inadequate number of viral copies(<138 copies/mL). A negative result must be combined with clinical observations, patient history, and epidemiological information. The expected result is Negative.  Fact Sheet for Patients:  bloggercourse.com  Fact Sheet for Healthcare Providers:  seriousbroker.it  This test is no t yet approved or cleared by the [place]  FDA and  has been authorized for detection and/or diagnosis of SARS-CoV-2 by FDA under an Emergency Use Authorization (EUA). This EUA will remain  in effect (meaning this test can be used) for the duration of the COVID-19 declaration under Section 564(b)(1) of the Act, 21 U.S.C.section 360bbb-3(b)(1), unless the authorization is terminated  or revoked sooner.       Influenza A by PCR NEGATIVE NEGATIVE Final   Influenza B by PCR NEGATIVE NEGATIVE Final    Comment: (NOTE) The Xpert Xpress SARS-CoV-2/FLU/RSV plus assay is intended as an aid in the diagnosis of influenza from Nasopharyngeal swab specimens and should not be used as a sole basis for treatment. Nasal washings and aspirates are unacceptable for Xpert Xpress SARS-CoV-2/FLU/RSV testing.  Fact Sheet for Patients: bloggercourse.com  Fact Sheet for Healthcare Providers: seriousbroker.it  This test is not yet approved or cleared by the [place]  FDA and has been authorized for detection and/or diagnosis of SARS-CoV-2 by FDA under an Emergency Use Authorization (EUA). This EUA will remain in effect (meaning this test can be used) for the duration of the COVID-19 declaration under Section 564(b)(1) of the Act, 21  U.S.C. section 360bbb-3(b)(1), unless the authorization is terminated or revoked.     Resp Syncytial Virus by PCR NEGATIVE NEGATIVE Final    Comment: (NOTE) Fact Sheet for Patients: bloggercourse.com  Fact Sheet for Healthcare Providers: seriousbroker.it  This test is not yet approved or cleared by the [place]  FDA and has been authorized for detection and/or diagnosis of SARS-CoV-2 by FDA under an Emergency Use Authorization (EUA). This EUA will remain in effect (meaning this test can be used) for the duration of the COVID-19 declaration under Section 564(b)(1) of the Act, 21 U.S.C. section 360bbb-3(b)(1), unless the authorization is terminated or revoked.  Performed at [facility], [address] [place], [place] [address]   Culture, blood (Routine X 2) w Reflex to ID Panel     Status: None (Preliminary result)   Collection Time: 09/13/24 12:52 AM   Specimen: BLOOD RIGHT ARM  Result Value Ref Range Status   Specimen Description   Final    BLOOD RIGHT ARM Performed at [facility], [address] [place], [place] [address]    Special Requests   Final    BOTTLES DRAWN AEROBIC ONLY Blood Culture adequate volume Performed at [facility], [address] [place], [place] [address]    Culture   Final    NO GROWTH 4 DAYS Performed at [facility], [address], [place], [place]  [address]    Report Status PENDING  Incomplete  Culture, blood (Routine X 2) w Reflex to ID Panel     Status: None (Preliminary result)   Collection Time: 09/13/24 12:52 AM   Specimen: BLOOD RIGHT ARM  Result Value Ref Range Status   Specimen Description   Final    BLOOD RIGHT ARM Performed at [facility], [address] [place], [place] [address]    Special Requests   Final    BOTTLES DRAWN AEROBIC ONLY Blood Culture adequate volume Performed at [facility],  [address], [place], [place] [address]    Culture   Final    NO GROWTH 4 DAYS Performed at [facility], [address], [place], [place] [address]    Report Status PENDING  Incomplete  Surgical PCR screen     Status: None   Collection Time: 09/14/24  1:35 AM   Specimen: Nasal Mucosa; Nasal Swab  Result Value Ref Range Status   MRSA, PCR NEGATIVE NEGATIVE Final   Staphylococcus aureus NEGATIVE NEGATIVE Final    Comment: (NOTE) The Xpert SA Assay (FDA approved for NASAL specimens in patients 72 years of age and older), is one component of a comprehensive surveillance program. It is not intended to diagnose infection nor to guide or monitor treatment. Performed at [facility], [address] [place], [place] [address]   Aerobic/Anaerobic Culture w Gram Stain (surgical/deep wound)     Status: None (Preliminary result)   Collection Time: 09/14/24  9:20 AM   Specimen: Path fluid; Body Fluid  Result Value Ref Range Status   Specimen Description   Final    FLUID Performed at [facility], [address], [place], [place] [address]    Special Requests   Final    NONE Performed at [facility], [address], [place], [place] [address]    Gram Stain   Final    NO WBC SEEN RARE GRAM POSITIVE COCCI IN PAIRS Performed at [facility], [address], [place], [place] [address]    Culture   Final    RARE STAPHYLOCOCCUS AUREUS NO ANAEROBES ISOLATED; CULTURE IN PROGRESS FOR 5 DAYS    Report Status PENDING  Incomplete   Organism ID, Bacteria STAPHYLOCOCCUS AUREUS  Final      Susceptibility   Staphylococcus aureus - MIC*    CIPROFLOXACIN <=0.5 SENSITIVE Sensitive     ERYTHROMYCIN <=0.25 SENSITIVE Sensitive     GENTAMICIN <=0.5 SENSITIVE Sensitive     OXACILLIN <=0.25 SENSITIVE Sensitive     TETRACYCLINE <=1 SENSITIVE Sensitive     VANCOMYCIN  <=0.5 SENSITIVE Sensitive     TRIMETH/SULFA <=10 SENSITIVE  Sensitive     CLINDAMYCIN <=0.25 SENSITIVE Sensitive     RIFAMPIN <=0.5 SENSITIVE Sensitive     Inducible Clindamycin NEGATIVE Sensitive     LINEZOLID 1 SENSITIVE Sensitive     * RARE STAPHYLOCOCCUS AUREUS  Culture, blood (Routine X 2) w Reflex to ID Panel     Status: None (Preliminary result)   Collection Time: 09/15/24 10:55 AM   Specimen: BLOOD  Result Value Ref Range Status   Specimen Description   Final    BLOOD BLOOD RIGHT HAND Performed at [facility], [address] [place], [place] [address]    Special Requests   Final    BOTTLES DRAWN AEROBIC ONLY Blood Culture results may not be optimal due to an inadequate volume of  blood received in culture bottles Performed at [facility], [address] [place], [place] [address]    Culture   Final    NO GROWTH 2 DAYS Performed at [facility], [address], [place], [place] [address]    Report Status PENDING  Incomplete  Culture, blood (Routine X 2) w Reflex to ID Panel     Status: None (Preliminary result)   Collection Time: 09/15/24 11:01 AM   Specimen: BLOOD  Result Value Ref Range Status   Specimen Description   Final    BLOOD BLOOD RIGHT HAND Performed at [facility], [address], [place], [place] [address]    Special Requests   Final    BOTTLES DRAWN AEROBIC ONLY Blood Culture results may not be optimal due to an inadequate volume of blood received in culture bottles Performed at [facility], [address], [place], [place] [address]    Culture   Final    NO GROWTH 2 DAYS Performed at [facility], [address] [place], [place] [address]    Report Status PENDING  Incomplete         Radiology Studies: US  EKG SITE RITE Result Date: 09/17/2024 If Site Rite image not attached, placement could not be confirmed due to current cardiac rhythm.  DG CHEST PORT 1 VIEW Result Date: 09/16/2024 EXAM: 1 VIEW(S) XRAY OF THE  CHEST 09/16/2024 03:03:00 PM COMPARISON: None available. CLINICAL HISTORY: Dyspnea FINDINGS: LUNGS AND PLEURA: Mild perihilar and basilar interstitial pulmonary edema. Small left pleural effusion. No pneumothorax. HEART AND MEDIASTINUM: Mild cardiomegaly. BONES AND SOFT TISSUES: Left breast surgical clips. Osseous structures are age appropriate. IMPRESSION: 1. Mild CHF with mild perihilar and basilar interstitial pulmonary edema and small left pleural effusion. 2. Mild cardiomegaly. Electronically signed by: Dorethia Molt MD 09/16/2024 10:38 PM EST RP Workstation: [id number]   ECHO TEE Result Date: 09/16/2024    TRANSESOPHOGEAL ECHO REPORT   Patient Name:   [facility] A Pinela Date of Exam: 09/16/2024 Medical Rec #:  [id number]       Height:       65.0 in Accession #:    [id number]      Weight:       224.9 lb Date of Birth:  1961-10-28        BSA:          2.079 m Patient Age:    63 years        BP:           121/74 mmHg Patient Gender: F               HR:           78 bpm. Exam Location:  Inpatient Procedure: Transesophageal Echo, Cardiac Doppler and Color Doppler (Both            Spectral and Color Flow Doppler were utilized during procedure). Indications:     Endocarditis  History:         Patient has prior history of Echocardiogram examinations, most                  recent 09/13/2024. Previous Myocardial Infarction, Pacemaker                  and Defibrillator, hx of cancer and Stroke,                  Signs/Symptoms:Bacteremia; Risk Factors:Hypertension,  Dyslipidemia, Sleep Apnea and Diabetes.  Sonographer:     Koleen Popper RDCS Referring Phys:  [id number] ZANE ADAMS Diagnosing Phys: Wilbert Bihari MD PROCEDURE: After discussion of the risks and benefits of a TEE, an informed consent was obtained from the patient. TEE procedure time was 6 minutes. The transesophogeal probe was passed without difficulty through the esophogus of the patient. Imaged were  obtained with the patient in a left lateral  decubitus position. Sedation performed by different physician. The patient was monitored while under deep sedation. Anesthestetic sedation was provided intravenously by Anesthesiology: 162mg  of Propofol . Image  quality was good. The patient's vital signs; including heart rate, blood pressure, and oxygen saturation; remained stable throughout the procedure. The patient developed no complications during the procedure.  IMPRESSIONS  1. Left ventricular ejection fraction, by estimation, is 60 to 65%. The left ventricle has normal function. The left ventricle has no regional wall motion abnormalities.  2. Right ventricular systolic function is normal. The right ventricular size is normal.  3. No left atrial/left atrial appendage thrombus was detected.  4. The mitral valve is normal in structure. Mild to moderate mitral valve regurgitation. No evidence of mitral stenosis.  5. The aortic valve is normal in structure. Aortic valve regurgitation is not visualized. No aortic stenosis is present.  6. The inferior vena cava is normal in size with greater than 50% respiratory variability, suggesting right atrial pressure of 3 mmHg. Conclusion(s)/Recommendation(s): Normal biventricular function without evidence of hemodynamically significant valvular heart disease. No evidence of vegetation/infective endocarditis on this transesophageael echocardiogram. FINDINGS  Left Ventricle: Left ventricular ejection fraction, by estimation, is 60 to 65%. The left ventricle has normal function. The left ventricle has no regional wall motion abnormalities. The left ventricular internal cavity size was normal in size. There is  no left ventricular hypertrophy. Right Ventricle: The right ventricular size is normal. No increase in right ventricular wall thickness. Right ventricular systolic function is normal. Left Atrium: Left atrial size was normal in size. No left atrial/left atrial appendage thrombus was detected. Right Atrium: Right atrial  size was normal in size. Pericardium: There is no evidence of pericardial effusion. Mitral Valve: The mitral valve is normal in structure. Mild to moderate mitral valve regurgitation. No evidence of mitral valve stenosis. Tricuspid Valve: The tricuspid valve is normal in structure. Tricuspid valve regurgitation is mild . No evidence of tricuspid stenosis. Aortic Valve: The aortic valve is normal in structure. Aortic valve regurgitation is not visualized. No aortic stenosis is present. Pulmonic Valve: The pulmonic valve was normal in structure. Pulmonic valve regurgitation is trivial. No evidence of pulmonic stenosis. Aorta: The aortic root is normal in size and structure. Venous: The inferior vena cava is normal in size with greater than 50% respiratory variability, suggesting right atrial pressure of 3 mmHg. IAS/Shunts: The interatrial septum appears to be lipomatous. No atrial level shunt detected by color flow Doppler. Additional Comments: Spectral Doppler performed. LEFT VENTRICLE PLAX 2D LVOT diam:     2.10 cm LVOT Area:     3.46 cm   AORTA Ao Root diam: 3.00 cm Ao Asc diam:  3.20 cm  SHUNTS Systemic Diam: 2.10 cm Wilbert Bihari MD Electronically signed by Wilbert Bihari MD Signature Date/Time: 09/16/2024/7:33:29 PM    Final (Updated)    EP STUDY Result Date: 09/16/2024 See surgical note for result.       Scheduled Meds:  amLODipine  10 mg Oral Daily   enoxaparin (LOVENOX) injection  50 mg Subcutaneous Q24H  fluticasone furoate-vilanterol  1 puff Inhalation Daily   furosemide  40 mg Intravenous Q12H   loratadine  10 mg Oral Daily   montelukast  10 mg Oral QHS   mupirocin ointment  1 Application Nasal BID   rosuvastatin  10 mg Oral Daily   Continuous Infusions:   ceFAZolin  (ANCEF ) IV 2 g (09/17/24 1430)     LOS: 6 days    Time spent: 40 minutes    Toribio Hummer, MD [facility]   To contact the attending provider between 7A-7P or the covering provider during after hours  7P-7A, please log into the web site [email or web address] and access using universal [place] password for that web site. If you do not have the password, please call the hospital operator.  09/17/2024, 5:11 PM

## 2024-09-18 ENCOUNTER — Inpatient Hospital Stay (HOSPITAL_COMMUNITY)

## 2024-09-18 DIAGNOSIS — R7881 Bacteremia: Secondary | ICD-10-CM | POA: Diagnosis not present

## 2024-09-18 DIAGNOSIS — C50212 Malignant neoplasm of upper-inner quadrant of left female breast: Secondary | ICD-10-CM | POA: Diagnosis not present

## 2024-09-18 DIAGNOSIS — B9561 Methicillin susceptible Staphylococcus aureus infection as the cause of diseases classified elsewhere: Secondary | ICD-10-CM | POA: Diagnosis not present

## 2024-09-18 DIAGNOSIS — E66812 Obesity, class 2: Secondary | ICD-10-CM | POA: Diagnosis not present

## 2024-09-18 DIAGNOSIS — E875 Hyperkalemia: Secondary | ICD-10-CM | POA: Diagnosis not present

## 2024-09-18 LAB — CBC WITH DIFFERENTIAL/PLATELET
Abs Immature Granulocytes: 0.62 K/uL — ABNORMAL HIGH (ref 0.00–0.07)
Basophils Absolute: 0.1 K/uL (ref 0.0–0.1)
Basophils Relative: 1 %
Eosinophils Absolute: 0 K/uL (ref 0.0–0.5)
Eosinophils Relative: 0 %
HCT: 34.4 % — ABNORMAL LOW (ref 36.0–46.0)
Hemoglobin: 11.3 g/dL — ABNORMAL LOW (ref 12.0–15.0)
Immature Granulocytes: 7 %
Lymphocytes Relative: 16 %
Lymphs Abs: 1.5 K/uL (ref 0.7–4.0)
MCH: 28.1 pg (ref 26.0–34.0)
MCHC: 32.8 g/dL (ref 30.0–36.0)
MCV: 85.6 fL (ref 80.0–100.0)
Monocytes Absolute: 0.4 K/uL (ref 0.1–1.0)
Monocytes Relative: 5 %
Neutro Abs: 6.7 K/uL (ref 1.7–7.7)
Neutrophils Relative %: 71 %
Platelets: 230 K/uL (ref 150–400)
RBC: 4.02 MIL/uL (ref 3.87–5.11)
RDW: 15.4 % (ref 11.5–15.5)
Smear Review: NORMAL
WBC: 9.3 K/uL (ref 4.0–10.5)
nRBC: 0 % (ref 0.0–0.2)

## 2024-09-18 LAB — CULTURE, BLOOD (ROUTINE X 2)
Culture: NO GROWTH
Culture: NO GROWTH
Special Requests: ADEQUATE
Special Requests: ADEQUATE

## 2024-09-18 LAB — BASIC METABOLIC PANEL WITH GFR
Anion gap: 10 (ref 5–15)
BUN: 12 mg/dL (ref 8–23)
CO2: 25 mmol/L (ref 22–32)
Calcium: 8.2 mg/dL — ABNORMAL LOW (ref 8.9–10.3)
Chloride: 103 mmol/L (ref 98–111)
Creatinine, Ser: 0.76 mg/dL (ref 0.44–1.00)
GFR, Estimated: >60 mL/min (ref >60)
Glucose, Bld: 124 mg/dL — ABNORMAL HIGH (ref 70–99)
Potassium: 4.1 mmol/L (ref 3.5–5.1)
Sodium: 138 mmol/L (ref 135–145)

## 2024-09-18 MED ORDER — SODIUM CHLORIDE 0.9% FLUSH: 10.0000 mL | Freq: Two times a day (BID) | INTRAVENOUS | Status: DC

## 2024-09-18 MED ORDER — CHLORHEXIDINE GLUCONATE CLOTH 2 % EX PADS: 6.0000 | MEDICATED_PAD | Freq: Every day | CUTANEOUS | Status: DC

## 2024-09-18 MED ORDER — CEFAZOLIN IV (FOR PTA / DISCHARGE USE ONLY): 2.0000 g | Freq: Three times a day (TID) | INTRAVENOUS | 0 refills | Status: AC

## 2024-09-18 MED ORDER — LIDOCAINE HCL 1 % IJ SOLN: 20.0000 mL | Freq: Once | INTRAMUSCULAR | Status: DC

## 2024-09-18 MED ORDER — ZOLPIDEM TARTRATE 5 MG PO TABS: 5.0000 mg | ORAL_TABLET | Freq: Every evening | ORAL | 0 refills | Status: AC | PRN

## 2024-09-18 MED ORDER — SODIUM CHLORIDE 0.9% FLUSH: 10.0000 mL | INTRAVENOUS | Status: DC | PRN

## 2024-09-18 MED ORDER — HEPARIN SOD (PORK) LOCK FLUSH 100 UNIT/ML IV SOLN
INTRAVENOUS | Status: AC
  Filled 2024-09-18: qty 5

## 2024-09-18 MED ORDER — LIDOCAINE HCL 1 % IJ SOLN
INTRAMUSCULAR | Status: AC
Start: 2024-09-18 — End: 2024-09-18
  Filled 2024-09-18: qty 20

## 2024-09-18 MED ORDER — GUAIFENESIN-DM 100-10 MG/5ML PO SYRP: 5.0000 mL | ORAL_SOLUTION | ORAL | Status: AC | PRN

## 2024-09-18 NOTE — Progress Notes (Incomplete)
 PROGRESS NOTE    Mary Mendez  FMW:[id number] DOB: 07/12/1961 DOA: 09/10/2024 PCP: [name], MD    Chief Complaint  Patient presents with   Fever    Brief Narrative:  63 year old female with history of left breast cancer, asthma, hyperlipidemia, hypertension, anxiety presented with fever and blood culture growing MSSA.  On presentation, WBC was 4.3, COVID/influenza/RSV PCR negative.  AST of 53 and ALT of 54.  Chest x-ray showed mild central vascular congestion with interstitial edema.  CT L-spine showed mild degenerative changes of the visualized thoracolumbar spine.  CT chest/abdomen/pelvis with contrast showed mild subcutaneous stranding along the right chest port, correlate for cellulitis; new 8 mm right lower lobe pulmonary nodule.  She was started on IV antibiotics.  ID was consulted.  Port-A-Cath was removed by general surgery on 09/14/2024.  Status post TEE 09/16/2024.    Assessment & Plan:   Principal Problem:   Sepsis (HCC) Active Problems:   Malignant neoplasm of upper-inner quadrant of left breast in female, estrogen receptor positive (HCC)   Class 2 obesity   Low back pain   Transaminitis   Essential hypertension   Hyperkalemia   MSSA bacteremia   Mild intermittent asthma   Hypervolemia   Cough  #1 sepsis secondary to MSSA bacteremia/Port-A-Cath infection - Patient on admission met criteria for sepsis with fever, tachypnea, noted now to have MSSA bacteremia and Port-A-Cath site infection. - Status post Port-A-Cath removal per general surgery on 08/14/2024 which noted approximately 20 cc of purulent appearing fluid within the port pocket. - Cultures from port pocket with MSSA. - Initial blood cultures from 09/10/2024 positive for MSSA. - Repeat blood cultures from 09/15/2024 with no growth to date x 2 days. - Repeat cultures from 09/13/2024 negative x 4 days. - TEE done 09/16/2024 negative for vegetations. - MRI L-spine negative for discitis or  osteomyelitis. - The following and recommended continuation of IV cefazolin  for 2 weeks from port removal on 09/14/2024. - PICC line ordered however per PICC team due to right Port-A-Cath infection with removal, left arm restriction due to breast cancer unable to place PICC and recommending IR for tunneled line. -IR consult placed for tunneled central line placement. - ID following and appreciate input and recommendations. - Will need outpatient follow-up with ID on 10/15/2024.  2.  Cough/volume overload -Patient noted with cough of clear sputum.  Daughter feels cough has worsened during this hospitalization. - Chest x-ray done on 09/16/2024 with concerns for mild CHF with mild perihilar and basilar interstitial pulmonary edema and small left pleural effusion, mild cardiomegaly. - 2D echo from 09/13/2024 with a EF of 60 to 65%,NWMA, mild LVH, trivial MVR, no evidence of mitral stenosis, no evidence of aortic valve stenosis. - Patient noted to be +7 L during this hospitalization. - Place on Lasix 40 mg IV every 12 hours x 2 doses. - Follow.  3.  Left breast cancer -Outpatient follow-up with primary oncologist.  4.  8 mm right lower lobe pulmonary nodule -Patient noted to have a 8 mm right lower lobe pulmonary nodule noted on CT chest and recommending follow-up CT chest or PET/CT in 3 months. - Discussed with patient who states this has been there. - Outpatient follow-up with oncology.  5.  Hypertension -Controlled on current regimen of Norvasc 10 mg daily. - Patient also on Lasix 40 mg IV every 12 hours x 2 doses.  6.  Hyperlipidemia -Statin.  7.  Hyponatremia -Resolved.  8.  Hyperkalemia - Resolved.  9.  Hypokalemia - Repleted. - Will give a dose of KDur 40 mEq x 1 as patient being given some IV Lasix.  10.  Mildly elevated LFTs -Resolved.  11.  Thrombocytopenia -No noted signs of bleeding. - Could be secondary to acute infection. - Improved.  12.  Mild intermittent  asthma -Continue loratadine, montelukast, as needed albuterol  nebs. - Patient no longer on Advair.  13.  Obesity class II -Lifestyle modification. - Outpatient follow-up with PCP.   DVT prophylaxis: Lovenox Code Status: Full Family Communication: Updated patient and daughter at bedside. Disposition: Likely home when clinically improved and cleared by ID hopefully in the next 1 to 2 days.  Status is: Inpatient Remains inpatient appropriate because: Severity of illness   Consultants:  ID: Dr.Manandhar 09/12/2024 General Surgery: Dr.Tsuei 09/14/2024 Cardiology for TEE  Procedures:  CT chest abdomen pelvis 09/10/2024 Chest x-ray 09/10/2024, 09/16/2024. MRI L-spine 09/14/2024 2D echo 09/13/2024 TEE 09/16/2024 Port removal per general surgery: Dr.Tsuei 09/14/2024  Antimicrobials:  Anti-infectives (From admission, onward)    Start     Dose/Rate Route Frequency Ordered Stop   09/18/24 0000  ceFAZolin  (ANCEF ) IVPB        2 g Intravenous Every 8 hours 09/18/24 1057 09/28/24 2359   09/11/24 2100  vancomycin (VANCOREADY) IVPB 1500 mg/300 mL  Status:  Discontinued        1,500 mg 150 mL/hr over 120 Minutes Intravenous Every 24 hours 09/11/24 0807 09/11/24 1352   09/11/24 1445  ceFAZolin  (ANCEF ) IVPB 2g/100 mL premix        2 g 200 mL/hr over 30 Minutes Intravenous Every 8 hours 09/11/24 1352     09/11/24 0800  ceFEPIme (MAXIPIME) 2 g in sodium chloride  0.9 % 100 mL IVPB  Status:  Discontinued        2 g 200 mL/hr over 30 Minutes Intravenous Every 8 hours 09/11/24 0754 09/11/24 1352   09/11/24 0800  metroNIDAZOLE (FLAGYL) IVPB 500 mg  Status:  Discontinued        500 mg 100 mL/hr over 60 Minutes Intravenous Every 12 hours 09/11/24 0754 09/11/24 1352   09/10/24 2015  vancomycin (VANCOCIN) IVPB 1000 mg/200 mL premix       Placed in Followed by Linked Group   1,000 mg 200 mL/hr over 60 Minutes Intravenous  Once 09/10/24 1905 09/10/24 2241   09/10/24 1915  vancomycin (VANCOCIN)  IVPB 1000 mg/200 mL premix       Placed in Followed by Linked Group   1,000 mg 200 mL/hr over 60 Minutes Intravenous  Once 09/10/24 1905 09/10/24 2241   09/10/24 1900  ceFEPIme (MAXIPIME) 2 g in sodium chloride  0.9 % 100 mL IVPB        2 g 200 mL/hr over 30 Minutes Intravenous  Once 09/10/24 1857 09/10/24 2017   09/10/24 1900  metroNIDAZOLE (FLAGYL) IVPB 500 mg        500 mg 100 mL/hr over 60 Minutes Intravenous  Once 09/10/24 1857 09/10/24 2039   09/10/24 1900  vancomycin (VANCOCIN) IVPB 1000 mg/200 mL premix  Status:  Discontinued        1,000 mg 200 mL/hr over 60 Minutes Intravenous  Once 09/10/24 1857 09/10/24 1905         Subjective: Patient laying in bed.  Overall feels well.  Denies any chest pain, no shortness of breath.  Denies any abdominal pain.  Overall feels better than she did on admission.  Daughter at bedside.  Patient with cough noted which per daughter  has been getting a little bit worse since hospitalization..  Objective: Vitals:   09/17/24 2128 09/18/24 0500 09/18/24 0610 09/18/24 0840  BP: 137/77  122/67   Pulse: 90  72   Resp: 20  20   Temp: 99.3 F (37.4 C)  98.5 F (36.9 C)   TempSrc: Oral  Oral   SpO2: 94%  95% 96%  Weight:  97.8 kg    Height:        Intake/Output Summary (Last 24 hours) at 09/18/2024 1116 Last data filed at 09/17/2024 2300 Gross per 24 hour  Intake 660 ml  Output 1695 ml  Net -1035 ml   Filed Weights   09/16/24 0500 09/17/24 0500 09/18/24 0500  Weight: 102 kg 102 kg 97.8 kg    Examination:  General exam: Appears calm and comfortable .  Port-A-Cath site with dressing in place. Respiratory system: Some bibasilar crackles/coarse breath sounds.  No wheezing.  Fair air movement.  Speaking in full sentences.  Cardiovascular system: S1 & S2 heard, RRR. No JVD, murmurs, rubs, gallops or clicks. No pedal edema. Gastrointestinal system: Abdomen is nondistended, soft and nontender. No organomegaly or masses felt. Normal bowel  sounds heard. Central nervous system: Alert and oriented. No focal neurological deficits. Extremities: Symmetric 5 x 5 power. Skin: No rashes, lesions or ulcers Psychiatry: Judgement and insight appear normal. Mood & affect appropriate.     Data Reviewed: I have personally reviewed following labs and imaging studies  CBC: Recent Labs  Lab 09/12/24 0604 09/13/24 0052 09/17/24 0830 09/18/24 0559  WBC 8.2 9.3 12.7* 9.3  NEUTROABS 4.8 6.3 9.4* 6.7  HGB 12.7 11.0* 11.6* 11.3*  HCT 40.0 33.3* 34.3* 34.4*  MCV 88.7 85.4 84.9 85.6  PLT 144* 149* 217 230    Basic Metabolic Panel: Recent Labs  Lab 09/13/24 0052 09/14/24 0535 09/16/24 0505 09/17/24 0830 09/18/24 0559  NA 137 138 143 138 138  K 3.0* 3.4* 3.7 3.8 4.1  CL 103 102 108 102 103  CO2 23 26 24 25 25   GLUCOSE 123* 120* 110* 94 124*  BUN 7* 8 16 8 12   CREATININE 0.56 0.54 0.66 0.61 0.76  CALCIUM  8.2* 8.1* 8.0* 8.3* 8.2*  MG 2.3 2.2  --  2.4  --   PHOS  --   --   --  3.7  --     GFR: Estimated Creatinine Clearance: 85.5 mL/min (by C-G formula based on SCr of 0.76 mg/dL).  Liver Function Tests: Recent Labs  Lab 09/12/24 0604 09/13/24 0052 09/17/24 0830  AST 50* 39  --   ALT 50* 35  --   ALKPHOS 105 103  --   BILITOT 0.5 0.4  --   PROT 6.0* 5.5*  --   ALBUMIN 3.1* 2.9* 3.1*    CBG: No results for input(s): GLUCAP in the last 168 hours.   Recent Results (from the past 240 hours)  Culture, blood (Routine x 2)     Status: Abnormal   Collection Time: 09/10/24  6:49 PM   Specimen: BLOOD  Result Value Ref Range Status   Specimen Description   Final    BLOOD RIGHT ANTECUBITAL Performed at [facility], [address], [place], [place] [address]    Special Requests   Final    Blood Culture adequate volume BOTTLES DRAWN AEROBIC AND ANAEROBIC Performed at [facility], [address] [place], [place] [address]    Culture  Setup Time   Final    GRAM POSITIVE  COCCI IN CLUSTERS IN BOTH AEROBIC AND ANAEROBIC BOTTLES CRITICAL RESULT CALLED TO, READ BACK BY AND VERIFIED WITH: PHARMD CRYSTAL ROBERTSON ON 09/11/24 @ 1320 BY DRT Performed at [facility], [address] [place], [place] [address]    Culture STAPHYLOCOCCUS AUREUS (A)  Final   Report Status 09/13/2024 FINAL  Final   Organism ID, Bacteria STAPHYLOCOCCUS AUREUS  Final      Susceptibility   Staphylococcus aureus - MIC*    CIPROFLOXACIN <=0.5 SENSITIVE Sensitive     ERYTHROMYCIN <=0.25 SENSITIVE Sensitive     GENTAMICIN <=0.5 SENSITIVE Sensitive     OXACILLIN <=0.25 SENSITIVE Sensitive     TETRACYCLINE <=1 SENSITIVE Sensitive     VANCOMYCIN <=0.5 SENSITIVE Sensitive     TRIMETH/SULFA <=10 SENSITIVE Sensitive     CLINDAMYCIN <=0.25 SENSITIVE Sensitive     RIFAMPIN <=0.5 SENSITIVE Sensitive     Inducible Clindamycin NEGATIVE Sensitive     LINEZOLID 1 SENSITIVE Sensitive     * STAPHYLOCOCCUS AUREUS  Blood Culture ID Panel (Reflexed)     Status: Abnormal   Collection Time: 09/10/24  6:49 PM  Result Value Ref Range Status   Enterococcus faecalis NOT DETECTED NOT DETECTED Final   Enterococcus Faecium NOT DETECTED NOT DETECTED Final   Listeria monocytogenes NOT DETECTED NOT DETECTED Final   Staphylococcus species DETECTED (A) NOT DETECTED Final    Comment: CRITICAL RESULT CALLED TO, READ BACK BY AND VERIFIED WITH: PHARMD CRYSTAL ROBERTSON ON 09/11/24 @ 1320 BY DRT    Staphylococcus aureus (BCID) DETECTED (A) NOT DETECTED Final    Comment: CRITICAL RESULT CALLED TO, READ BACK BY AND VERIFIED WITH: PHARMD CRYSTAL ROBERTSON ON 09/11/24 @ 1320 BY DRT    Staphylococcus epidermidis NOT DETECTED NOT DETECTED Final   Staphylococcus lugdunensis NOT DETECTED NOT DETECTED Final   Streptococcus species NOT DETECTED NOT DETECTED Final   Streptococcus agalactiae NOT DETECTED NOT DETECTED Final   Streptococcus pneumoniae NOT DETECTED NOT DETECTED Final   Streptococcus pyogenes NOT DETECTED  NOT DETECTED Final   A.calcoaceticus-baumannii NOT DETECTED NOT DETECTED Final   Bacteroides fragilis NOT DETECTED NOT DETECTED Final   Enterobacterales NOT DETECTED NOT DETECTED Final   Enterobacter cloacae complex NOT DETECTED NOT DETECTED Final   Escherichia coli NOT DETECTED NOT DETECTED Final   Klebsiella aerogenes NOT DETECTED NOT DETECTED Final   Klebsiella oxytoca NOT DETECTED NOT DETECTED Final   Klebsiella pneumoniae NOT DETECTED NOT DETECTED Final   Proteus species NOT DETECTED NOT DETECTED Final   Salmonella species NOT DETECTED NOT DETECTED Final   Serratia marcescens NOT DETECTED NOT DETECTED Final   Haemophilus influenzae NOT DETECTED NOT DETECTED Final   Neisseria meningitidis NOT DETECTED NOT DETECTED Final   Pseudomonas aeruginosa NOT DETECTED NOT DETECTED Final   Stenotrophomonas maltophilia NOT DETECTED NOT DETECTED Final   Candida albicans NOT DETECTED NOT DETECTED Final   Candida auris NOT DETECTED NOT DETECTED Final   Candida glabrata NOT DETECTED NOT DETECTED Final   Candida krusei NOT DETECTED NOT DETECTED Final   Candida parapsilosis NOT DETECTED NOT DETECTED Final   Candida tropicalis NOT DETECTED NOT DETECTED Final   Cryptococcus neoformans/gattii NOT DETECTED NOT DETECTED Final   Meth resistant mecA/C and MREJ NOT DETECTED NOT DETECTED Final    Comment: Performed at [facility], [address], [place], [place] [address]  Resp panel by RT-PCR (RSV, Flu A&B, Covid) Anterior Nasal Swab     Status: None   Collection Time: 09/10/24  8:36 PM  Specimen: Anterior Nasal Swab  Result Value Ref Range Status   SARS Coronavirus 2 by RT PCR NEGATIVE NEGATIVE Final    Comment: (NOTE) SARS-CoV-2 target nucleic acids are NOT DETECTED.  The SARS-CoV-2 RNA is generally detectable in upper respiratory specimens during the acute phase of infection. The lowest concentration of SARS-CoV-2 viral copies this assay can detect is 138 copies/mL. A negative result does  not preclude SARS-Cov-2 infection and should not be used as the sole basis for treatment or other patient management decisions. A negative result may occur with  improper specimen collection/handling, submission of specimen other than nasopharyngeal swab, presence of viral mutation(s) within the areas targeted by this assay, and inadequate number of viral copies(<138 copies/mL). A negative result must be combined with clinical observations, patient history, and epidemiological information. The expected result is Negative.  Fact Sheet for Patients:  bloggercourse.com  Fact Sheet for Healthcare Providers:  seriousbroker.it  This test is no t yet approved or cleared by the [place]  FDA and  has been authorized for detection and/or diagnosis of SARS-CoV-2 by FDA under an Emergency Use Authorization (EUA). This EUA will remain  in effect (meaning this test can be used) for the duration of the COVID-19 declaration under Section 564(b)(1) of the Act, 21 U.S.C.section 360bbb-3(b)(1), unless the authorization is terminated  or revoked sooner.       Influenza A by PCR NEGATIVE NEGATIVE Final   Influenza B by PCR NEGATIVE NEGATIVE Final    Comment: (NOTE) The Xpert Xpress SARS-CoV-2/FLU/RSV plus assay is intended as an aid in the diagnosis of influenza from Nasopharyngeal swab specimens and should not be used as a sole basis for treatment. Nasal washings and aspirates are unacceptable for Xpert Xpress SARS-CoV-2/FLU/RSV testing.  Fact Sheet for Patients: bloggercourse.com  Fact Sheet for Healthcare Providers: seriousbroker.it  This test is not yet approved or cleared by the [place]  FDA and has been authorized for detection and/or diagnosis of SARS-CoV-2 by FDA under an Emergency Use Authorization (EUA). This EUA will remain in effect (meaning this test can be used) for the  duration of the COVID-19 declaration under Section 564(b)(1) of the Act, 21 U.S.C. section 360bbb-3(b)(1), unless the authorization is terminated or revoked.     Resp Syncytial Virus by PCR NEGATIVE NEGATIVE Final    Comment: (NOTE) Fact Sheet for Patients: bloggercourse.com  Fact Sheet for Healthcare Providers: seriousbroker.it  This test is not yet approved or cleared by the [place]  FDA and has been authorized for detection and/or diagnosis of SARS-CoV-2 by FDA under an Emergency Use Authorization (EUA). This EUA will remain in effect (meaning this test can be used) for the duration of the COVID-19 declaration under Section 564(b)(1) of the Act, 21 U.S.C. section 360bbb-3(b)(1), unless the authorization is terminated or revoked.  Performed at [facility], [address] [place], [place] [address]   Culture, blood (Routine X 2) w Reflex to ID Panel     Status: None   Collection Time: 09/13/24 12:52 AM   Specimen: BLOOD RIGHT ARM  Result Value Ref Range Status   Specimen Description   Final    BLOOD RIGHT ARM Performed at [facility], [address], [place], [place] [address]    Special Requests   Final    BOTTLES DRAWN AEROBIC ONLY Blood Culture adequate volume Performed at [facility], [address] [place], [place] [address]    Culture   Final    NO GROWTH 5 DAYS  Performed at [facility], [address] [place], [place] [address]    Report Status 09/18/2024 FINAL  Final  Culture, blood (Routine X 2) w Reflex to ID Panel     Status: None   Collection Time: 09/13/24 12:52 AM   Specimen: BLOOD RIGHT ARM  Result Value Ref Range Status   Specimen Description   Final    BLOOD RIGHT ARM Performed at [facility], [address] [place], [place] [address]    Special Requests   Final    BOTTLES DRAWN AEROBIC ONLY Blood Culture adequate  volume Performed at [facility], [address], [place], [place] [address]    Culture   Final    NO GROWTH 5 DAYS Performed at [facility], [address], [place], [place] [address]    Report Status 09/18/2024 FINAL  Final  Surgical PCR screen     Status: None   Collection Time: 09/14/24  1:35 AM   Specimen: Nasal Mucosa; Nasal Swab  Result Value Ref Range Status   MRSA, PCR NEGATIVE NEGATIVE Final   Staphylococcus aureus NEGATIVE NEGATIVE Final    Comment: (NOTE) The Xpert SA Assay (FDA approved for NASAL specimens in patients 21 years of age and older), is one component of a comprehensive surveillance program. It is not intended to diagnose infection nor to guide or monitor treatment. Performed at [facility], [address] [place], [place] [address]   Aerobic/Anaerobic Culture w Gram Stain (surgical/deep wound)     Status: None (Preliminary result)   Collection Time: 09/14/24  9:20 AM   Specimen: Path fluid; Body Fluid  Result Value Ref Range Status   Specimen Description   Final    FLUID Performed at [facility], [address] [place], [place] [address]    Special Requests   Final    NONE Performed at [facility], [address] [place], [place] [address]    Gram Stain   Final    NO WBC SEEN RARE GRAM POSITIVE COCCI IN PAIRS Performed at [facility], [address], [place], [place] [address]    Culture   Final    RARE STAPHYLOCOCCUS AUREUS NO ANAEROBES ISOLATED; CULTURE IN PROGRESS FOR 5 DAYS    Report Status PENDING  Incomplete   Organism ID, Bacteria STAPHYLOCOCCUS AUREUS  Final      Susceptibility   Staphylococcus aureus - MIC*    CIPROFLOXACIN <=0.5 SENSITIVE Sensitive     ERYTHROMYCIN <=0.25 SENSITIVE Sensitive     GENTAMICIN <=0.5 SENSITIVE Sensitive     OXACILLIN <=0.25 SENSITIVE Sensitive     TETRACYCLINE <=1 SENSITIVE Sensitive     VANCOMYCIN  <=0.5  SENSITIVE Sensitive     TRIMETH/SULFA <=10 SENSITIVE Sensitive     CLINDAMYCIN <=0.25 SENSITIVE Sensitive     RIFAMPIN <=0.5 SENSITIVE Sensitive     Inducible Clindamycin NEGATIVE Sensitive     LINEZOLID 1 SENSITIVE Sensitive     * RARE STAPHYLOCOCCUS AUREUS  Culture, blood (Routine X 2) w Reflex to ID Panel     Status: None (Preliminary result)   Collection Time: 09/15/24 10:55 AM   Specimen: BLOOD  Result Value Ref Range Status   Specimen Description   Final    BLOOD BLOOD RIGHT HAND Performed at [facility], [address] [place], [place] [address]    Special Requests   Final    BOTTLES DRAWN AEROBIC ONLY Blood  Culture results may not be optimal due to an inadequate volume of blood received in culture bottles Performed at [facility], [address] [place], [place] [address]    Culture   Final    NO GROWTH 3 DAYS Performed at [facility], [address] [place], [place] [address]    Report Status PENDING  Incomplete  Culture, blood (Routine X 2) w Reflex to ID Panel     Status: None (Preliminary result)   Collection Time: 09/15/24 11:01 AM   Specimen: BLOOD  Result Value Ref Range Status   Specimen Description   Final    BLOOD BLOOD RIGHT HAND Performed at [facility], [address] [place], [place] [address]    Special Requests   Final    BOTTLES DRAWN AEROBIC ONLY Blood Culture results may not be optimal due to an inadequate volume of blood received in culture bottles Performed at [facility], [address], [place], [place] [address]    Culture   Final    NO GROWTH 3 DAYS Performed at [facility], [address], [place], [place] [address]    Report Status PENDING  Incomplete         Radiology Studies: US  EKG SITE RITE Result Date: 09/17/2024 If Site Rite image not attached, placement could not be confirmed due to current cardiac rhythm.  DG CHEST PORT 1  VIEW Result Date: 09/16/2024 EXAM: 1 VIEW(S) XRAY OF THE CHEST 09/16/2024 03:03:00 PM COMPARISON: None available. CLINICAL HISTORY: Dyspnea FINDINGS: LUNGS AND PLEURA: Mild perihilar and basilar interstitial pulmonary edema. Small left pleural effusion. No pneumothorax. HEART AND MEDIASTINUM: Mild cardiomegaly. BONES AND SOFT TISSUES: Left breast surgical clips. Osseous structures are age appropriate. IMPRESSION: 1. Mild CHF with mild perihilar and basilar interstitial pulmonary edema and small left pleural effusion. 2. Mild cardiomegaly. Electronically signed by: Dorethia Molt MD 09/16/2024 10:38 PM EST RP Workstation: [id number]   ECHO TEE Result Date: 09/16/2024    TRANSESOPHOGEAL ECHO REPORT   Patient Name:   [facility] A Phariss Date of Exam: 09/16/2024 Medical Rec #:  [id number]       Height:       65.0 in Accession #:    [id number]      Weight:       224.9 lb Date of Birth:  08-01-1961        BSA:          2.079 m Patient Age:    63 years        BP:           121/74 mmHg Patient Gender: F               HR:           78 bpm. Exam Location:  Inpatient Procedure: Transesophageal Echo, Cardiac Doppler and Color Doppler (Both            Spectral and Color Flow Doppler were utilized during procedure). Indications:     Endocarditis  History:         Patient has prior history of Echocardiogram examinations, most                  recent 09/13/2024. Previous Myocardial Infarction, Pacemaker                  and Defibrillator, hx of cancer and Stroke,  Signs/Symptoms:Bacteremia; Risk Factors:Hypertension,                  Dyslipidemia, Sleep Apnea and Diabetes.  Sonographer:     Koleen Popper RDCS Referring Phys:  [id number] ZANE ADAMS Diagnosing Phys: Wilbert Bihari MD PROCEDURE: After discussion of the risks and benefits of a TEE, an informed consent was obtained from the patient. TEE procedure time was 6 minutes. The transesophogeal probe was passed without difficulty through the esophogus of the patient.  Imaged were  obtained with the patient in a left lateral decubitus position. Sedation performed by different physician. The patient was monitored while under deep sedation. Anesthestetic sedation was provided intravenously by Anesthesiology: 162mg  of Propofol . Image  quality was good. The patient's vital signs; including heart rate, blood pressure, and oxygen saturation; remained stable throughout the procedure. The patient developed no complications during the procedure.  IMPRESSIONS  1. Left ventricular ejection fraction, by estimation, is 60 to 65%. The left ventricle has normal function. The left ventricle has no regional wall motion abnormalities.  2. Right ventricular systolic function is normal. The right ventricular size is normal.  3. No left atrial/left atrial appendage thrombus was detected.  4. The mitral valve is normal in structure. Mild to moderate mitral valve regurgitation. No evidence of mitral stenosis.  5. The aortic valve is normal in structure. Aortic valve regurgitation is not visualized. No aortic stenosis is present.  6. The inferior vena cava is normal in size with greater than 50% respiratory variability, suggesting right atrial pressure of 3 mmHg. Conclusion(s)/Recommendation(s): Normal biventricular function without evidence of hemodynamically significant valvular heart disease. No evidence of vegetation/infective endocarditis on this transesophageael echocardiogram. FINDINGS  Left Ventricle: Left ventricular ejection fraction, by estimation, is 60 to 65%. The left ventricle has normal function. The left ventricle has no regional wall motion abnormalities. The left ventricular internal cavity size was normal in size. There is  no left ventricular hypertrophy. Right Ventricle: The right ventricular size is normal. No increase in right ventricular wall thickness. Right ventricular systolic function is normal. Left Atrium: Left atrial size was normal in size. No left atrial/left atrial  appendage thrombus was detected. Right Atrium: Right atrial size was normal in size. Pericardium: There is no evidence of pericardial effusion. Mitral Valve: The mitral valve is normal in structure. Mild to moderate mitral valve regurgitation. No evidence of mitral valve stenosis. Tricuspid Valve: The tricuspid valve is normal in structure. Tricuspid valve regurgitation is mild . No evidence of tricuspid stenosis. Aortic Valve: The aortic valve is normal in structure. Aortic valve regurgitation is not visualized. No aortic stenosis is present. Pulmonic Valve: The pulmonic valve was normal in structure. Pulmonic valve regurgitation is trivial. No evidence of pulmonic stenosis. Aorta: The aortic root is normal in size and structure. Venous: The inferior vena cava is normal in size with greater than 50% respiratory variability, suggesting right atrial pressure of 3 mmHg. IAS/Shunts: The interatrial septum appears to be lipomatous. No atrial level shunt detected by color flow Doppler. Additional Comments: Spectral Doppler performed. LEFT VENTRICLE PLAX 2D LVOT diam:     2.10 cm LVOT Area:     3.46 cm   AORTA Ao Root diam: 3.00 cm Ao Asc diam:  3.20 cm  SHUNTS Systemic Diam: 2.10 cm Wilbert Bihari MD Electronically signed by Wilbert Bihari MD Signature Date/Time: 09/16/2024/7:33:29 PM    Final (Updated)         Scheduled Meds:  amLODipine  10 mg Oral Daily  enoxaparin (LOVENOX) injection  50 mg Subcutaneous Q24H   fluticasone furoate-vilanterol  1 puff Inhalation Daily   loratadine  10 mg Oral Daily   montelukast  10 mg Oral QHS   mupirocin ointment  1 Application Nasal BID   rosuvastatin  10 mg Oral Daily   Continuous Infusions:   ceFAZolin  (ANCEF ) IV 2 g (09/18/24 0514)     LOS: 7 days    Time spent: 40 minutes    Toribio Hummer, MD [facility]   To contact the attending provider between 7A-7P or the covering provider during after hours 7P-7A, please log into the web site  [email or web address] and access using universal [place] password for that web site. If you do not have the password, please call the hospital operator.  09/18/2024, 11:16 AM

## 2024-09-18 NOTE — Procedures (Signed)
 Interventional Radiology Procedure:   Indications: MSSA bacteremia   Procedure: PICC line placement  Findings: Right basilic vein single lumen PowerPICC, tip at SVC/RA junction.   Complications: None     EBL: Minimal  Plan:  PICC line is ready to use.    Mary Finger R. Philip, MD  Pager: [phone]

## 2024-09-18 NOTE — Discharge Summary (Signed)
 Physician Discharge Summary  Mary Mendez FMW:[id number] DOB: 1961-10-05 DOA: 09/10/2024  PCP: Mary Almarie SAUNDERS, MD  Admit date: 09/10/2024 Discharge date: 09/18/2024  Time spent: 60 minutes  Recommendations for Outpatient Follow-up:  Follow-up with Dr. Dea, ID as scheduled on 10/15/2024 at 2:30 PM. Follow-up with Dr. Loretha, oncology as previously scheduled. Follow-up with Mary Almarie SAUNDERS, MD in 2 weeks.  On follow-up patient's blood pressure need to be reassessed.  Patient will need a basic metabolic profile done to follow-up on electrolytes and renal function.  Patient will need a CBC done to follow-up on counts. Follow-up with Dr. Vernetta, general surgery as scheduled on 09/30/2024.   Discharge Diagnoses:  Principal Problem:   Sepsis (HCC) Active Problems:   Malignant neoplasm of upper-inner quadrant of left breast in female, estrogen receptor positive (HCC)   Class 2 obesity   Low back pain   Transaminitis   Essential hypertension   Hyperkalemia   MSSA bacteremia   Mild intermittent asthma   Hypervolemia   Cough   Discharge Condition: Stable and improved.  Diet recommendation: Regular  Filed Weights   09/16/24 0500 09/17/24 0500 09/18/24 0500  Weight: 102 kg 102 kg 97.8 kg    History of present illness:  HPI per Dr. Celinda Nena A Stumpp is a 63 y.o. female with medical history significant of anxiety, asthma, bronchitis, left breast cancer, COVID-19, hyperlipidemia, hypertension, history of pneumonia class II obesity, constipation, lower back pain who presents to the emergency department with complaints of fever yesterday evening and blood cultures have grown methicillin-sensitive Staphylococcus aureus.  She has been having some erythema and tenderness in the port area.  They were concerned about her surgical incision under her right breast, but there is no erythema or discharge.  This seems to be healing properly.  She rhinorrhea, sore throat, recent  wheezing or hemoptysis, but has been having a dry cough since earlier today.  No chest pain, palpitations, diaphoresis, PND, orthopnea or pitting edema of the lower extremities.  No abdominal pain, nausea, emesis, diarrhea, constipation, melena or hematochezia.  No flank pain, dysuria, frequency or hematuria.  No polyuria, polydipsia or blurred vision.    Lab work: Urinalysis showed trace hemoglobin and protein Eulogio 30 mg/dL.  CBC showed a white count 4.3, hemoglobin 13.2 g/dL and platelets 803.  Coronavirus, influenza and RSV PCR was negative.  Normal PT and INR.  CMP shows sodium 132, potassium 5.5, chloride 97 and CO2 23 mmol/L and corrected calcium 8.9 mg/deciliter.  Renal function is unremarkable.  Glucose 138 mg/dL.  LFTs show a mild transaminitis with AST of 53 and ALT of 54 units/L, but the rest of the hepatic functions were normal.   Imaging: Portable 1 view chest radiograph with mild central vascular congestion with interstitial edema.  CT L-spine showing mild degenerative changes of the visualized thoracolumbar spine.  CT chest/abdomen/pelvis with contrast showing mild subcutaneous stranding along the right chest port, correlate for cellulitis.  New 8 mm solid right lower lobe pulmonary nodule, indeterminate follow-up chest or PET/CT in 3 months.   ED course: Initial vital signs were temperature 99.4 F, pulse 122, respiration 18, BP 173/86 mmHg and O2 sat 98% on room air.  The patient received acetaminophen  1000 mg p.o. x 1, cefepime 2 g IVPB, tinidazole 500 mg IVPB and vancomycin 2000 mg IVPB.   Hospital Course:  #1 sepsis secondary to MSSA bacteremia/Port-A-Cath infection - Patient on admission met criteria for sepsis with fever, tachypnea, noted now to have MSSA  bacteremia and Port-A-Cath site infection. - Status post Port-A-Cath removal per general surgery on 08/14/2024 which noted approximately 20 cc of purulent appearing fluid within the port pocket. - Cultures from port pocket with  MSSA. - Initial blood cultures from 09/10/2024 positive for MSSA. - Repeat blood cultures from 09/15/2024 with no growth to date x 3 days. - Repeat cultures from 09/13/2024 negative x 5 days. - TEE done 09/16/2024 negative for vegetations. - MRI L-spine negative for discitis or osteomyelitis. - ID was following and recommended continuation of IV cefazolin  for 2 weeks from port removal on 09/14/2024. - PICC line ordered however per PICC team due to right Port-A-Cath infection with removal, left arm restriction due to breast cancer unable to place PICC and recommending IR for tunneled line. -IR consult placed for tunneled central line placement. -IR placed a right basilic vein single-lumen power PICC on 10/18/2024. -Patient will be discharged with right basilic vein single-lumen power PICC for home IV antibiotics. -Patient improved clinically and be discharged in stable and improved condition. - Outpatient follow-up with ID on 10/15/2024.   2.  Cough/volume overload -Patient noted with cough of clear sputum during her hospitalization.  -Per patient's daughter cough noted to have worsened during the hospitalization. - Chest x-ray done on 09/16/2024 with concerns for mild CHF with mild perihilar and basilar interstitial pulmonary edema and small left pleural effusion, mild cardiomegaly. - 2D echo from 09/13/2024 with a EF of 60 to 65%,NWMA, mild LVH, trivial MVR, no evidence of mitral stenosis, no evidence of aortic valve stenosis. - Patient noted to be +7 L during this hospitalization. - Patient given Lasix 40 mg IV every 12 hours x 2 doses with urine output of 1.695 L. - Patient with significant clinical improvement. - Outpatient follow-up with PCP.   3.  Left breast cancer -Outpatient follow-up with primary oncologist.   4.  8 mm right lower lobe pulmonary nodule -Patient noted to have a 8 mm right lower lobe pulmonary nodule noted on CT chest and recommending follow-up CT chest or PET/CT  in 3 months. - Discussed with patient who states this has been there. - Outpatient follow-up with oncology.   5.  Hypertension -Controlled on home regimen of Norvasc 10 mg daily. - Patient also given Lasix 40 mg IV every 12 hours x 2 doses during the hospitalization.   - Outpatient follow-up with PCP.    6.  Hyperlipidemia -Statin.   7.  Hyponatremia -Resolved.   8.  Hyperkalemia - Resolved.   9.  Hypokalemia - Repleted during the hospitalization.   10.  Mildly elevated LFTs -Resolved.   11.  Thrombocytopenia -No noted signs of bleeding. - Felt likely secondary to acute infection.   - Thrombocytopenia had resolved by day of discharge.   12.  Mild intermittent asthma - Patient maintained on loratadine, montelukast, as needed albuterol  nebs. - Patient no longer on Advair. -Outpatient follow-up with PCP.   13.  Obesity class II -Lifestyle modification. - Outpatient follow-up with PCP.      Procedures: CT chest abdomen pelvis 09/10/2024 Chest x-ray 09/10/2024, 09/16/2024. MRI L-spine 09/14/2024 2D echo 09/13/2024 TEE 09/16/2024 Port removal per general surgery: Dr.Tsuei 09/14/2024 Right basilic vein single-lumen power PICC placed by IR: Dr. Philip 09/18/2024  Consultations: ID: Dr.Manandhar 09/12/2024 General Surgery: Dr.Tsuei 09/14/2024 Cardiology for TEE  Discharge Exam: Vitals:   09/18/24 0840 09/18/24 1347  BP:  (!) 155/90  Pulse:  87  Resp:  15  Temp:  97.7 F (36.5 C)  SpO2: 96% 97%    General: NAD Cardiovascular: RRR no murmurs rubs or gallops.  No JVD.  No lower extremity edema. Respiratory: Clear to auscultation bilaterally.  No wheezes, no crackles, no rhonchi.  Fair air movement.  Speaking full sentences.  Discharge Instructions   Discharge Instructions     [facility] pharmacist to adjust dose for Vancomycin, Aminoglycosides and other anti-infective therapies as requested by physician.   Complete by: As directed    [facility] to provide Cath Flo 2mg    Complete by: As directed    Administer for PICC line occlusion and as ordered by physician for other access device issues.   Anaphylaxis Kit: Provided to treat any anaphylactic reaction to the medication being provided to the patient if First Dose or when requested by physician   Complete by: As directed    Epinephrine  1mg /ml vial / amp: Administer 0.3mg  (0.93ml) subcutaneously once for moderate to severe anaphylaxis, nurse to call physician and pharmacy when reaction occurs and call 911 if needed for immediate care   Diphenhydramine  50mg /ml IV vial: Administer 25-50mg  IV/IM PRN for first dose reaction, rash, itching, mild reaction, nurse to call physician and pharmacy when reaction occurs   Sodium Chloride  0.9% NS 500ml IV: Administer if needed for hypovolemic blood pressure drop or as ordered by physician after call to physician with anaphylactic reaction   Change dressing on IV access line weekly and PRN   Complete by: As directed    Diet general   Complete by: As directed    Flush IV access with Sodium Chloride  0.9% and Heparin 10 units/ml or 100 units/ml   Complete by: As directed    Home infusion instructions - [facility]   Complete by: As directed    Instructions: Flush IV access with Sodium Chloride  0.9% and Heparin 10units/ml or 100units/ml   Change dressing on IV access line: Weekly and PRN   Instructions Cath Flo 2mg : Administer for PICC Line occlusion and as ordered by physician for other access device   [facility] pharmacist to adjust dose for: Vancomycin, Aminoglycosides and other anti-infective therapies as requested by physician   Increase activity slowly   Complete by: As directed    Method of administration may be changed at the discretion of home infusion pharmacist based upon assessment of the patient and/or caregiver's ability to self-administer the medication ordered   Complete by: As directed    No wound  care   Complete by: As directed       Allergies as of 09/18/2024   No Known Allergies      Medication List     PAUSE taking these medications    oxyCODONE  5 MG immediate release tablet Wait to take this until your doctor or other care provider tells you to start again. Commonly known as: Oxy IR/ROXICODONE  Take 1 tablet (5 mg total) by mouth every 6 (six) hours as needed for up to 7 days for severe pain (pain score 7-10). Ask about: Should I take this medication?       TAKE these medications    acetaminophen  500 MG tablet Commonly known as: TYLENOL  Take 1,000 mg by mouth every 6 (six) hours as needed for mild pain.   albuterol  108 (90 Base) MCG/ACT inhaler Commonly known as: VENTOLIN  HFA Inhale into the lungs.   amLODipine 10 MG tablet Commonly known as: NORVASC Take 10 mg by mouth daily.   busPIRone 10 MG tablet Commonly known as: BUSPAR Take  10 mg by mouth 3 (three) times daily as needed (Anxiety).   ceFAZolin  IVPB Commonly known as: ANCEF  Inject 2 g into the vein every 8 (eight) hours for 10 days. Indication:  MSSA bacteremia First Dose: Yes Last Day of Therapy:  09/27/24 Labs - Once weekly:  CBC/D and BMP, Labs - Once weekly: ESR and CRP Method of administration: IV Push Method of administration may be changed at the discretion of home infusion pharmacist based upon assessment of the patient and/or caregiver's ability to self-administer the medication ordered.   dexamethasone  4 MG tablet Commonly known as: DECADRON  Take 2 tabs by mouth 2 times daily starting day before chemo. Then take 2 tabs daily for 2 days starting day after chemo. Take with food.   fluticasone-salmeterol 250-50 MCG/ACT Aepb Commonly known as: ADVAIR Inhale 1 puff into the lungs 2 (two) times daily.   guaiFENesin-dextromethorphan 100-10 MG/5ML syrup Commonly known as: ROBITUSSIN DM Take 5 mLs by mouth every 4 (four) hours as needed for cough.   lidocaine -prilocaine cream Commonly  known as: EMLA Apply to affected area once   loratadine 10 MG tablet Commonly known as: CLARITIN Take 10 mg by mouth daily.   montelukast 10 MG tablet Commonly known as: SINGULAIR Take 10 mg by mouth daily.   nystatin powder Commonly known as: MYCOSTATIN/NYSTOP Apply 1 Application topically 3 (three) times daily.   ondansetron  8 MG tablet Commonly known as: Zofran  Take 1 tablet (8 mg total) by mouth every 8 (eight) hours as needed for nausea or vomiting. Start on the third day after chemotherapy.   prochlorperazine  10 MG tablet Commonly known as: COMPAZINE  Take 1 tablet (10 mg total) by mouth every 6 (six) hours as needed for nausea or vomiting.   rosuvastatin 10 MG tablet Commonly known as: CRESTOR Take 10 mg by mouth daily.   tretinoin 0.1 % cream Commonly known as: RETIN-A Apply 1 application  topically at bedtime.   zolpidem 5 MG tablet Commonly known as: AMBIEN Take 1 tablet (5 mg total) by mouth at bedtime as needed for sleep.               Discharge Care Instructions  (From admission, onward)           Start     Ordered   09/18/24 0000  Change dressing on IV access line weekly and PRN  (Home infusion instructions - [facility] )        09/18/24 1057           No Known Allergies  Follow-up Information     Vernetta Berg, MD Follow up on 09/30/2024.   Specialty: General Surgery Why: 2:30pm, Arrive 15 minutes prior to your appointment time, Please bring your insurance card and photo ID Contact information: [address] [place] [address] [phone]         Mary Almarie SAUNDERS, MD. Schedule an appointment as soon as possible for a visit in 2 week(s).   Specialty: Family Medicine Contact information: [address] [place] [address] [phone]         Loretha Ash, MD Follow up.   Specialty: Hematology and Oncology Why: Follow-up as scheduled. Contact information: [address] [place] [address] [phone]         Dea Shiner, MD Follow up on 10/15/2024.   Specialty: Infectious Diseases Why: Follow-up at 2:30 PM Contact information: [address] [employer] [address] [place] [address] [phone]  The results of significant diagnostics from this hospitalization (including imaging, microbiology, ancillary and laboratory) are listed below for reference.    Significant Diagnostic Studies: US  EKG SITE RITE Result Date: 09/17/2024 If Site Rite image not attached, placement could not be confirmed due to current cardiac rhythm.  DG CHEST PORT 1 VIEW Result Date: 09/16/2024 EXAM: 1 VIEW(S) XRAY OF THE CHEST 09/16/2024 03:03:00 PM COMPARISON: None available. CLINICAL HISTORY: Dyspnea FINDINGS: LUNGS AND PLEURA: Mild perihilar and basilar interstitial pulmonary edema. Small left pleural effusion. No pneumothorax. HEART AND MEDIASTINUM: Mild cardiomegaly. BONES AND SOFT TISSUES: Left breast surgical clips. Osseous structures are age appropriate. IMPRESSION: 1. Mild CHF with mild perihilar and basilar interstitial pulmonary edema and small left pleural effusion. 2. Mild cardiomegaly. Electronically signed by: Dorethia Molt MD 09/16/2024 10:38 PM EST RP Workstation: [id number]   ECHO TEE Result Date: 09/16/2024    TRANSESOPHOGEAL ECHO REPORT   Patient Name:   Mary Mendez Date of Exam: 09/16/2024 Medical Rec #:  [id number]       Height:       65.0 in Accession #:    [id number]      Weight:       224.9 lb Date of Birth:  [birth year]        BSA:          2.079 m Patient Age:    63 years        BP:           121/74 mmHg Patient Gender: F               HR:           78 bpm. Exam Location:  Inpatient Procedure: Transesophageal Echo, Cardiac Doppler and Color Doppler (Both            Spectral and Color Flow Doppler were utilized during procedure). Indications:     Endocarditis  History:         Patient has prior history of Echocardiogram  examinations, most                  recent 09/13/2024. Previous Myocardial Infarction, Pacemaker                  and Defibrillator, hx of cancer and Stroke,                  Signs/Symptoms:Bacteremia; Risk Factors:Hypertension,                  Dyslipidemia, Sleep Apnea and Diabetes.  Sonographer:     Koleen Popper RDCS Referring Phys:  [id number] ZANE ADAMS Diagnosing Phys: Wilbert Bihari MD PROCEDURE: After discussion of the risks and benefits of a TEE, an informed consent was obtained from the patient. TEE procedure time was 6 minutes. The transesophogeal probe was passed without difficulty through the esophogus of the patient. Imaged were  obtained with the patient in a left lateral decubitus position. Sedation performed by different physician. The patient was monitored while under deep sedation. Anesthestetic sedation was provided intravenously by Anesthesiology: 162mg  of Propofol . Image  quality was good. The patient's vital signs; including heart rate, blood pressure, and oxygen saturation; remained stable throughout the procedure. The patient developed no complications during the procedure.  IMPRESSIONS  1. Left ventricular ejection fraction, by estimation, is 60 to 65%. The left ventricle has normal function. The left ventricle has no regional wall motion abnormalities.  2. Right ventricular systolic function is normal. The right ventricular size  is normal.  3. No left atrial/left atrial appendage thrombus was detected.  4. The mitral valve is normal in structure. Mild to moderate mitral valve regurgitation. No evidence of mitral stenosis.  5. The aortic valve is normal in structure. Aortic valve regurgitation is not visualized. No aortic stenosis is present.  6. The inferior vena cava is normal in size with greater than 50% respiratory variability, suggesting right atrial pressure of 3 mmHg. Conclusion(s)/Recommendation(s): Normal biventricular function without evidence of hemodynamically significant valvular  heart disease. No evidence of vegetation/infective endocarditis on this transesophageael echocardiogram. FINDINGS  Left Ventricle: Left ventricular ejection fraction, by estimation, is 60 to 65%. The left ventricle has normal function. The left ventricle has no regional wall motion abnormalities. The left ventricular internal cavity size was normal in size. There is  no left ventricular hypertrophy. Right Ventricle: The right ventricular size is normal. No increase in right ventricular wall thickness. Right ventricular systolic function is normal. Left Atrium: Left atrial size was normal in size. No left atrial/left atrial appendage thrombus was detected. Right Atrium: Right atrial size was normal in size. Pericardium: There is no evidence of pericardial effusion. Mitral Valve: The mitral valve is normal in structure. Mild to moderate mitral valve regurgitation. No evidence of mitral valve stenosis. Tricuspid Valve: The tricuspid valve is normal in structure. Tricuspid valve regurgitation is mild . No evidence of tricuspid stenosis. Aortic Valve: The aortic valve is normal in structure. Aortic valve regurgitation is not visualized. No aortic stenosis is present. Pulmonic Valve: The pulmonic valve was normal in structure. Pulmonic valve regurgitation is trivial. No evidence of pulmonic stenosis. Aorta: The aortic root is normal in size and structure. Venous: The inferior vena cava is normal in size with greater than 50% respiratory variability, suggesting right atrial pressure of 3 mmHg. IAS/Shunts: The interatrial septum appears to be lipomatous. No atrial level shunt detected by color flow Doppler. Additional Comments: Spectral Doppler performed. LEFT VENTRICLE PLAX 2D LVOT diam:     2.10 cm LVOT Area:     3.46 cm   AORTA Ao Root diam: 3.00 cm Ao Asc diam:  3.20 cm  SHUNTS Systemic Diam: 2.10 cm Wilbert Bihari MD Electronically signed by Wilbert Bihari MD Signature Date/Time: 09/16/2024/7:33:29 PM    Final (Updated)     EP STUDY Result Date: 09/16/2024 See surgical note for result.  MR Lumbar Spine W Wo Contrast Result Date: 09/14/2024 EXAM: MRI LUMBAR SPINE 09/14/2024 01:10:41 PM TECHNIQUE: Multiplanar multisequence MRI of the lumbar spine was performed without and with the administration of intravenous contrast. COMPARISON: None available. CLINICAL HISTORY: bacteremia, r/o discitis and osteomyelitis FINDINGS: BONES AND ALIGNMENT: Normal alignment. Normal vertebral body heights. Bone marrow signal is unremarkable. SPINAL CORD: The conus terminates normally. SOFT TISSUES: No paraspinal mass. L1-L2: No significant disc herniation. No spinal canal stenosis or neural foraminal narrowing. L2-L3: No significant disc herniation. No spinal canal stenosis or neural foraminal narrowing. L3-L4: No significant disc herniation. No spinal canal stenosis or neural foraminal narrowing. L4-L5: Facet degenerative changes are present. No focal disc protrusion or stenosis. L5-S1: Facet degenerative changes are present. No focal disc protrusion or stenosis. IMPRESSION: 1. No evidence of discitis or osteomyelitis. Electronically signed by: Lonni Necessary MD 09/14/2024 01:17 PM EST RP Workstation: [id number]   ECHOCARDIOGRAM COMPLETE Result Date: 09/13/2024    ECHOCARDIOGRAM REPORT   Patient Name:   Mary Mendez Date of Exam: 09/13/2024 Medical Rec #:  [id number]       Height:  65.0 in Accession #:    [id number]      Weight:       218.0 lb Date of Birth:  07-26-1961        BSA:          2.052 m Patient Age:    63 years        BP:           111/70 mmHg Patient Gender: F               HR:           83 bpm. Exam Location:  Inpatient Procedure: 2D Echo and Strain Analysis (Both Spectral and Color Flow Doppler            were utilized during procedure). Indications:    Bacteremia  History:        Patient has no prior history of Echocardiogram examinations.  Sonographer:    Charmaine Gaskins Referring Phys: [id number] [facility]  IMPRESSIONS  1. Left ventricular ejection fraction, by estimation, is 60 to 65%. The left ventricle has normal function. The left ventricle has no regional wall motion abnormalities. There is mild left ventricular hypertrophy. Left ventricular diastolic parameters were normal. The average left ventricular global longitudinal strain is -21.3 %. The global longitudinal strain is normal.  2. Right ventricular systolic function is normal. The right ventricular size is normal. There is normal pulmonary artery systolic pressure. The estimated right ventricular systolic pressure is 31.3 mmHg.  3. The mitral valve is normal in structure. Trivial mitral valve regurgitation. No evidence of mitral stenosis.  4. The aortic valve is tricuspid. Aortic valve regurgitation is not visualized. Aortic valve sclerosis is present, with no evidence of aortic valve stenosis.  5. The inferior vena cava is normal in size with greater than 50% respiratory variability, suggesting right atrial pressure of 3 mmHg. Conclusion(s)/Recommendation(s): No evidence of valvular vegetations on this transthoracic echocardiogram. Consider a transesophageal echocardiogram to exclude infective endocarditis if clinically indicated. FINDINGS  Left Ventricle: Left ventricular ejection fraction, by estimation, is 60 to 65%. The left ventricle has normal function. The left ventricle has no regional wall motion abnormalities. The average left ventricular global longitudinal strain is -21.3 %. Strain was performed and the global longitudinal strain is normal. The left ventricular internal cavity size was normal in size. There is mild left ventricular hypertrophy. Left ventricular diastolic parameters were normal. Right Ventricle: The right ventricular size is normal. No increase in right ventricular wall thickness. Right ventricular systolic function is normal. There is normal pulmonary artery systolic pressure. The tricuspid regurgitant velocity is 2.66 m/s, and   with an assumed right atrial pressure of 3 mmHg, the estimated right ventricular systolic pressure is 31.3 mmHg. Left Atrium: Left atrial size was normal in size. Right Atrium: Right atrial size was normal in size. Pericardium: Trivial pericardial effusion is present. Mitral Valve: The mitral valve is normal in structure. Trivial mitral valve regurgitation. No evidence of mitral valve stenosis. Tricuspid Valve: The tricuspid valve is normal in structure. Tricuspid valve regurgitation is trivial. Aortic Valve: The aortic valve is tricuspid. Aortic valve regurgitation is not visualized. Aortic valve sclerosis is present, with no evidence of aortic valve stenosis. Aortic valve mean gradient measures 8.0 mmHg. Aortic valve peak gradient measures 14.4 mmHg. Aortic valve area, by VTI measures 2.20 cm. Pulmonic Valve: The pulmonic valve was not well visualized. Pulmonic valve regurgitation is trivial. Aorta: The aortic root and ascending aorta are structurally normal, with no evidence  of dilitation. Venous: The inferior vena cava is normal in size with greater than 50% respiratory variability, suggesting right atrial pressure of 3 mmHg. IAS/Shunts: The interatrial septum was not well visualized.  LEFT VENTRICLE PLAX 2D LVIDd:         5.30 cm   Diastology LVIDs:         3.60 cm   LV e' medial:    8.81 cm/s LV PW:         1.00 cm   LV E/e' medial:  11.5 LV IVS:        1.00 cm   LV e' lateral:   9.46 cm/s LVOT diam:     2.00 cm   LV E/e' lateral: 10.7 LV SV:         74 LV SV Index:   36        2D Longitudinal Strain LVOT Area:     3.14 cm  2D Strain GLS Avg:     -21.3 %  RIGHT VENTRICLE RV Basal diam:  3.10 cm RV Mid diam:    3.10 cm RV S prime:     16.60 cm/s LEFT ATRIUM             Index        RIGHT ATRIUM           Index LA diam:        4.00 cm 1.95 cm/m   RA Area:     17.50 cm LA Vol (A2C):   48.0 ml 23.39 ml/m  RA Volume:   45.40 ml  22.12 ml/m LA Vol (A4C):   51.1 ml 24.90 ml/m LA Biplane Vol: 53.6 ml 26.12  ml/m  AORTIC VALVE AV Area (Vmax):    2.33 cm AV Area (Vmean):   2.23 cm AV Area (VTI):     2.20 cm AV Vmax:           190.00 cm/s AV Vmean:          131.000 cm/s AV VTI:            0.335 m AV Peak Grad:      14.4 mmHg AV Mean Grad:      8.0 mmHg LVOT Vmax:         141.00 cm/s LVOT Vmean:        93.100 cm/s LVOT VTI:          0.235 m LVOT/AV VTI ratio: 0.70  AORTA Ao Root diam: 2.80 cm Ao Asc diam:  3.20 cm MITRAL VALVE                TRICUSPID VALVE MV Area (PHT): 3.89 cm     TR Peak grad:   28.3 mmHg MV Decel Time: 195 msec     TR Vmax:        266.00 cm/s MV E velocity: 101.00 cm/s MV A velocity: 93.50 cm/s   SHUNTS MV E/A ratio:  1.08         Systemic VTI:  0.24 m                             Systemic Diam: 2.00 cm Lonni Nanas MD Electronically signed by Lonni Nanas MD Signature Date/Time: 09/13/2024/2:42:12 PM    Final    CT L-SPINE NO CHARGE Result Date: 09/10/2024 EXAM: CT OF THE LUMBAR SPINE WITHOUT CONTRAST 09/10/2024 09:27:51 PM TECHNIQUE: CT of the lumbar spine was performed without the administration  of intravenous contrast. Multiplanar reformatted images are provided for review. Automated exposure control, iterative reconstruction, and/or weight based adjustment of the mA/kV was utilized to reduce the radiation dose to as low as reasonably achievable. COMPARISON: None available. CLINICAL HISTORY: FINDINGS: BONES AND ALIGNMENT: Normal vertebral body heights. No acute fracture or suspicious bone lesion. Normal alignment. DEGENERATIVE CHANGES: Mild degenerative changes of the visualized thoracolumbar spine. SOFT TISSUES: No acute abnormality. IMPRESSION: 1. Mild degenerative changes of the visualized thoracolumbar spine. Electronically signed by: Pinkie Pebbles MD 09/10/2024 09:35 PM EST RP Workstation: [id number]   CT CHEST ABDOMEN PELVIS W CONTRAST Result Date: 09/10/2024 EXAM: CT CHEST, ABDOMEN AND PELVIS WITH CONTRAST 09/10/2024 09:27:51 PM TECHNIQUE: CT of the chest,  abdomen and pelvis was performed with the administration of 100 mL of iohexol (OMNIPAQUE) 300 MG/ML solution. Multiplanar reformatted images are provided for review. Automated exposure control, iterative reconstruction, and/or weight based adjustment of the mA/kV was utilized to reduce the radiation dose to as low as reasonably achievable. COMPARISON: CT abdomen and pelvis dated 10/10/2022 and CT chest dated 04/27/2022. CLINICAL HISTORY: Fever of unknown origin (Ped > 59mo); fever, port with swelling. FINDINGS: CHEST: MEDIASTINUM AND LYMPH NODES: Heart and pericardium are unremarkable. The central airways are clear. No mediastinal, hilar or axillary lymphadenopathy. Right chest port terminates in the lower SVC. LUNGS AND PLEURA: 8 mm right lower lobe nodule (image 63), new from prior. Follow-up CT chest or PET CT is suggested in 3 months. Mild left basilar scarring/atelectasis. No pleural effusion or pneumothorax. ABDOMEN AND PELVIS: LIVER: The liver is unremarkable. GALLBLADDER AND BILE DUCTS: Status post cholecystectomy. No biliary ductal dilatation. SPLEEN: No acute abnormality. PANCREAS: No acute abnormality. ADRENAL GLANDS: No acute abnormality. KIDNEYS, URETERS AND BLADDER: Scattered subcentimeter right renal cysts, benign (Bosniak I). No follow-up is recommended. No stones in the kidneys or ureters. No hydronephrosis. No perinephric or periureteral stranding. Urinary bladder is unremarkable. GI AND BOWEL: Stomach demonstrates no acute abnormality. Mild sigmoid diverticulosis, without evidence of diverticulitis. There is no bowel obstruction. REPRODUCTIVE ORGANS: Status post hysterectomy. PERITONEUM AND RETROPERITONEUM: No ascites. No free air. VASCULATURE: Aorta is normal in caliber. ABDOMINAL AND PELVIS LYMPH NODES: No lymphadenopathy. BONES AND SOFT TISSUES: Degenerative changes of the visualized thoracolumbar spine. Status post left breast lumpectomy and bilateral mammoplasties. Surgical clips in the left  axilla. No acute osseous abnormality. Mild subcutaneous stranding along the patient's right chest port (images 4 and 7), correlate for cellulitis . IMPRESSION: 1. Mild subcutaneous standing along the right chest port, correlate for cellulitis. 2. New 8 mm solid right lower lobe pulmonary nodule, indeterminate. Follow-up CT chest or PET-CT is suggested in 3 months. Electronically signed by: Pinkie Pebbles MD 09/10/2024 09:34 PM EST RP Workstation: [id number]   DG Chest Port 1 View if patient is in a treatment room. Result Date: 09/10/2024 EXAM: 1 VIEW(S) XRAY OF THE CHEST 09/10/2024 07:00:00 PM COMPARISON: 08/18/2024 CLINICAL HISTORY: Possible sepsis and back pain. FINDINGS: LUNGS AND PLEURA: Mild central vascular congestion is noted without interstitial edema. No focal infiltrate is seen. No pleural effusion. No pneumothorax. HEART AND MEDIASTINUM: The cardiac shadow is within normal limits. BONES AND SOFT TISSUES: No bony abnormality is noted. IMPRESSION: 1. Mild central vascular congestion without interstitial edema. Electronically signed by: Oneil Devonshire MD 09/10/2024 07:05 PM EST RP Workstation: [id number]   VAS US  UPPER EXTREMITY VENOUS DUPLEX Result Date: 09/08/2024 UPPER VENOUS STUDY  Patient Name:  Mary Mendez  Date of Exam:   09/08/2024 Medical Rec #: [id number]  Accession #:    [id number] Date of Birth: 20-Oct-1961         Patient Gender: F Patient Age:   69 years Exam Location:  [address] Procedure:      VAS US  UPPER EXTREMITY VENOUS DUPLEX Referring Phys: KAITLYN WALISIEWICZ --------------------------------------------------------------------------------  Indications: Swelling Risk Factors: Obesity. Comparison Study: None. Performing Technologist: Garnette Rockers  Examination Guidelines: A complete evaluation includes B-mode imaging, spectral Doppler, color Doppler, and power Doppler as needed of all accessible portions of each vessel. Bilateral testing is considered an integral part  of a complete examination. Limited examinations for reoccurring indications may be performed as noted.  Right Findings: +----------+------------+---------+-----------+----------+-------+ RIGHT     CompressiblePhasicitySpontaneousPropertiesSummary +----------+------------+---------+-----------+----------+-------+ IJV           Full       Yes       Yes                      +----------+------------+---------+-----------+----------+-------+ Subclavian               Yes       Yes                      +----------+------------+---------+-----------+----------+-------+ Axillary      Full       Yes       Yes                      +----------+------------+---------+-----------+----------+-------+ Brachial      Full       Yes       Yes                      +----------+------------+---------+-----------+----------+-------+ Radial        Full       Yes       Yes                      +----------+------------+---------+-----------+----------+-------+ Ulnar         Full       Yes       Yes                      +----------+------------+---------+-----------+----------+-------+ Cephalic      Full                 Yes                      +----------+------------+---------+-----------+----------+-------+ Basilic       Full                 Yes                      +----------+------------+---------+-----------+----------+-------+  Left Findings: +----------+------------+---------+-----------+----------+-------+ LEFT      CompressiblePhasicitySpontaneousPropertiesSummary +----------+------------+---------+-----------+----------+-------+ Subclavian               Yes       Yes                      +----------+------------+---------+-----------+----------+-------+  Summary:  Right: No evidence of deep vein thrombosis in the upper extremity. No evidence of superficial vein thrombosis in the upper extremity.  Left: No evidence of thrombosis in the subclavian.  *See  table(s) above for measurements and observations.  Diagnosing physician: Gaile New MD Electronically signed by Gaile New MD on 09/08/2024 at 3:52:30 PM.  Final     Microbiology: Recent Results (from the past 240 hours)  Culture, blood (Routine x 2)     Status: Abnormal   Collection Time: 09/10/24  6:49 PM   Specimen: BLOOD  Result Value Ref Range Status   Specimen Description   Final    BLOOD RIGHT ANTECUBITAL Performed at [facility], [address], [place], [place] [address]    Special Requests   Final    Blood Culture adequate volume BOTTLES DRAWN AEROBIC AND ANAEROBIC Performed at [facility], [address], [place], [place] [address]    Culture  Setup Time   Final    GRAM POSITIVE COCCI IN CLUSTERS IN BOTH AEROBIC AND ANAEROBIC BOTTLES CRITICAL RESULT CALLED TO, READ BACK BY AND VERIFIED WITH: PHARMD CRYSTAL ROBERTSON ON 09/11/24 @ 1320 BY DRT Performed at [facility], [address], [place], [place] [address]    Culture STAPHYLOCOCCUS AUREUS (A)  Final   Report Status 09/13/2024 FINAL  Final   Organism ID, Bacteria STAPHYLOCOCCUS AUREUS  Final      Susceptibility   Staphylococcus aureus - MIC*    CIPROFLOXACIN <=0.5 SENSITIVE Sensitive     ERYTHROMYCIN <=0.25 SENSITIVE Sensitive     GENTAMICIN <=0.5 SENSITIVE Sensitive     OXACILLIN <=0.25 SENSITIVE Sensitive     TETRACYCLINE <=1 SENSITIVE Sensitive     VANCOMYCIN <=0.5 SENSITIVE Sensitive     TRIMETH/SULFA <=10 SENSITIVE Sensitive     CLINDAMYCIN <=0.25 SENSITIVE Sensitive     RIFAMPIN <=0.5 SENSITIVE Sensitive     Inducible Clindamycin NEGATIVE Sensitive     LINEZOLID 1 SENSITIVE Sensitive     * STAPHYLOCOCCUS AUREUS  Blood Culture ID Panel (Reflexed)     Status: Abnormal   Collection Time: 09/10/24  6:49 PM  Result Value Ref Range Status   Enterococcus faecalis NOT DETECTED NOT DETECTED Final   Enterococcus Faecium NOT DETECTED NOT DETECTED Final    Listeria monocytogenes NOT DETECTED NOT DETECTED Final   Staphylococcus species DETECTED (A) NOT DETECTED Final    Comment: CRITICAL RESULT CALLED TO, READ BACK BY AND VERIFIED WITH: PHARMD CRYSTAL ROBERTSON ON 09/11/24 @ 1320 BY DRT    Staphylococcus aureus (BCID) DETECTED (A) NOT DETECTED Final    Comment: CRITICAL RESULT CALLED TO, READ BACK BY AND VERIFIED WITH: PHARMD CRYSTAL ROBERTSON ON 09/11/24 @ 1320 BY DRT    Staphylococcus epidermidis NOT DETECTED NOT DETECTED Final   Staphylococcus lugdunensis NOT DETECTED NOT DETECTED Final   Streptococcus species NOT DETECTED NOT DETECTED Final   Streptococcus agalactiae NOT DETECTED NOT DETECTED Final   Streptococcus pneumoniae NOT DETECTED NOT DETECTED Final   Streptococcus pyogenes NOT DETECTED NOT DETECTED Final   A.calcoaceticus-baumannii NOT DETECTED NOT DETECTED Final   Bacteroides fragilis NOT DETECTED NOT DETECTED Final   Enterobacterales NOT DETECTED NOT DETECTED Final   Enterobacter cloacae complex NOT DETECTED NOT DETECTED Final   Escherichia coli NOT DETECTED NOT DETECTED Final   Klebsiella aerogenes NOT DETECTED NOT DETECTED Final   Klebsiella oxytoca NOT DETECTED NOT DETECTED Final   Klebsiella pneumoniae NOT DETECTED NOT DETECTED Final   Proteus species NOT DETECTED NOT DETECTED Final   Salmonella species NOT DETECTED NOT DETECTED Final   Serratia marcescens NOT DETECTED NOT DETECTED Final   Haemophilus influenzae NOT DETECTED NOT DETECTED Final   Neisseria meningitidis NOT DETECTED NOT DETECTED Final   Pseudomonas aeruginosa NOT DETECTED NOT DETECTED Final   Stenotrophomonas maltophilia NOT DETECTED NOT DETECTED Final   Candida  albicans NOT DETECTED NOT DETECTED Final   Candida auris NOT DETECTED NOT DETECTED Final   Candida glabrata NOT DETECTED NOT DETECTED Final   Candida krusei NOT DETECTED NOT DETECTED Final   Candida parapsilosis NOT DETECTED NOT DETECTED Final   Candida tropicalis NOT DETECTED NOT DETECTED  Final   Cryptococcus neoformans/gattii NOT DETECTED NOT DETECTED Final   Meth resistant mecA/C and MREJ NOT DETECTED NOT DETECTED Final    Comment: Performed at [facility], [address] [place], [place] [address]  Resp panel by RT-PCR (RSV, Flu A&B, Covid) Anterior Nasal Swab     Status: None   Collection Time: 09/10/24  8:36 PM   Specimen: Anterior Nasal Swab  Result Value Ref Range Status   SARS Coronavirus 2 by RT PCR NEGATIVE NEGATIVE Final    Comment: (NOTE) SARS-CoV-2 target nucleic acids are NOT DETECTED.  The SARS-CoV-2 RNA is generally detectable in upper respiratory specimens during the acute phase of infection. The lowest concentration of SARS-CoV-2 viral copies this assay can detect is 138 copies/mL. A negative result does not preclude SARS-Cov-2 infection and should not be used as the sole basis for treatment or other patient management decisions. A negative result may occur with  improper specimen collection/handling, submission of specimen other than nasopharyngeal swab, presence of viral mutation(s) within the areas targeted by this assay, and inadequate number of viral copies(<138 copies/mL). A negative result must be combined with clinical observations, patient history, and epidemiological information. The expected result is Negative.  Fact Sheet for Patients:  bloggercourse.com  Fact Sheet for Healthcare Providers:  seriousbroker.it  This test is no t yet approved or cleared by the [place]  FDA and  has been authorized for detection and/or diagnosis of SARS-CoV-2 by FDA under an Emergency Use Authorization (EUA). This EUA will remain  in effect (meaning this test can be used) for the duration of the COVID-19 declaration under Section 564(b)(1) of the Act, 21 U.S.C.section 360bbb-3(b)(1), unless the authorization is terminated  or revoked sooner.       Influenza A by PCR NEGATIVE NEGATIVE Final    Influenza B by PCR NEGATIVE NEGATIVE Final    Comment: (NOTE) The Xpert Xpress SARS-CoV-2/FLU/RSV plus assay is intended as an aid in the diagnosis of influenza from Nasopharyngeal swab specimens and should not be used as a sole basis for treatment. Nasal washings and aspirates are unacceptable for Xpert Xpress SARS-CoV-2/FLU/RSV testing.  Fact Sheet for Patients: bloggercourse.com  Fact Sheet for Healthcare Providers: seriousbroker.it  This test is not yet approved or cleared by the [place]  FDA and has been authorized for detection and/or diagnosis of SARS-CoV-2 by FDA under an Emergency Use Authorization (EUA). This EUA will remain in effect (meaning this test can be used) for the duration of the COVID-19 declaration under Section 564(b)(1) of the Act, 21 U.S.C. section 360bbb-3(b)(1), unless the authorization is terminated or revoked.     Resp Syncytial Virus by PCR NEGATIVE NEGATIVE Final    Comment: (NOTE) Fact Sheet for Patients: bloggercourse.com  Fact Sheet for Healthcare Providers: seriousbroker.it  This test is not yet approved or cleared by the [place]  FDA and has been authorized for detection and/or diagnosis of SARS-CoV-2 by FDA under an Emergency Use Authorization (EUA). This EUA will remain in effect (meaning this test can be used) for the duration of the COVID-19 declaration under Section 564(b)(1) of the Act, 21 U.S.C. section 360bbb-3(b)(1), unless the authorization is terminated or revoked.  Performed at [facility], [address] [place], [place] [address]   Culture, blood (Routine X 2) w Reflex to ID Panel     Status: None   Collection Time: 09/13/24 12:52 AM   Specimen: BLOOD RIGHT ARM  Result Value Ref Range Status   Specimen Description   Final    BLOOD RIGHT ARM Performed at [facility], [address], [place], [place] [address]    Special Requests   Final    BOTTLES DRAWN AEROBIC ONLY Blood Culture adequate volume Performed at [facility], [address], [place], [place] [address]    Culture   Final    NO GROWTH 5 DAYS Performed at [facility], [address], [place], [place] [address]    Report Status 09/18/2024 FINAL  Final  Culture, blood (Routine X 2) w Reflex to ID Panel     Status: None   Collection Time: 09/13/24 12:52 AM   Specimen: BLOOD RIGHT ARM  Result Value Ref Range Status   Specimen Description   Final    BLOOD RIGHT ARM Performed at [facility], [address], [place], [place] [address]    Special Requests   Final    BOTTLES DRAWN AEROBIC ONLY Blood Culture adequate volume Performed at [facility], [address], [place], [place] [address]    Culture   Final    NO GROWTH 5 DAYS Performed at [facility], [address], [place], [place] [address]    Report Status 09/18/2024 FINAL  Final  Surgical PCR screen     Status: None   Collection Time: 09/14/24  1:35 AM   Specimen: Nasal Mucosa; Nasal Swab  Result Value Ref Range Status   MRSA, PCR NEGATIVE NEGATIVE Final   Staphylococcus aureus NEGATIVE NEGATIVE Final    Comment: (NOTE) The Xpert SA Assay (FDA approved for NASAL specimens in patients 22 years of age and older), is one component of a comprehensive surveillance program. It is not intended to diagnose infection nor to guide or monitor treatment. Performed at [facility], [address], [place], [place] [address]   Aerobic/Anaerobic Culture w Gram Stain (surgical/deep wound)     Status: None (Preliminary result)   Collection Time: 09/14/24  9:20 AM   Specimen: Path fluid; Body Fluid  Result Value Ref Range Status   Specimen Description   Final    FLUID Performed at [facility], [address], [place], [place] [address]    Special Requests    Final    NONE Performed at [facility], [address], [place], [place] [address]    Gram Stain   Final    NO WBC SEEN RARE GRAM POSITIVE COCCI IN PAIRS Performed at [facility], [address] [place], [place] [address]    Culture   Final    RARE STAPHYLOCOCCUS AUREUS NO ANAEROBES ISOLATED; CULTURE IN PROGRESS FOR 5 DAYS    Report Status PENDING  Incomplete   Organism ID, Bacteria STAPHYLOCOCCUS AUREUS  Final      Susceptibility   Staphylococcus aureus - MIC*    CIPROFLOXACIN <=0.5 SENSITIVE Sensitive     ERYTHROMYCIN <=0.25 SENSITIVE Sensitive     GENTAMICIN <=0.5 SENSITIVE Sensitive     OXACILLIN <=0.25 SENSITIVE Sensitive     TETRACYCLINE <=1 SENSITIVE Sensitive     VANCOMYCIN <=0.5 SENSITIVE Sensitive     TRIMETH/SULFA <=10 SENSITIVE Sensitive  CLINDAMYCIN <=0.25 SENSITIVE Sensitive     RIFAMPIN <=0.5 SENSITIVE Sensitive     Inducible Clindamycin NEGATIVE Sensitive     LINEZOLID 1 SENSITIVE Sensitive     * RARE STAPHYLOCOCCUS AUREUS  Culture, blood (Routine X 2) w Reflex to ID Panel     Status: None (Preliminary result)   Collection Time: 09/15/24 10:55 AM   Specimen: BLOOD  Result Value Ref Range Status   Specimen Description   Final    BLOOD BLOOD RIGHT HAND Performed at [facility], [address], [place], [place] [address]    Special Requests   Final    BOTTLES DRAWN AEROBIC ONLY Blood Culture results may not be optimal due to an inadequate volume of blood received in culture bottles Performed at [facility], [address], [place], [place] [address]    Culture   Final    NO GROWTH 3 DAYS Performed at [facility], [address], [place], [place] [address]    Report Status PENDING  Incomplete  Culture, blood (Routine X 2) w Reflex to ID Panel     Status: None (Preliminary result)   Collection Time: 09/15/24 11:01 AM   Specimen: BLOOD  Result Value Ref Range Status   Specimen  Description   Final    BLOOD BLOOD RIGHT HAND Performed at [facility] [address], [place], [place] [address]    Special Requests   Final    BOTTLES DRAWN AEROBIC ONLY Blood Culture results may not be optimal due to an inadequate volume of blood received in culture bottles Performed at [facility], [address], [place], [place] [address]    Culture   Final    NO GROWTH 3 DAYS Performed at [facility], [address] [place], [place] [address]    Report Status PENDING  Incomplete     Labs: Basic Metabolic Panel: Recent Labs  Lab 09/13/24 0052 09/14/24 0535 09/16/24 0505 09/17/24 0830 09/18/24 0559  NA 137 138 143 138 138  K 3.0* 3.4* 3.7 3.8 4.1  CL 103 102 108 102 103  CO2 23 26 24 25 25   GLUCOSE 123* 120* 110* 94 124*  BUN 7* 8 16 8 12   CREATININE 0.56 0.54 0.66 0.61 0.76  CALCIUM 8.2* 8.1* 8.0* 8.3* 8.2*  MG 2.3 2.2  --  2.4  --   PHOS  --   --   --  3.7  --    Liver Function Tests: Recent Labs  Lab 09/12/24 0604 09/13/24 0052 09/17/24 0830  AST 50* 39  --   ALT 50* 35  --   ALKPHOS 105 103  --   BILITOT 0.5 0.4  --   PROT 6.0* 5.5*  --   ALBUMIN 3.1* 2.9* 3.1*   No results for input(s): LIPASE, AMYLASE in the last 168 hours. No results for input(s): AMMONIA in the last 168 hours. CBC: Recent Labs  Lab 09/12/24 0604 09/13/24 0052 09/17/24 0830 09/18/24 0559  WBC 8.2 9.3 12.7* 9.3  NEUTROABS 4.8 6.3 9.4* 6.7  HGB 12.7 11.0* 11.6* 11.3*  HCT 40.0 33.3* 34.3* 34.4*  MCV 88.7 85.4 84.9 85.6  PLT 144* 149* 217 230   Cardiac Enzymes: No results for input(s): CKTOTAL, CKMB, CKMBINDEX, TROPONINI in the last 168 hours. BNP: BNP (last 3 results) No results for input(s): BNP in the last 8760 hours.  ProBNP (last 3 results) No results for input(s): PROBNP in the  last 8760 hours.  CBG: No results for input(s): GLUCAP in the last 168 hours.     Signed:  Toribio Hummer MD.   [facility] 09/18/2024, 2:31 PM

## 2024-09-18 NOTE — TOC Transition Note (Addendum)
 Transition of Care [facility]) - Discharge Note   Patient Details  Name: Mary Mendez MRN: [id number] Date of Birth: May 17, 1961  Transition of Care [facility]) CM/SW Contact:  Bascom Service, RN Phone Number: 09/18/2024, 10:07 AM   Clinical Narrative: Beatris to Jamie(dtr) d/c plan home w/HHRN/home iv abx-[employer] rep Pam has already instructed Warren on initial teaching, supplies to be delivered, Await OPAT order to be signed. No HHC agency to provide home [facility] instruction. Pam w/[employer] will provide their own [facility] for teaching-Jamie dtr aware of HHRN that will be provided from [employer]-Pam [employer] will discuss directly with Warren cost. Family will have own transport home. Once PICC line placed,  [employer] has set up [facility], & OPAT order signed, no further CM needs.   -4:14p dtr Warren has been taught-dtr Jamie per [employer] rep Pam can provide iv abx infusion -HHRN will make visit on Tuesday, PICC flush, labs per protocal. No further CM needs.    Final next level of care: Home w Home Health Services Barriers to Discharge: No Barriers Identified   Patient Goals and CMS Choice Patient states their goals for this hospitalization and ongoing recovery are:: Home CMS Medicare.gov Compare Post Acute Care list provided to:: Patient Represenative (must comment) (Jamie(dtr)) Choice offered to / list presented to : Adult Children [place] ownership interest in [facility].provided to:: Adult Children    Discharge Placement                       Discharge Plan and Services Additional resources added to the After Visit Summary for     Discharge Planning Services: CM Consult Post Acute Care Choice: Home Health                    HH Arranged: RN, IV Antibiotics HH Agency: [employer] Date HH Agency Contacted: 09/18/24 Time HH Agency Contacted: 1007 Representative spoke with at [facility] Agency: Pam  Social Drivers of Health (SDOH) Interventions SDOH Screenings   Food Insecurity: No Food  Insecurity (09/11/2024)  Housing: Low Risk  (09/11/2024)  Transportation Needs: No Transportation Needs (09/11/2024)  Utilities: Not At Risk (09/11/2024)  Depression (PHQ2-9): Low Risk  (06/11/2024)  Social Connections: Unknown (09/11/2024)  Tobacco Use: Low Risk  (09/11/2024)     Readmission Risk Interventions     No data to display

## 2024-09-19 DIAGNOSIS — B9561 Methicillin susceptible Staphylococcus aureus infection as the cause of diseases classified elsewhere: Secondary | ICD-10-CM | POA: Diagnosis not present

## 2024-09-19 DIAGNOSIS — R7881 Bacteremia: Secondary | ICD-10-CM | POA: Diagnosis not present

## 2024-09-19 LAB — AEROBIC/ANAEROBIC CULTURE W GRAM STAIN (SURGICAL/DEEP WOUND): Gram Stain: NONE SEEN

## 2024-09-20 DIAGNOSIS — B9561 Methicillin susceptible Staphylococcus aureus infection as the cause of diseases classified elsewhere: Secondary | ICD-10-CM | POA: Diagnosis not present

## 2024-09-20 DIAGNOSIS — R7881 Bacteremia: Secondary | ICD-10-CM | POA: Diagnosis not present

## 2024-09-20 LAB — CULTURE, BLOOD (ROUTINE X 2)
Culture: NO GROWTH
Culture: NO GROWTH

## 2024-09-21 DIAGNOSIS — R7881 Bacteremia: Secondary | ICD-10-CM | POA: Diagnosis not present

## 2024-09-21 DIAGNOSIS — B9561 Methicillin susceptible Staphylococcus aureus infection as the cause of diseases classified elsewhere: Secondary | ICD-10-CM | POA: Diagnosis not present

## 2024-09-22 DIAGNOSIS — R7881 Bacteremia: Secondary | ICD-10-CM | POA: Diagnosis not present

## 2024-09-22 DIAGNOSIS — B9561 Methicillin susceptible Staphylococcus aureus infection as the cause of diseases classified elsewhere: Secondary | ICD-10-CM | POA: Diagnosis not present

## 2024-09-23 ENCOUNTER — Telehealth: Payer: Self-pay

## 2024-09-23 DIAGNOSIS — R7881 Bacteremia: Secondary | ICD-10-CM | POA: Diagnosis not present

## 2024-09-23 DIAGNOSIS — B9561 Methicillin susceptible Staphylococcus aureus infection as the cause of diseases classified elsewhere: Secondary | ICD-10-CM | POA: Diagnosis not present

## 2024-09-23 NOTE — Telephone Encounter (Signed)
 Called pt to discuss tomorrows appts as she was D/C from inpt 11/20 and is receiving IV abx at home via PICC. Pt states labs and PICC line dsg change was performed today and she provided [facility] nurse number [phone] to obtain those lab results. Appts for lab and infusion cx per MD d/t need for IV abx.   S/w Valery, RN who states she dropped pt's labs off today at [facility] around 1:30 PM.

## 2024-09-24 ENCOUNTER — Inpatient Hospital Stay

## 2024-09-24 ENCOUNTER — Inpatient Hospital Stay: Admitting: Hematology and Oncology

## 2024-09-24 VITALS — BP 156/88 | HR 94 | Temp 98.3°F | Resp 18 | Ht 65.0 in | Wt 220.9 lb

## 2024-09-24 DIAGNOSIS — R7881 Bacteremia: Secondary | ICD-10-CM

## 2024-09-24 DIAGNOSIS — Z5111 Encounter for antineoplastic chemotherapy: Secondary | ICD-10-CM | POA: Diagnosis not present

## 2024-09-24 DIAGNOSIS — R110 Nausea: Secondary | ICD-10-CM | POA: Diagnosis not present

## 2024-09-24 DIAGNOSIS — R432 Parageusia: Secondary | ICD-10-CM | POA: Diagnosis not present

## 2024-09-24 DIAGNOSIS — Z803 Family history of malignant neoplasm of breast: Secondary | ICD-10-CM | POA: Diagnosis not present

## 2024-09-24 DIAGNOSIS — Z5189 Encounter for other specified aftercare: Secondary | ICD-10-CM | POA: Diagnosis not present

## 2024-09-24 DIAGNOSIS — Z1721 Progesterone receptor positive status: Secondary | ICD-10-CM | POA: Diagnosis not present

## 2024-09-24 DIAGNOSIS — R21 Rash and other nonspecific skin eruption: Secondary | ICD-10-CM | POA: Diagnosis not present

## 2024-09-24 DIAGNOSIS — B9561 Methicillin susceptible Staphylococcus aureus infection as the cause of diseases classified elsewhere: Secondary | ICD-10-CM | POA: Diagnosis not present

## 2024-09-24 DIAGNOSIS — Z806 Family history of leukemia: Secondary | ICD-10-CM | POA: Diagnosis not present

## 2024-09-24 DIAGNOSIS — Z1732 Human epidermal growth factor receptor 2 negative status: Secondary | ICD-10-CM | POA: Diagnosis not present

## 2024-09-24 DIAGNOSIS — Z170 Estrogen receptor positive status [ER+]: Secondary | ICD-10-CM

## 2024-09-24 DIAGNOSIS — E860 Dehydration: Secondary | ICD-10-CM | POA: Diagnosis not present

## 2024-09-24 DIAGNOSIS — C50212 Malignant neoplasm of upper-inner quadrant of left female breast: Secondary | ICD-10-CM | POA: Diagnosis not present

## 2024-09-24 DIAGNOSIS — Z90710 Acquired absence of both cervix and uterus: Secondary | ICD-10-CM | POA: Diagnosis not present

## 2024-09-24 DIAGNOSIS — R609 Edema, unspecified: Secondary | ICD-10-CM | POA: Diagnosis not present

## 2024-09-24 NOTE — Assessment & Plan Note (Signed)
 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 Oncotype DX score 46, high recurrence risk. Chemotherapy recommended to reduce metastasis risk. She got one cycle of TC and is now admitted  Assessment & Plan Port-related bloodstream infection due to methicillin susceptible Staphylococcus aureus (post-port removal, on intravenous antibiotics) Post-port removal infection due to methicillin susceptible Staphylococcus aureus. Currently on intravenous antibiotics at home. No blood clot detected on ultrasound. - Continue intravenous antibiotics until December 4th. - Ensure communication with the nursing team regarding follow-up and results.  Chemotherapy for malignancy (delayed due to infection) Chemotherapy delayed due to ongoing antibiotic treatment for port-related infection. Plan to resume chemotherapy after completion of antibiotics to ensure adequate recovery. - Delayed chemotherapy until after completion of antibiotics on December 4th. - Scheduled follow-up appointment on December 11th to assess recovery and perform blood work.  Antibiotic-related rash and nausea Rash and nausea likely related to antibiotic use. Rash has improved and is not currently itchy. Nausea persists but is likely due to antibiotics. - Monitor rash and nausea symptoms. - Will consider Kenalog for rash if symptoms recur after next chemotherapy treatment.  Fatigue and mild dehydration post-hospitalization Fatigue and mild dehydration following recent hospitalization for infection and bronchitis. Blood counts are stable, and she is not immunocompromised. - Encouraged increased water intake and protein consumption. - Advised against excessive coffee consumption.  Dysgeusia (altered taste) Altered taste described as saltiness, likely related to recent illness  and treatment. - Encouraged trying different tastes to help reset taste buds.

## 2024-09-24 NOTE — Progress Notes (Signed)
 [facility] CONSULT NOTE  Patient Care Team: [name], MD as PCP - General (Family Medicine) Tyree Nanetta SAILOR, RN as Oncology Nurse Navigator Gerome, Devere HERO, RN as Oncology Nurse Navigator Vernetta Berg, MD as Consulting Physician (General Surgery) Loretha Ash, MD as Consulting Physician (Hematology and Oncology) Izell Domino, MD as Attending Physician (Radiation Oncology)  CHIEF COMPLAINTS/PURPOSE OF CONSULTATION:  Newly diagnosed breast cancer  HISTORY OF PRESENTING ILLNESS:  Mary Mendez 63 y.o. female is here because of recent diagnosis of left breast IDC  I reviewed her records extensively and collaborated the history with the patient.  SUMMARY OF ONCOLOGIC HISTORY: Oncology History  Malignant neoplasm of upper-inner quadrant of left breast in female, estrogen receptor positive (HCC)  06/03/2024 Pathology Results   Breast, left, needle core biopsy, mass with calcifications 5mm upper inner quadrant (ribbon clip) :      INVASIVE MAMMARY CARCINOMA      TUBULE FORMATION: SCORE 3      NUCLEAR PLEOMORPHISM: SCORE 2      MITOTIC COUNT: SCORE 1      TOTAL SCORE: 6      OVERALL GRADE: 2      LYMPHOVASCULAR INVASION: NOT IDENTIFIED      CANCER LENGTH: 5.5 MM      CALCIFICATIONS: PRESENT      OTHER FINDINGS: NONE      SEE NOTE       2. Breast, left, needle core biopsy, 2.5cm group upper inner quadrant (x clip) :      DUCTAL CARCINOMA IN SITU, INTERMEDIATE GRADE, SOLID TYPE      NECROSIS: PRESENT      CALCIFICATIONS: PRESENT      DCIS LENGTH: 1.0 CM / 10 MM      SEE NOTE  IMMUNOHISTOCHEMICAL AND MORPHOMETRIC ANALYSIS PERFORMED MANUALLY The tumor cells are NEGATIVE for Her2 (0). Estrogen Receptor:  100%, POSITIVE, STRONG STAINING INTENSITY Progesterone Receptor:  20%, POSITIVE, MODERATE STAINING INTENSITY Proliferation Marker Ki67:  60%     06/04/2024 Mammogram   Suspicious findings. The suggested screening mammographic asymmetry is  consistent with a small irregular mass/focal asymmetry with a few associated calcifications projecting in the upper inner left breast measuring 5 mm mammographically. This may correspond to the hypoechoic mass seen on sonography at 10 o'clock, 8 cm the nipple or to the second sonographic abnormality, at 10:30 o'clock, 10 cm from the nipple. This latter finding is smaller and more subtle. Given the uncertainty of which sonographic finding may be the correlate to the mammographic abnormality, stereotactic biopsy of the mammographic finding is recommended.  2.5 cm group of suspicious calcifications in the medial left breast, anterior to the above described small irregular mass/focal asymmetry. Stereotactic core needle biopsy is recommended.   06/09/2024 Initial Diagnosis   Malignant neoplasm of upper-inner quadrant of left breast in female, estrogen receptor positive (HCC)   06/11/2024 Cancer Staging   Staging form: Breast, AJCC 8th Edition - Clinical stage from 06/11/2024: Stage IA (cT1b, cN0, cM0, G2, ER+, PR+, HER2-) - Signed by Loretha Ash, MD on 06/11/2024 Stage prefix: Initial diagnosis Histologic grading system: 3 grade system Laterality: Left Staged by: Pathologist and managing physician Stage used in treatment planning: Yes National guidelines used in treatment planning: Yes Type of national guideline used in treatment planning: NCCN    Genetic Testing   [facility] CancerNext-Expanded Panel+RNA was Negative. 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 Report date is 06/18/2024.   The  CancerNext-Expanded gene panel offered by [employer] and includes sequencing, rearrangement, and RNA analysis for the following 77 genes: AIP, ALK, APC, ATM, AXIN2, BAP1, BARD1, BMPR1A, BRCA1, BRCA2, BRIP1, CDC73, CDH1, CDK4, CDKN1B, CDKN2A, CEBPA, CHEK2, CTNNA1, DDX41, DICER1, ETV6, FH, FLCN, GATA2, LZTR1, MAX, MBD4, MEN1, MET, MLH1, MSH2, MSH3, MSH6, MUTYH,  NF1, NF2, NTHL1, PALB2, PHOX2B, PMS2, POT1, PRKAR1A, PTCH1, PTEN, RAD51C, RAD51D, RB1, RET, RPS20, RUNX1, SDHA, SDHAF2, SDHB, SDHC, SDHD, SMAD4, SMARCA4, SMARCB1, SMARCE1, STK11, SUFU, TMEM127, TP53, TSC1, TSC2, VHL, and WT1 (sequencing and deletion/duplication); EGFR, HOXB13, KIT, MITF, PDGFRA, POLD1, and POLE (sequencing only); EPCAM and GREM1 (deletion/duplication only).     09/04/2024 -  Chemotherapy   Patient is on Treatment Plan : BREAST TC q21d       Discussed the use of AI scribe software for clinical note transcription with the patient, who gave verbal consent to proceed.  History of Present Illness  Mary Mendez is a 63 year old female with breast cancer who presents after recent hospitalization  She was hospitalized due to an infection at her port site, identified as a methicillin-sensitive Staphylococcus aureus infection. She was discharged with intravenous antibiotics at home, which started last Thursday night. Prior to hospitalization, she experienced irritation, redness, and swelling at the port site. An ultrasound was performed to check for a blood clot, which was negative. She did not receive a follow-up call regarding the ultrasound results, leading to frustration and concern about the lack of communication.  During her hospital stay, she developed bronchitis, which she attributes to exposure from a nurse who was also ill. She felt extremely unwell during this period, describing it as 'the craziest week' and expressing concern about her recovery.  Since returning home, she has experienced a rash that began after her hospital discharge, which she suspects may be related to the antibiotics. The rash was initially itchy but has improved with the use of hydrocortisone and other creams. She also reports persistent nausea, which she associates with the antibiotics.  She has not experienced any significant fevers since returning home, with the highest recorded temperature being  99.59F. However, she feels exhausted and notes significant fatigue after minimal physical activity, such as walking for five minutes.  Her social history includes plans to host Thanksgiving at her home with approximately fifteen people. She also mentions a concern about the taste of food, noting that everything tastes 'like pure salt', which affects her appetite.   MEDICAL HISTORY:  Past Medical History:  Diagnosis Date   Anxiety    Asthma    Bronchitis    Cancer (HCC) 06/2024   Left breast IDC   COVID-19 08/2021   Essential hypertension 09/11/2024   Hyperlipidemia    Hypertension    Mild intermittent asthma 09/11/2024   Pneumonia     SURGICAL HISTORY: Past Surgical History:  Procedure Laterality Date   ABDOMINAL HYSTERECTOMY  2010   and left ovaries;   BREAST BIOPSY     BREAST BIOPSY Left 06/04/2024   MM LT BREAST BX W LOC DEV 1ST LESION IMAGE BX SPEC STEREO GUIDE 06/04/2024 [facility]   BREAST BIOPSY Left 06/04/2024   MM LT BREAST BX W LOC DEV EA AD LESION IMG BX SPEC STEREO GUIDE 06/04/2024 [facility]   BREAST BIOPSY  07/15/2024   MM LT RADIOACTIVE SEED LOC MAMMO GUIDE 07/15/2024 [facility]   BREAST BIOPSY  07/15/2024   MM LT RADIOACTIVE SEED EA ADD LESION LOC MAMMO GUIDE 07/15/2024 [facility]   BREAST  BIOPSY  07/15/2024   MM LT RADIOACTIVE SEED EA ADD LESION LOC MAMMO GUIDE 07/15/2024 [facility]   BREAST BIOPSY  07/15/2024   MM LT RADIOACTIVE SEED EA ADD LESION LOC MAMMO GUIDE 07/15/2024 [facility]   BREAST EXCISIONAL BIOPSY Left    BREAST LUMPECTOMY WITH RADIOACTIVE SEED AND SENTINEL LYMPH NODE BIOPSY Left 07/17/2024   Procedure: BREAST LUMPECTOMY WITH RADIOACTIVE SEED AND SENTINEL LYMPH NODE BIOPSY;  Surgeon: Vernetta Berg, MD;  Location: [facility];  Service: General;  Laterality: Left;  RADIOACTIVE SEED GUIDED LEFT BREAST BRACKETED LUMPECTOMY AND SENTINEL NODE BIOPSY   BREAST REDUCTION SURGERY Bilateral 07/25/2024   Procedure:  MAMMOPLASTY, REDUCTION;  Surgeon: Arelia Filippo, MD;  Location: [facility];  Service: Plastics;  Laterality: Bilateral;   BREAST SURGERY     CHOLECYSTECTOMY N/A 02/18/2022   Procedure: LAPAROSCOPIC CHOLECYSTECTOMY;  Surgeon: Signe Mitzie LABOR, MD;  Location: [facility];  Service: General;  Laterality: N/A;   EXCISION, MASS, UPPER EXTREMITY Left 07/17/2024   Procedure: EXCISION, MASS, UPPER EXTREMITY;  Surgeon: Vernetta Berg, MD;  Location: [facility];  Service: General;  Laterality: Left;  EXCISION LEFT SHOULDER MASS   HYSTEROTOMY     PORT-A-CATH REMOVAL N/A 09/14/2024   Procedure: REMOVAL PORT-A-CATH;  Surgeon: Belinda Cough, MD;  Location: [facility];  Service: General;  Laterality: N/A;   PORTACATH PLACEMENT N/A 08/18/2024   Procedure: INSERTION, TUNNELED CENTRAL VENOUS DEVICE, WITH PORT;  Surgeon: Vernetta Berg, MD;  Location: [facility];  Service: General;  Laterality: N/A;  PORT PLACEMENT WITH ULTRASOUND GUIDANCE   TRANSESOPHAGEAL ECHOCARDIOGRAM (CATH LAB) N/A 09/16/2024   Procedure: TRANSESOPHAGEAL ECHOCARDIOGRAM;  Surgeon: Shlomo Wilbert SAUNDERS, MD;  Location: [facility];  Service: Cardiovascular;  Laterality: N/A;    SOCIAL HISTORY: Social History   Socioeconomic History   Marital status: Married    Spouse name: Not on file   Number of children: 2   Years of education: Not on file   Highest education level: Not on file  Occupational History   Occupation: Self Employed  Tobacco Use   Smoking status: Never    Passive exposure: Never   Smokeless tobacco: Never  Vaping Use   Vaping status: Never Used  Substance and Sexual Activity   Alcohol use: Yes    Comment: social   Drug use: Never   Sexual activity: Not Currently    Birth control/protection: Surgical    Comment: hyst  Other Topics Concern   Not on file  Social History Narrative   Not on file   Social Drivers of Health   Financial Resource Strain: Not on file  Food Insecurity: No  Food Insecurity (09/11/2024)   Hunger Vital Sign    Worried About Running Out of Food in the Last Year: Never true    Ran Out of Food in the Last Year: Never true  Transportation Needs: No Transportation Needs (09/11/2024)   PRAPARE - Administrator, Civil Service (Medical): No    Lack of Transportation (Non-Medical): No  Physical Activity: Not on file  Stress: Not on file  Social Connections: Unknown (09/11/2024)   Social Connection and Isolation Panel    Frequency of Communication with Friends and Family: More than three times a week    Frequency of Social Gatherings with Friends and Family: More than three times a week    Attends Religious Services: Patient declined    Database Administrator or Organizations: Patient declined    Attends Banker  Meetings: Patient declined    Marital Status: Married  Catering Manager Violence: Not At Risk (09/11/2024)   Humiliation, Afraid, Rape, and Kick questionnaire    Fear of Current or Ex-Partner: No    Emotionally Abused: No    Physically Abused: No    Sexually Abused: No    FAMILY HISTORY: Family History  Problem Relation Age of Onset   Breast cancer Mother 51   Emphysema Father    Stroke Father    Stroke Sister    Asthma Sister    Leukemia Maternal Aunt 35 - 79   Asthma Paternal Uncle    Colon cancer Neg Hx    Colon polyps Neg Hx    Esophageal cancer Neg Hx    Rectal cancer Neg Hx    Stomach cancer Neg Hx     ALLERGIES:  has no known allergies.  MEDICATIONS:  Current Outpatient Medications  Medication Sig Dispense Refill   acetaminophen  (TYLENOL ) 500 MG tablet Take 1,000 mg by mouth every 6 (six) hours as needed for mild pain.     albuterol  (VENTOLIN  HFA) 108 (90 Base) MCG/ACT inhaler Inhale into the lungs.     amLODipine  (NORVASC ) 10 MG tablet Take 10 mg by mouth daily.     busPIRone  (BUSPAR ) 10 MG tablet Take 10 mg by mouth 3 (three) times daily as needed (Anxiety).     ceFAZolin  (ANCEF ) IVPB  Inject 2 g into the vein every 8 (eight) hours for 10 days. Indication:  MSSA bacteremia First Dose: Yes Last Day of Therapy:  09/27/24 Labs - Once weekly:  CBC/D and BMP, Labs - Once weekly: ESR and CRP Method of administration: IV Push Method of administration may be changed at the discretion of home infusion pharmacist based upon assessment of the patient and/or caregiver's ability to self-administer the medication ordered. 30 Units 0   dexamethasone  (DECADRON ) 4 MG tablet Take 2 tabs by mouth 2 times daily starting day before chemo. Then take 2 tabs daily for 2 days starting day after chemo. Take with food. 30 tablet 1   fluticasone -salmeterol (ADVAIR) 250-50 MCG/ACT AEPB Inhale 1 puff into the lungs 2 (two) times daily.     guaiFENesin -dextromethorphan  (ROBITUSSIN DM) 100-10 MG/5ML syrup Take 5 mLs by mouth every 4 (four) hours as needed for cough.     lidocaine -prilocaine  (EMLA ) cream Apply to affected area once 30 g 3   loratadine  (CLARITIN ) 10 MG tablet Take 10 mg by mouth daily.     montelukast  (SINGULAIR ) 10 MG tablet Take 10 mg by mouth daily.     nystatin  (MYCOSTATIN /NYSTOP ) powder Apply 1 Application topically 3 (three) times daily. 15 g 0   ondansetron  (ZOFRAN ) 8 MG tablet Take 1 tablet (8 mg total) by mouth every 8 (eight) hours as needed for nausea or vomiting. Start on the third day after chemotherapy. 30 tablet 1   prochlorperazine  (COMPAZINE ) 10 MG tablet Take 1 tablet (10 mg total) by mouth every 6 (six) hours as needed for nausea or vomiting. 30 tablet 1   rosuvastatin  (CRESTOR ) 10 MG tablet Take 10 mg by mouth daily.     tretinoin  (RETIN-A ) 0.1 % cream Apply 1 application  topically at bedtime.     zolpidem  (AMBIEN ) 5 MG tablet Take 1 tablet (5 mg total) by mouth at bedtime as needed for sleep. 10 tablet 0   No current facility-administered medications for this visit.   ECOG PERFORMANCE STATUS: 0 - Asymptomatic  Vitals:   09/24/24 0908 09/24/24 0909  BP: ROLLEN)  162/82 (!)  156/88  Pulse: 94   Resp: 18   Temp: 98.3 F (36.8 C)   SpO2: 97%      Filed Weights   09/24/24 0908  Weight: 220 lb 14.4 oz (100.2 kg)      GENERAL:alert, no distress and comfortable Port site in dressing.   LABORATORY DATA:  I have reviewed the data as listed Lab Results  Component Value Date   WBC 9.3 09/18/2024   HGB 11.3 (L) 09/18/2024   HCT 34.4 (L) 09/18/2024   MCV 85.6 09/18/2024   PLT 230 09/18/2024   Lab Results  Component Value Date   NA 138 09/18/2024   K 4.1 09/18/2024   CL 103 09/18/2024   CO2 25 09/18/2024    RADIOGRAPHIC STUDIES: I have personally reviewed the radiological reports and agreed with the findings in the report.  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 Oncotype DX score 46, high recurrence risk. Chemotherapy recommended to reduce metastasis risk. She got one cycle of TC and is now admitted  Assessment & Plan Port-related bloodstream infection due to methicillin susceptible Staphylococcus aureus (post-port removal, on intravenous antibiotics) Post-port removal infection due to methicillin susceptible Staphylococcus aureus. Currently on intravenous antibiotics at home. No blood clot detected on ultrasound. - Continue intravenous antibiotics until December 4th. - Ensure communication with the nursing team regarding follow-up and results.  Chemotherapy for malignancy (delayed due to infection) Chemotherapy delayed due to ongoing antibiotic treatment for port-related infection. Plan to resume chemotherapy after completion of antibiotics to ensure adequate recovery. - Delayed chemotherapy until after completion of antibiotics on December 4th. -  Scheduled follow-up appointment on December 11th to assess recovery and perform blood work.  Antibiotic-related rash and nausea Rash and nausea likely related to antibiotic use. Rash has improved and is not currently itchy. Nausea persists but is likely due to antibiotics. - Monitor rash and nausea symptoms. - Will consider Kenalog for rash if symptoms recur after next chemotherapy treatment.  Fatigue and mild dehydration post-hospitalization Fatigue and mild dehydration following recent hospitalization for infection and bronchitis. Blood counts are stable, and she is not immunocompromised. - Encouraged increased water intake and protein consumption. - Advised against excessive coffee consumption.  Dysgeusia (altered taste) Altered taste described as saltiness, likely related to recent illness and treatment. - Encouraged trying different tastes to help reset taste buds.      Time spent: 40 min including H and P, review of records, counseling and coordination of care. All questions were answered. The patient knows to call the clinic with any problems, questions or concerns.    Amber Stalls, MD 09/24/24

## 2024-09-25 DIAGNOSIS — R7881 Bacteremia: Secondary | ICD-10-CM | POA: Diagnosis not present

## 2024-09-25 DIAGNOSIS — B9561 Methicillin susceptible Staphylococcus aureus infection as the cause of diseases classified elsewhere: Secondary | ICD-10-CM | POA: Diagnosis not present

## 2024-09-26 ENCOUNTER — Inpatient Hospital Stay

## 2024-09-26 DIAGNOSIS — B9561 Methicillin susceptible Staphylococcus aureus infection as the cause of diseases classified elsewhere: Secondary | ICD-10-CM | POA: Diagnosis not present

## 2024-09-26 DIAGNOSIS — R7881 Bacteremia: Secondary | ICD-10-CM | POA: Diagnosis not present

## 2024-09-27 DIAGNOSIS — R7881 Bacteremia: Secondary | ICD-10-CM | POA: Diagnosis not present

## 2024-09-27 DIAGNOSIS — B9561 Methicillin susceptible Staphylococcus aureus infection as the cause of diseases classified elsewhere: Secondary | ICD-10-CM | POA: Diagnosis not present

## 2024-09-29 ENCOUNTER — Telehealth: Payer: Self-pay

## 2024-09-29 DIAGNOSIS — R7881 Bacteremia: Secondary | ICD-10-CM | POA: Diagnosis not present

## 2024-09-29 NOTE — Telephone Encounter (Signed)
 Patient called with questions regarding picc line removal. Was told by [facility] that today would be last visit unless orders are sent to extend visits. Per opat pt should have completed antbx around 11/29 and line could be removed after last dose. Kennley mentioned that oncology would like to keep line in to finish last three doses of chemo.  Informed pt that she will need to reach out to oncology and ask they send orders to [facility] if they plan to extend picc line orders.  Will have oncology reach out to Dr. Dennise. Lorenda CHRISTELLA Code, RMA

## 2024-09-30 ENCOUNTER — Other Ambulatory Visit: Payer: Self-pay | Admitting: Surgery

## 2024-10-02 ENCOUNTER — Telehealth: Payer: Self-pay

## 2024-10-02 NOTE — Telephone Encounter (Signed)
 Late Entry:  Spoke with pt Tuesday 12/2. She states she is finished with her home IV abx. Dr Loretha wishes to keep PICC for ongoing treatment. Advised pt that from here, we will manage her PICC. There is no need for her to flush her PICC at home, as we will do this when she comes in for weekly dsg changes.   She is agreeable to this. Pt appt made for PICC line dsg change and labs 10/06/24. She is agreeable.  Message sent to scheduling to reflect this moving forward.

## 2024-10-06 ENCOUNTER — Inpatient Hospital Stay: Attending: Hematology and Oncology

## 2024-10-06 ENCOUNTER — Inpatient Hospital Stay

## 2024-10-06 DIAGNOSIS — Z79899 Other long term (current) drug therapy: Secondary | ICD-10-CM | POA: Diagnosis not present

## 2024-10-06 DIAGNOSIS — Z5189 Encounter for other specified aftercare: Secondary | ICD-10-CM | POA: Diagnosis not present

## 2024-10-06 DIAGNOSIS — R110 Nausea: Secondary | ICD-10-CM | POA: Diagnosis not present

## 2024-10-06 DIAGNOSIS — Z1732 Human epidermal growth factor receptor 2 negative status: Secondary | ICD-10-CM | POA: Insufficient documentation

## 2024-10-06 DIAGNOSIS — T451X5A Adverse effect of antineoplastic and immunosuppressive drugs, initial encounter: Secondary | ICD-10-CM | POA: Insufficient documentation

## 2024-10-06 DIAGNOSIS — Z1721 Progesterone receptor positive status: Secondary | ICD-10-CM | POA: Diagnosis not present

## 2024-10-06 DIAGNOSIS — Z803 Family history of malignant neoplasm of breast: Secondary | ICD-10-CM | POA: Insufficient documentation

## 2024-10-06 DIAGNOSIS — Z806 Family history of leukemia: Secondary | ICD-10-CM | POA: Diagnosis not present

## 2024-10-06 DIAGNOSIS — Z170 Estrogen receptor positive status [ER+]: Secondary | ICD-10-CM | POA: Insufficient documentation

## 2024-10-06 DIAGNOSIS — Z90710 Acquired absence of both cervix and uterus: Secondary | ICD-10-CM | POA: Insufficient documentation

## 2024-10-06 DIAGNOSIS — I10 Essential (primary) hypertension: Secondary | ICD-10-CM | POA: Diagnosis not present

## 2024-10-06 DIAGNOSIS — C50212 Malignant neoplasm of upper-inner quadrant of left female breast: Secondary | ICD-10-CM | POA: Diagnosis not present

## 2024-10-06 DIAGNOSIS — Z5111 Encounter for antineoplastic chemotherapy: Secondary | ICD-10-CM | POA: Diagnosis present

## 2024-10-08 DIAGNOSIS — Z170 Estrogen receptor positive status [ER+]: Secondary | ICD-10-CM | POA: Diagnosis not present

## 2024-10-08 DIAGNOSIS — Z9889 Other specified postprocedural states: Secondary | ICD-10-CM | POA: Diagnosis not present

## 2024-10-08 DIAGNOSIS — C50212 Malignant neoplasm of upper-inner quadrant of left female breast: Secondary | ICD-10-CM | POA: Diagnosis not present

## 2024-10-10 ENCOUNTER — Inpatient Hospital Stay

## 2024-10-10 ENCOUNTER — Other Ambulatory Visit: Payer: Self-pay

## 2024-10-10 ENCOUNTER — Inpatient Hospital Stay: Admitting: Hematology and Oncology

## 2024-10-10 DIAGNOSIS — Z5111 Encounter for antineoplastic chemotherapy: Secondary | ICD-10-CM | POA: Diagnosis not present

## 2024-10-10 DIAGNOSIS — Z170 Estrogen receptor positive status [ER+]: Secondary | ICD-10-CM | POA: Diagnosis not present

## 2024-10-10 DIAGNOSIS — C50212 Malignant neoplasm of upper-inner quadrant of left female breast: Secondary | ICD-10-CM

## 2024-10-10 DIAGNOSIS — Z95828 Presence of other vascular implants and grafts: Secondary | ICD-10-CM

## 2024-10-10 LAB — CMP (CANCER CENTER ONLY)
ALT: 10 U/L (ref 0–44)
AST: 19 U/L (ref 15–41)
Albumin: 4.3 g/dL (ref 3.5–5.0)
Alkaline Phosphatase: 83 U/L (ref 38–126)
Anion gap: 12 (ref 5–15)
BUN: 21 mg/dL (ref 8–23)
CO2: 22 mmol/L (ref 22–32)
Calcium: 9.2 mg/dL (ref 8.9–10.3)
Chloride: 105 mmol/L (ref 98–111)
Creatinine: 0.87 mg/dL (ref 0.44–1.00)
GFR, Estimated: >60 mL/min (ref >60)
Glucose, Bld: 153 mg/dL — ABNORMAL HIGH (ref 70–99)
Potassium: 4 mmol/L (ref 3.5–5.1)
Sodium: 138 mmol/L (ref 135–145)
Total Bilirubin: 0.4 mg/dL (ref 0.0–1.2)
Total Protein: 8.1 g/dL (ref 6.5–8.1)

## 2024-10-10 LAB — CBC WITH DIFFERENTIAL (CANCER CENTER ONLY)
Abs Immature Granulocytes: 0.12 K/uL — ABNORMAL HIGH (ref 0.00–0.07)
Basophils Absolute: 0 K/uL (ref 0.0–0.1)
Basophils Relative: 0 %
Eosinophils Absolute: 0 K/uL (ref 0.0–0.5)
Eosinophils Relative: 0 %
HCT: 29.4 % — ABNORMAL LOW (ref 36.0–46.0)
Hemoglobin: 10.1 g/dL — ABNORMAL LOW (ref 12.0–15.0)
Immature Granulocytes: 1 %
Lymphocytes Relative: 12 %
Lymphs Abs: 1.5 K/uL (ref 0.7–4.0)
MCH: 28.2 pg (ref 26.0–34.0)
MCHC: 34.4 g/dL (ref 30.0–36.0)
MCV: 82.1 fL (ref 80.0–100.0)
Monocytes Absolute: 0.3 K/uL (ref 0.1–1.0)
Monocytes Relative: 2 %
Neutro Abs: 10 K/uL — ABNORMAL HIGH (ref 1.7–7.7)
Neutrophils Relative %: 85 %
Platelet Count: 264 K/uL (ref 150–400)
RBC: 3.58 MIL/uL — ABNORMAL LOW (ref 3.87–5.11)
RDW: 15.9 % — ABNORMAL HIGH (ref 11.5–15.5)
WBC Count: 11.9 K/uL — ABNORMAL HIGH (ref 4.0–10.5)
nRBC: 0 % (ref 0.0–0.2)

## 2024-10-10 MED ORDER — SODIUM CHLORIDE 0.9 % IV SOLN
75.0000 mg/m2 | Freq: Once | INTRAVENOUS | Status: AC
  Administered 2024-10-10: 160 mg via INTRAVENOUS
  Filled 2024-10-10: qty 16

## 2024-10-10 MED ORDER — SODIUM CHLORIDE 0.9 % IV SOLN: INTRAVENOUS | Status: DC

## 2024-10-10 MED ORDER — PALONOSETRON HCL INJECTION 0.25 MG/5ML
0.2500 mg | Freq: Once | INTRAVENOUS | Status: AC
  Administered 2024-10-10: 0.25 mg via INTRAVENOUS
  Filled 2024-10-10: qty 5

## 2024-10-10 MED ORDER — OXYCODONE HCL 5 MG PO TABS: 5.0000 mg | ORAL_TABLET | Freq: Two times a day (BID) | ORAL | 0 refills | Status: AC | PRN

## 2024-10-10 MED ORDER — SODIUM CHLORIDE 0.9 % IV SOLN
600.0000 mg/m2 | Freq: Once | INTRAVENOUS | Status: AC
  Administered 2024-10-10: 1280 mg via INTRAVENOUS
  Filled 2024-10-10: qty 64

## 2024-10-10 MED ORDER — PROCHLORPERAZINE MALEATE 10 MG PO TABS: 10.0000 mg | ORAL_TABLET | Freq: Four times a day (QID) | ORAL | 1 refills | Status: AC | PRN

## 2024-10-10 MED ORDER — DEXAMETHASONE SOD PHOSPHATE PF 10 MG/ML IJ SOLN
10.0000 mg | Freq: Once | INTRAMUSCULAR | Status: AC
  Administered 2024-10-10: 10 mg via INTRAVENOUS

## 2024-10-10 NOTE — Patient Instructions (Signed)
 [facility] - A DEPT OF [place] [facility]  Discharge Instructions: Thank you for choosing [facility] to provide your oncology and hematology care.   If you have a lab appointment with the Cancer Center, please go directly to the Cancer Center and check in at the registration area.   Wear comfortable clothing and clothing appropriate for easy access to any Portacath or PICC line.   We strive to give you quality time with your provider. You may need to reschedule your appointment if you arrive late (15 or more minutes).  Arriving late affects you and other patients whose appointments are after yours.  Also, if you miss three or more appointments without notifying the office, you may be dismissed from the clinic at the providers discretion.      For prescription refill requests, have your pharmacy contact our office and allow 72 hours for refills to be completed.    Today you received the following chemotherapy and/or immunotherapy agents taxotere  cytoxan        To help prevent nausea and vomiting after your treatment, we encourage you to take your nausea medication as directed.  BELOW ARE SYMPTOMS THAT SHOULD BE REPORTED IMMEDIATELY: *FEVER GREATER THAN 100.4 F (38 C) OR HIGHER *CHILLS OR SWEATING *NAUSEA AND VOMITING THAT IS NOT CONTROLLED WITH YOUR NAUSEA MEDICATION *UNUSUAL SHORTNESS OF BREATH *UNUSUAL BRUISING OR BLEEDING *URINARY PROBLEMS (pain or burning when urinating, or frequent urination) *BOWEL PROBLEMS (unusual diarrhea, constipation, pain near the anus) TENDERNESS IN MOUTH AND THROAT WITH OR WITHOUT PRESENCE OF ULCERS (sore throat, sores in mouth, or a toothache) UNUSUAL RASH, SWELLING OR PAIN  UNUSUAL VAGINAL DISCHARGE OR ITCHING   Items with * indicate a potential emergency and should be followed up as soon as possible or go to the Emergency Department if any problems should occur.  Please show the CHEMOTHERAPY ALERT CARD or  IMMUNOTHERAPY ALERT CARD at check-in to the Emergency Department and triage nurse.  Should you have questions after your visit or need to cancel or reschedule your appointment, please contact [facility] - A DEPT OF [facility]  Dept: [phone]  and follow the prompts.  Office hours are 8:00 a.m. to 4:30 p.m. Monday - Friday. Please note that voicemails left after 4:00 p.m. may not be returned until the following business day.  We are closed weekends and major holidays. You have access to a nurse at all times for urgent questions. Please call the main number to the clinic Dept: [phone] and follow the prompts.   For any non-urgent questions, you may also contact your provider using MyChart. We now offer e-Visits for anyone 51 and older to request care online for non-urgent symptoms. For details visit [email or web address].   Also download the MyChart app! Go to the app store, search MyChart, open the app, select [place], and log in with your MyChart username and password.

## 2024-10-10 NOTE — Assessment & Plan Note (Signed)
 Disease status: No Evidence of Disease Evidence: Pathology and Physical Exam  Assessment and Plan Assessment & Plan

## 2024-10-10 NOTE — Progress Notes (Signed)
  Cancer Center CONSULT NOTE  Patient Care Team: [name], MD as PCP - General (Family Medicine) Tyree Nanetta SAILOR, RN as Oncology Nurse Navigator Gerome, Devere HERO, RN as Oncology Nurse Navigator Vernetta Berg, MD as Consulting Physician (General Surgery) Loretha Ash, MD as Consulting Physician (Hematology and Oncology) Izell Domino, MD as Attending Physician (Radiation Oncology)  CHIEF COMPLAINTS/PURPOSE OF CONSULTATION:  Newly diagnosed breast cancer  HISTORY OF PRESENTING ILLNESS:  Mary Mendez 63 y.o. female is here because of recent diagnosis of left breast IDC  I reviewed her records extensively and collaborated the history with the patient.  SUMMARY OF ONCOLOGIC HISTORY: Oncology History  Malignant neoplasm of upper-inner quadrant of left breast in female, estrogen receptor positive (HCC)  06/03/2024 Pathology Results   Breast, left, needle core biopsy, mass with calcifications 5mm upper inner quadrant (ribbon clip) :      INVASIVE MAMMARY CARCINOMA      TUBULE FORMATION: SCORE 3      NUCLEAR PLEOMORPHISM: SCORE 2      MITOTIC COUNT: SCORE 1      TOTAL SCORE: 6      OVERALL GRADE: 2      LYMPHOVASCULAR INVASION: NOT IDENTIFIED      CANCER LENGTH: 5.5 MM      CALCIFICATIONS: PRESENT      OTHER FINDINGS: NONE      SEE NOTE       2. Breast, left, needle core biopsy, 2.5cm group upper inner quadrant (x clip) :      DUCTAL CARCINOMA IN SITU, INTERMEDIATE GRADE, SOLID TYPE      NECROSIS: PRESENT      CALCIFICATIONS: PRESENT      DCIS LENGTH: 1.0 CM / 10 MM      SEE NOTE  IMMUNOHISTOCHEMICAL AND MORPHOMETRIC ANALYSIS PERFORMED MANUALLY The tumor cells are NEGATIVE for Her2 (0). Estrogen Receptor:  100%, POSITIVE, STRONG STAINING INTENSITY Progesterone Receptor:  20%, POSITIVE, MODERATE STAINING INTENSITY Proliferation Marker Ki67:  60%     06/04/2024 Mammogram   Suspicious findings. The suggested screening mammographic asymmetry is  consistent with a small irregular mass/focal asymmetry with a few associated calcifications projecting in the upper inner left breast measuring 5 mm mammographically. This may correspond to the hypoechoic mass seen on sonography at 10 o'clock, 8 cm the nipple or to the second sonographic abnormality, at 10:30 o'clock, 10 cm from the nipple. This latter finding is smaller and more subtle. Given the uncertainty of which sonographic finding may be the correlate to the mammographic abnormality, stereotactic biopsy of the mammographic finding is recommended.  2.5 cm group of suspicious calcifications in the medial left breast, anterior to the above described small irregular mass/focal asymmetry. Stereotactic core needle biopsy is recommended.   06/09/2024 Initial Diagnosis   Malignant neoplasm of upper-inner quadrant of left breast in female, estrogen receptor positive (HCC)   06/11/2024 Cancer Staging   Staging form: Breast, AJCC 8th Edition - Clinical stage from 06/11/2024: Stage IA (cT1b, cN0, cM0, G2, ER+, PR+, HER2-) - Signed by Loretha Ash, MD on 06/11/2024 Stage prefix: Initial diagnosis Histologic grading system: 3 grade system Laterality: Left Staged by: Pathologist and managing physician Stage used in treatment planning: Yes National guidelines used in treatment planning: Yes Type of national guideline used in treatment planning: NCCN    Genetic Testing   [facility] CancerNext-Expanded Panel+RNA was Negative. 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 Report date is 06/18/2024.   The  CancerNext-Expanded gene panel offered by [employer] and includes sequencing, rearrangement, and RNA analysis for the following 77 genes: AIP, ALK, APC, ATM, AXIN2, BAP1, BARD1, BMPR1A, BRCA1, BRCA2, BRIP1, CDC73, CDH1, CDK4, CDKN1B, CDKN2A, CEBPA, CHEK2, CTNNA1, DDX41, DICER1, ETV6, FH, FLCN, GATA2, LZTR1, MAX, MBD4, MEN1, MET, MLH1, MSH2, MSH3, MSH6, MUTYH,  NF1, NF2, NTHL1, PALB2, PHOX2B, PMS2, POT1, PRKAR1A, PTCH1, PTEN, RAD51C, RAD51D, RB1, RET, RPS20, RUNX1, SDHA, SDHAF2, SDHB, SDHC, SDHD, SMAD4, SMARCA4, SMARCB1, SMARCE1, STK11, SUFU, TMEM127, TP53, TSC1, TSC2, VHL, and WT1 (sequencing and deletion/duplication); EGFR, HOXB13, KIT, MITF, PDGFRA, POLD1, and POLE (sequencing only); EPCAM and GREM1 (deletion/duplication only).     09/04/2024 -  Chemotherapy   Patient is on Treatment Plan : BREAST TC q21d       Discussed the use of AI scribe software for clinical note transcription with the patient, who gave verbal consent to proceed.  History of Present Illness Mary Mendez is a 63 year old female with breast cancer undergoing chemotherapy who presents for her second cycle of treatment and follow-up.  She is currently receiving chemotherapy for breast cancer and presents for her second cycle, with two additional cycles remaining. She reports significant improvement in overall well-being, increased energy, and improved exercise tolerance, now able to ascend stairs with less dyspnea. She is eating well and denies fever, chills, cough, chest pain, or gastrointestinal symptoms. Laboratory results today show a mildly elevated white blood cell count and mildly low hemoglobin. Kidney and liver numbers were reported to be good. She continues to use her PICC line for infusions and prefers to have weekly dressing changes in the clinic.  She experiences intermittent pain and nausea related to chemotherapy, managed with oxycodone  and Compazine  as needed. She currently has six oxycodone  tablets remaining and uses them sparingly, primarily for post-injection pain. She did not require oxycodone  during her recent hospitalization but finds it helpful at home for analgesia and rest. She expresses concern about using acetaminophen , fearing it may mask fever, and is cautious with opioid use due to a family history of addiction.  She is also managing hypertension,  recently started on valsartan 160 mg daily in addition to ongoing amlodipine , as directed by her primary care provider. She will continue both medications until her next follow-up in three weeks.   MEDICAL HISTORY:  Past Medical History:  Diagnosis Date   Anxiety    Asthma    Bronchitis    Cancer (HCC) 06/2024   Left breast IDC   COVID-19 08/2021   Essential hypertension 09/11/2024   Hyperlipidemia    Hypertension    Mild intermittent asthma 09/11/2024   Pneumonia     SURGICAL HISTORY: Past Surgical History:  Procedure Laterality Date   ABDOMINAL HYSTERECTOMY  2010   and left ovaries;   BREAST BIOPSY     BREAST BIOPSY Left 06/04/2024   MM LT BREAST BX W LOC DEV 1ST LESION IMAGE BX SPEC STEREO GUIDE 06/04/2024 [facility]   BREAST BIOPSY Left 06/04/2024   MM LT BREAST BX W LOC DEV EA AD LESION IMG BX SPEC STEREO GUIDE 06/04/2024 [facility]   BREAST BIOPSY  07/15/2024   MM LT RADIOACTIVE SEED LOC MAMMO GUIDE 07/15/2024 [facility]   BREAST BIOPSY  07/15/2024   MM LT RADIOACTIVE SEED EA ADD LESION LOC MAMMO GUIDE 07/15/2024 [facility]   BREAST BIOPSY  07/15/2024   MM LT RADIOACTIVE SEED EA ADD LESION LOC MAMMO GUIDE 07/15/2024 [facility]   BREAST BIOPSY  07/15/2024   MM LT  RADIOACTIVE SEED EA ADD LESION LOC MAMMO GUIDE 07/15/2024 [facility]   BREAST EXCISIONAL BIOPSY Left    BREAST LUMPECTOMY WITH RADIOACTIVE SEED AND SENTINEL LYMPH NODE BIOPSY Left 07/17/2024   Procedure: BREAST LUMPECTOMY WITH RADIOACTIVE SEED AND SENTINEL LYMPH NODE BIOPSY;  Surgeon: Vernetta Berg, MD;  Location: [facility];  Service: General;  Laterality: Left;  RADIOACTIVE SEED GUIDED LEFT BREAST BRACKETED LUMPECTOMY AND SENTINEL NODE BIOPSY   BREAST REDUCTION SURGERY Bilateral 07/25/2024   Procedure: MAMMOPLASTY, REDUCTION;  Surgeon: Arelia Filippo, MD;  Location: [facility];  Service: Plastics;  Laterality: Bilateral;   BREAST SURGERY     CHOLECYSTECTOMY  N/A 02/18/2022   Procedure: LAPAROSCOPIC CHOLECYSTECTOMY;  Surgeon: Signe Mitzie LABOR, MD;  Location: [facility];  Service: General;  Laterality: N/A;   EXCISION, MASS, UPPER EXTREMITY Left 07/17/2024   Procedure: EXCISION, MASS, UPPER EXTREMITY;  Surgeon: Vernetta Berg, MD;  Location: [facility];  Service: General;  Laterality: Left;  EXCISION LEFT SHOULDER MASS   HYSTEROTOMY     PORT-A-CATH REMOVAL N/A 09/14/2024   Procedure: REMOVAL PORT-A-CATH;  Surgeon: Belinda Cough, MD;  Location: [facility];  Service: General;  Laterality: N/A;   PORTACATH PLACEMENT N/A 08/18/2024   Procedure: INSERTION, TUNNELED CENTRAL VENOUS DEVICE, WITH PORT;  Surgeon: Vernetta Berg, MD;  Location: [facility];  Service: General;  Laterality: N/A;  PORT PLACEMENT WITH ULTRASOUND GUIDANCE   TRANSESOPHAGEAL ECHOCARDIOGRAM (CATH LAB) N/A 09/16/2024   Procedure: TRANSESOPHAGEAL ECHOCARDIOGRAM;  Surgeon: Shlomo Wilbert SAUNDERS, MD;  [facility]: [facility];  Service: Cardiovascular;  Laterality: N/A;    SOCIAL HISTORY: Social History   Socioeconomic History   Marital status: Married    Spouse name: Not on file   Number of children: 2   Years of education: Not on file   Highest education level: Not on file  Occupational History   Occupation: Self Employed  Tobacco Use   Smoking status: Never    Passive exposure: Never   Smokeless tobacco: Never  Vaping Use   Vaping status: Never Used  Substance and Sexual Activity   Alcohol use: Yes    Comment: social   Drug use: Never   Sexual activity: Not Currently    Birth control/protection: Surgical    Comment: hyst  Other Topics Concern   Not on file  Social History Narrative   Not on file   Social Drivers of Health   Tobacco Use: Low Risk (10/08/2024)   Received from [facility]   Patient History    Smoking Tobacco Use: Never    Smokeless Tobacco Use: Never    Passive Exposure: Never  Financial Resource Strain: Not on file  Food Insecurity: No  Food Insecurity (09/11/2024)   Epic    Worried About Programme Researcher, Broadcasting/film/video in the Last Year: Never true    Ran Out of Food in the Last Year: Never true  Transportation Needs: No Transportation Needs (09/11/2024)   Epic    Lack of Transportation (Medical): No    Lack of Transportation (Non-Medical): No  Physical Activity: Not on file  Stress: Not on file  Social Connections: Unknown (09/11/2024)   Social Connection and Isolation Panel    Frequency of Communication with Friends and Family: More than three times a week    Frequency of Social Gatherings with Friends and Family: More than three times a week    Attends Religious Services: Patient declined    Database Administrator or Organizations: Patient declined  Attends Banker Meetings: Patient declined    Marital Status: Married  Catering Manager Violence: Not At Risk (09/11/2024)   Epic    Fear of Current or Ex-Partner: No    Emotionally Abused: No    Physically Abused: No    Sexually Abused: No  Depression (PHQ2-9): Low Risk (06/11/2024)   Depression (PHQ2-9)    PHQ-2 Score: 1  Alcohol Screen: Not on file  Housing: Low Risk (09/11/2024)   Epic    Unable to Pay for Housing in the Last Year: No    Number of Times Moved in the Last Year: 0    Homeless in the Last Year: No  Utilities: Not At Risk (09/11/2024)   Epic    Threatened with loss of utilities: No  Health Literacy: Not on file    FAMILY HISTORY: Family History  Problem Relation Age of Onset   Breast cancer Mother 19   Emphysema Father    Stroke Father    Stroke Sister    Asthma Sister    Leukemia Maternal Aunt 29 - 79   Asthma Paternal Uncle    Colon cancer Neg Hx    Colon polyps Neg Hx    Esophageal cancer Neg Hx    Rectal cancer Neg Hx    Stomach cancer Neg Hx     ALLERGIES:  has no known allergies.  MEDICATIONS:  Current Outpatient Medications  Medication Sig Dispense Refill   oxyCODONE  (OXY IR/ROXICODONE ) 5 MG immediate release  tablet Take 1 tablet (5 mg total) by mouth every 12 (twelve) hours as needed for severe pain (pain score 7-10). 20 tablet 0   valsartan (DIOVAN) 160 MG tablet Take 160 mg by mouth daily.     acetaminophen  (TYLENOL ) 500 MG tablet Take 1,000 mg by mouth every 6 (six) hours as needed for mild pain.     albuterol  (VENTOLIN  HFA) 108 (90 Base) MCG/ACT inhaler Inhale into the lungs.     amLODipine  (NORVASC ) 10 MG tablet Take 10 mg by mouth daily.     busPIRone  (BUSPAR ) 10 MG tablet Take 10 mg by mouth 3 (three) times daily as needed (Anxiety).     dexamethasone  (DECADRON ) 4 MG tablet Take 2 tabs by mouth 2 times daily starting day before chemo. Then take 2 tabs daily for 2 days starting day after chemo. Take with food. 30 tablet 1   fluticasone -salmeterol (ADVAIR) 250-50 MCG/ACT AEPB Inhale 1 puff into the lungs 2 (two) times daily.     guaiFENesin -dextromethorphan  (ROBITUSSIN DM) 100-10 MG/5ML syrup Take 5 mLs by mouth every 4 (four) hours as needed for cough.     lidocaine -prilocaine  (EMLA ) cream Apply to affected area once 30 g 3   loratadine  (CLARITIN ) 10 MG tablet Take 10 mg by mouth daily.     montelukast  (SINGULAIR ) 10 MG tablet Take 10 mg by mouth daily.     nystatin  (MYCOSTATIN /NYSTOP ) powder Apply 1 Application topically 3 (three) times daily. 15 g 0   ondansetron  (ZOFRAN ) 8 MG tablet Take 1 tablet (8 mg total) by mouth every 8 (eight) hours as needed for nausea or vomiting. Start on the third day after chemotherapy. 30 tablet 1   prochlorperazine  (COMPAZINE ) 10 MG tablet Take 1 tablet (10 mg total) by mouth every 6 (six) hours as needed for nausea or vomiting. 30 tablet 1   rosuvastatin  (CRESTOR ) 10 MG tablet Take 10 mg by mouth daily.     tretinoin  (RETIN-A ) 0.1 % cream Apply 1 application  topically at  bedtime.     zolpidem  (AMBIEN ) 5 MG tablet Take 1 tablet (5 mg total) by mouth at bedtime as needed for sleep. 10 tablet 0   No current facility-administered medications for this visit.    Facility-Administered Medications Ordered in Other Visits  Medication Dose Route Frequency Provider Last Rate Last Admin   0.9 %  sodium chloride  infusion   Intravenous Continuous Jailynn Lavalais, MD 10 mL/hr at 10/10/24 1208 New Bag at 10/10/24 1208   ECOG PERFORMANCE STATUS: 0 - Asymptomatic  Vitals:   10/10/24 1133  BP: (!) 158/96  Pulse: 97  Resp: 18  Temp: 98 F (36.7 C)  SpO2: 97%      Filed Weights   10/10/24 1133  Weight: 212 lb 14.4 oz (96.6 kg)       GENERAL:alert, no distress and comfortable No cervical or axillary adenopathy CTA bilaterally RRR Abdomen: Soft, non tender, non distended NO LE edema.   LABORATORY DATA:  I have reviewed the data as listed Lab Results  Component Value Date   WBC 11.9 (H) 10/10/2024   HGB 10.1 (L) 10/10/2024   HCT 29.4 (L) 10/10/2024   MCV 82.1 10/10/2024   PLT 264 10/10/2024   Lab Results  Component Value Date   NA 138 10/10/2024   K 4.0 10/10/2024   CL 105 10/10/2024   CO2 22 10/10/2024    RADIOGRAPHIC STUDIES: I have personally reviewed the radiological reports and agreed with the findings in the report.  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 Oncotype DX score 46, high recurrence risk. Chemotherapy recommended to reduce metastasis risk. - Recommend TC regimen: two chemotherapy drugs every 21 days for four cycles, followed by radiation and anti-estrogen therapy for five years. She had bacteremia, blood stream infection, port removed, c2 delayed.  Assessment and Plan Assessment & Plan Breast cancer undergoing chemotherapy Receiving chemotherapy with improved energy, exercise tolerance, and appetite. Mildly elevated WBC and decreased hemoglobin expected. Renal and hepatic function stable. Port site well-healed, tolerating treatment well. - Administered second cycle of  chemotherapy as scheduled. - Reviewed lab results, confirmed suitability for continued chemotherapy. - Arranged weekly PICC dressing changes, scheduled for next Friday. - Assessed port site, confirmed appropriate healing. - Provided guidance on remaining two chemotherapy cycles.  Chemotherapy-induced pain and nausea Experiences intermittent pain and nausea post-chemotherapy, using oxycodone  and Compazine  as needed.  Discussed opioid dependence risks and acetaminophen  masking fever. - Prescribed Compazine  as needed for nausea. - Prescribed oxycodone  5 mg, twenty tablets, as needed for pain during chemotherapy. - Educated on opioid risks, advised limiting use  - Advised caution with acetaminophen  due to fever masking.  Hypertension Managed on valsartan and amlodipine  per primary care provider's recommendation. Plan to reassess in three weeks. - Added valsartan 160 mg daily to regimen. - Continued amlodipine  per primary care provider's instructions. - Documented plan for primary care follow-up in three weeks to reassess antihypertensive regimen.  Time spent: 30 min including H and P, review of records, counseling and coordination of care. All questions were answered. The patient knows to call the clinic with any problems, questions or concerns.    Amber Stalls, MD 10/10/2024

## 2024-10-12 ENCOUNTER — Other Ambulatory Visit: Payer: Self-pay

## 2024-10-13 ENCOUNTER — Inpatient Hospital Stay

## 2024-10-13 ENCOUNTER — Telehealth: Payer: Self-pay

## 2024-10-13 VITALS — BP 119/74 | HR 77 | Temp 97.7°F | Resp 19

## 2024-10-13 DIAGNOSIS — C50212 Malignant neoplasm of upper-inner quadrant of left female breast: Secondary | ICD-10-CM

## 2024-10-13 DIAGNOSIS — Z5111 Encounter for antineoplastic chemotherapy: Secondary | ICD-10-CM | POA: Diagnosis not present

## 2024-10-13 MED ORDER — PEGFILGRASTIM-JMDB 6 MG/0.6ML ~~LOC~~ SOSY
6.0000 mg | PREFILLED_SYRINGE | Freq: Once | SUBCUTANEOUS | Status: AC
  Administered 2024-10-13: 16:00:00 6 mg via SUBCUTANEOUS
  Filled 2024-10-13: qty 0.6

## 2024-10-13 NOTE — Telephone Encounter (Signed)
 Message received from flush nurses that pt was Garn[place]/ns for her PICC dsg change appt today. Attempted to call pt and LVM for call back to r/s.

## 2024-10-13 NOTE — Progress Notes (Signed)
 Pt presented with low BP and dizziness today. Charge nurse Almarie POUR was notified and report given.

## 2024-10-13 NOTE — Progress Notes (Signed)
° °  PROVIDER:  VICENTA DASIE POLI, MD  MRN: [id number] DOB: [birth year] DATE OF ENCOUNTER: 10/13/2024 Interval History:     She is here for another postoperative visit and wound check.  She reports that the wound is completely healed.  She just had another round of chemotherapy this past Friday and only has 2 rounds of chemotherapy left    Physical Examination:   Physical Exam   She looks well on exam  The small open wound at the Port-A-Cath site is now completely closed and there is no evidence of ongoing infection   Assessment and Plan:       Diagnoses and all orders for this visit:  Postop check     She has now recovered from the infection of the Port-A-Cath.  She is getting her last 2 chemotherapy treatments with a PICC line.  She is doing very well now.  I will plan on seeing her back in 6 months unless there are any palpable concerns in either breast.  She agrees with the plans.    Return in about 6 months (around 04/13/2025).   The plan was discussed in detail with the patient today, who expressed understanding.  The patient has my contact information, and understands to call me with any additional questions or concerns in the interval.  I would be happy to see the patient back sooner if the need arises.   VICENTA DASIE POLI, MD

## 2024-10-13 NOTE — Progress Notes (Signed)
 Pt presented to flush today with c/o stomach pain, dizziness, feeling clammy. Pt noted to be hypotensive. Pt stated she took a new bp med during the day today (valsartan) when she usually takes it at night. Mallie, PA to chairside Pt drank 1 cup of water and some orange juice, ate some crackers, reported feeling much better.  VS improved, see flowsheet.  Pt stated she was ready to go home and would monitor her bp at home and would contact her prescribing physician tomorrow. Pt ambulatory to lobby.

## 2024-10-14 LAB — GLUCOSE, CAPILLARY: Glucose-Capillary: 108 mg/dL — ABNORMAL HIGH (ref 70–99)

## 2024-10-15 ENCOUNTER — Inpatient Hospital Stay

## 2024-10-15 ENCOUNTER — Inpatient Hospital Stay: Payer: Self-pay | Admitting: Infectious Diseases

## 2024-10-15 ENCOUNTER — Inpatient Hospital Stay: Admitting: Hematology and Oncology

## 2024-10-16 ENCOUNTER — Telehealth: Payer: Self-pay

## 2024-10-16 ENCOUNTER — Inpatient Hospital Stay

## 2024-10-16 MED ORDER — FLUCONAZOLE 150 MG PO TABS: 150.0000 mg | ORAL_TABLET | Freq: Every day | ORAL | 0 refills | Status: DC

## 2024-10-16 NOTE — Telephone Encounter (Signed)
 Pt called and is c/o vaginal candidiasis. Given recent burst of IV abx, Dr Loretha is ordering Diflucan  150 mg PO x1. Pt is aware and knows to call if one dose does not improve sx.

## 2024-10-17 ENCOUNTER — Inpatient Hospital Stay

## 2024-10-17 ENCOUNTER — Other Ambulatory Visit: Payer: Self-pay | Admitting: Hematology and Oncology

## 2024-10-17 DIAGNOSIS — Z5111 Encounter for antineoplastic chemotherapy: Secondary | ICD-10-CM | POA: Diagnosis not present

## 2024-10-18 ENCOUNTER — Inpatient Hospital Stay

## 2024-10-21 ENCOUNTER — Ambulatory Visit (INDEPENDENT_AMBULATORY_CARE_PROVIDER_SITE_OTHER): Admitting: Infectious Diseases

## 2024-10-21 ENCOUNTER — Encounter: Payer: Self-pay | Admitting: Infectious Diseases

## 2024-10-21 ENCOUNTER — Other Ambulatory Visit: Payer: Self-pay

## 2024-10-21 VITALS — Ht 65.0 in | Wt 210.0 lb

## 2024-10-21 DIAGNOSIS — Z452 Encounter for adjustment and management of vascular access device: Secondary | ICD-10-CM | POA: Diagnosis not present

## 2024-10-21 DIAGNOSIS — B9561 Methicillin susceptible Staphylococcus aureus infection as the cause of diseases classified elsewhere: Secondary | ICD-10-CM | POA: Diagnosis not present

## 2024-10-21 DIAGNOSIS — Z79899 Other long term (current) drug therapy: Secondary | ICD-10-CM | POA: Diagnosis not present

## 2024-10-21 DIAGNOSIS — R7881 Bacteremia: Secondary | ICD-10-CM

## 2024-10-21 NOTE — Progress Notes (Signed)
 " Patient Active Problem List   Diagnosis Date Noted   Medication management 10/21/2024   Hypervolemia 09/17/2024   Cough 09/17/2024   Transaminitis 09/11/2024   Essential hypertension 09/11/2024   Hyperkalemia 09/11/2024   MSSA bacteremia 09/11/2024   Mild intermittent asthma 09/11/2024   Sepsis (HCC) 09/10/2024   Genetic testing 06/19/2024   Malignant neoplasm of upper-inner quadrant of left breast in female, estrogen receptor positive (HCC) 06/09/2024   Class 2 obesity 04/09/2023   Varicose veins of both lower extremities 04/09/2023   Body mass index (BMI) 36.0-36.9, adult 01/19/2023   Constipation 09/07/2022   Nausea and vomiting 05/26/2022   Low back pain 02/15/2022   COVID-19 09/23/2021   Acute upper respiratory infection 01/25/2020   Pleurodynia 08/07/2019   Headache 10/29/2015   Conjunctivitis 06/23/2015   Sciatica, left side 06/23/2015    Patient's Medications  New Prescriptions   No medications on file  Previous Medications   ACETAMINOPHEN  (TYLENOL ) 500 MG TABLET    Take 1,000 mg by mouth every 6 (six) hours as needed for mild pain.   ALBUTEROL  (VENTOLIN  HFA) 108 (90 BASE) MCG/ACT INHALER    Inhale into the lungs.   AMLODIPINE  (NORVASC ) 10 MG TABLET    Take 10 mg by mouth daily.   BUSPIRONE  (BUSPAR ) 10 MG TABLET    Take 10 mg by mouth 3 (three) times daily as needed (Anxiety).   DEXAMETHASONE  (DECADRON ) 4 MG TABLET    Take 2 tabs by mouth 2 times daily starting day before chemo. Then take 2 tabs daily for 2 days starting day after chemo. Take with food.   FLUCONAZOLE  (DIFLUCAN ) 150 MG TABLET    Take 1 tablet (150 mg total) by mouth daily.   FLUTICASONE -SALMETEROL (ADVAIR) 250-50 MCG/ACT AEPB    Inhale 1 puff into the lungs 2 (two) times daily.   GUAIFENESIN -DEXTROMETHORPHAN  (ROBITUSSIN DM) 100-10 MG/5ML SYRUP    Take 5 mLs by mouth every 4 (four) hours as needed for cough.   LIDOCAINE -PRILOCAINE  (EMLA ) CREAM    Apply to affected area once   LORATADINE  (CLARITIN ) 10  MG TABLET    Take 10 mg by mouth daily.   MONTELUKAST  (SINGULAIR ) 10 MG TABLET    Take 10 mg by mouth daily.   NYSTATIN  (MYCOSTATIN /NYSTOP ) POWDER    Apply 1 Application topically 3 (three) times daily.   ONDANSETRON  (ZOFRAN ) 8 MG TABLET    Take 1 tablet (8 mg total) by mouth every 8 (eight) hours as needed for nausea or vomiting. Start on the third day after chemotherapy.   OXYCODONE  (OXY IR/ROXICODONE ) 5 MG IMMEDIATE RELEASE TABLET    Take 1 tablet (5 mg total) by mouth every 12 (twelve) hours as needed for severe pain (pain score 7-10).   PROCHLORPERAZINE  (COMPAZINE ) 10 MG TABLET    Take 1 tablet (10 mg total) by mouth every 6 (six) hours as needed for nausea or vomiting.   ROSUVASTATIN  (CRESTOR ) 10 MG TABLET    Take 10 mg by mouth daily.   TRETINOIN  (RETIN-A ) 0.1 % CREAM    Apply 1 application  topically at bedtime.   VALSARTAN (DIOVAN) 160 MG TABLET    Take 160 mg by mouth daily.   ZOLPIDEM  (AMBIEN ) 5 MG TABLET    Take 1 tablet (5 mg total) by mouth at bedtime as needed for sleep.  Modified Medications   No medications on file  Discontinued Medications   No medications on file    Subjective: Discussed the use of AI scribe software for  clinical note transcription with the patient, who gave verbal consent to proceed.   63 year old female with prior history as below including asthma, HTN, HLD, left breast cancer, obesity, chronic back pain who is here for HFU after recent admission 11/12-11/20 for MSSA bacteremia 2/2 Port-A-Cath infection.  Port-A-Cath removed on 11/16 with 20 cc of purulent appearing fluid from the port pocket.  Or culture grew MSSA.  Blood cultures cleared on 11/15.  TTE and TEE negative for vegetation.  MRI L-spine negative for discitis and osteomyelitis.  Patient was discharged on 11/20 to complete 2 weeks of IV cefazolin  through 11/29.  12/23 Reports completion of IV cefazolin  without missing doses or concerns.  Still has right arm PICC which she states is being used for  chemotherapy and has not been removed. Rt chest wound has healed.   Review of Systems: All systems reviewed with pertinent positive and negative as listed above  Past Medical History:  Diagnosis Date   Anxiety    Asthma    Bronchitis    Cancer (HCC) 06/2024   Left breast IDC   COVID-19 08/2021   Essential hypertension 09/11/2024   Hyperlipidemia    Hypertension    Mild intermittent asthma 09/11/2024   Pneumonia    Past Surgical History:  Procedure Laterality Date   ABDOMINAL HYSTERECTOMY  2010   and left ovaries;   BREAST BIOPSY     BREAST BIOPSY Left 06/04/2024   MM LT BREAST BX W LOC DEV 1ST LESION IMAGE BX SPEC STEREO GUIDE 06/04/2024 [facility]   BREAST BIOPSY Left 06/04/2024   MM LT BREAST BX W LOC DEV EA AD LESION IMG BX SPEC STEREO GUIDE 06/04/2024 [facility]   BREAST BIOPSY  07/15/2024   MM LT RADIOACTIVE SEED LOC MAMMO GUIDE 07/15/2024 [facility]   BREAST BIOPSY  07/15/2024   MM LT RADIOACTIVE SEED EA ADD LESION LOC MAMMO GUIDE 07/15/2024 [facility]   BREAST BIOPSY  07/15/2024   MM LT RADIOACTIVE SEED EA ADD LESION LOC MAMMO GUIDE 07/15/2024 [facility]   BREAST BIOPSY  07/15/2024   MM LT RADIOACTIVE SEED EA ADD LESION LOC MAMMO GUIDE 07/15/2024 [facility]   BREAST EXCISIONAL BIOPSY Left    BREAST LUMPECTOMY WITH RADIOACTIVE SEED AND SENTINEL LYMPH NODE BIOPSY Left 07/17/2024   Procedure: BREAST LUMPECTOMY WITH RADIOACTIVE SEED AND SENTINEL LYMPH NODE BIOPSY;  Surgeon: Vernetta Berg, MD;  Location: [facility];  Service: General;  Laterality: Left;  RADIOACTIVE SEED GUIDED LEFT BREAST BRACKETED LUMPECTOMY AND SENTINEL NODE BIOPSY   BREAST REDUCTION SURGERY Bilateral 07/25/2024   Procedure: MAMMOPLASTY, REDUCTION;  Surgeon: Arelia Filippo, MD;  Location: [facility];  Service: Plastics;  Laterality: Bilateral;   BREAST SURGERY     CHOLECYSTECTOMY N/A 02/18/2022   Procedure: LAPAROSCOPIC CHOLECYSTECTOMY;  Surgeon:  Signe Mitzie LABOR, MD;  Location: [facility];  Service: General;  Laterality: N/A;   EXCISION, MASS, UPPER EXTREMITY Left 07/17/2024   Procedure: EXCISION, MASS, UPPER EXTREMITY;  Surgeon: Vernetta Berg, MD;  Location: [facility];  Service: General;  Laterality: Left;  EXCISION LEFT SHOULDER MASS   HYSTEROTOMY     PORT-A-CATH REMOVAL N/A 09/14/2024   Procedure: REMOVAL PORT-A-CATH;  Surgeon: Belinda Cough, MD;  Location: [facility];  Service: General;  Laterality: N/A;   PORTACATH PLACEMENT N/A 08/18/2024   Procedure: INSERTION, TUNNELED CENTRAL VENOUS DEVICE, WITH PORT;  Surgeon: Vernetta Berg, MD;  Location: [facility];  Service: General;  Laterality: N/A;  PORT PLACEMENT WITH ULTRASOUND GUIDANCE  TRANSESOPHAGEAL ECHOCARDIOGRAM (CATH LAB) N/A 09/16/2024   Procedure: TRANSESOPHAGEAL ECHOCARDIOGRAM;  Surgeon: Shlomo Wilbert SAUNDERS, MD;  Location: [facility];  Service: Cardiovascular;  Laterality: N/A;    Social History[1]  Family History  Problem Relation Age of Onset   Breast cancer Mother 24   Emphysema Father    Stroke Father    Stroke Sister    Asthma Sister    Leukemia Maternal Aunt 75 - 79   Asthma Paternal Uncle    Colon cancer Neg Hx    Colon polyps Neg Hx    Esophageal cancer Neg Hx    Rectal cancer Neg Hx    Stomach cancer Neg Hx     Allergies[2]  Health Maintenance  Topic Date Due   COVID-19 Vaccine (1) Never done   Hepatitis C Screening  Never done   DTaP/Tdap/Td (1 - Tdap) Never done   Pneumococcal Vaccine: 50+ Years (1 of 2 - PCV) Never done   Zoster Vaccines- Shingrix (1 of 2) Never done   Cervical Cancer Screening (HPV/Pap Cotest)  Never done   Colonoscopy  Never done   Influenza Vaccine  Never done   Mammogram  06/18/2025   HIV Screening  Completed   Hepatitis B Vaccines 19-59 Average Risk  Aged Out   HPV VACCINES  Aged Out   Meningococcal B Vaccine  Aged Out    Objective: Ht 5' 5 (1.651 m)   Wt 210 lb (95.3 kg)   BMI 34.95 kg/m     Physical Exam Constitutional:      Appearance: Normal appearance.  HENT:     Head: Normocephalic and atraumatic.      Mouth: Mucous membranes are moist.  Eyes:    Conjunctiva/sclera: Conjunctivae normal.     Pupils: Pupils are equal, round, and b/l symmetrical    Cardiovascular:     Rate and Rhythm: Normal rate     Heart sounds:   Pulmonary:     Effort: Pulmonary effort is normal.     Breath sounds:  Abdominal:     General: Non distended     Palpations: soft.   Musculoskeletal:        General: Normal range of motion.   Skin:    General: Skin is warm and dry.     Comments: rt arm PICC okay with no signs of infection. Rt chest wound has healed.   Neurological:     General: grossly non focal     Mental Status: awake, alert  Psychiatric:        Mood and Affect: Mood normal.   Lab Results Lab Results  Component Value Date   WBC 11.9 (H) 10/10/2024   HGB 10.1 (L) 10/10/2024   HCT 29.4 (L) 10/10/2024   MCV 82.1 10/10/2024   PLT 264 10/10/2024    Lab Results  Component Value Date   CREATININE 0.87 10/10/2024   BUN 21 10/10/2024   NA 138 10/10/2024   K 4.0 10/10/2024   CL 105 10/10/2024   CO2 22 10/10/2024    Lab Results  Component Value Date   ALT 10 10/10/2024   AST 19 10/10/2024   ALKPHOS 83 10/10/2024   BILITOT 0.4 10/10/2024    No results found for: CHOL, HDL, LDLCALC, LDLDIRECT, TRIG, CHOLHDL No results found for: LABRPR, RPRTITER No results found for: HIV1RNAQUANT, HIV1RNAVL, CD4TABS   Microbiology  Results for orders placed or performed during the hospital encounter of 09/10/24  Culture, blood (Routine x 2)  Status: Abnormal   Collection Time: 09/10/24  6:49 PM   Specimen: BLOOD  Result Value Ref Range Status   Specimen Description   Final    BLOOD RIGHT ANTECUBITAL Performed at [facility], [address], [place], [place] [address]    Special Requests   Final    Blood Culture adequate  volume BOTTLES DRAWN AEROBIC AND ANAEROBIC Performed at [facility], [address] [place], [place] [address]    Culture  Setup Time   Final    GRAM POSITIVE COCCI IN CLUSTERS IN BOTH AEROBIC AND ANAEROBIC BOTTLES CRITICAL RESULT CALLED TO, READ BACK BY AND VERIFIED WITH: PHARMD CRYSTAL ROBERTSON ON 09/11/24 @ 1320 BY DRT Performed at [facility], [address], [place], [place] [address]    Culture STAPHYLOCOCCUS AUREUS (A)  Final   Report Status 09/13/2024 FINAL  Final   Organism ID, Bacteria STAPHYLOCOCCUS AUREUS  Final      Susceptibility   Staphylococcus aureus - MIC*    CIPROFLOXACIN <=0.5 SENSITIVE Sensitive     ERYTHROMYCIN <=0.25 SENSITIVE Sensitive     GENTAMICIN <=0.5 SENSITIVE Sensitive     OXACILLIN <=0.25 SENSITIVE Sensitive     TETRACYCLINE <=1 SENSITIVE Sensitive     VANCOMYCIN  <=0.5 SENSITIVE Sensitive     TRIMETH/SULFA <=10 SENSITIVE Sensitive     CLINDAMYCIN <=0.25 SENSITIVE Sensitive     RIFAMPIN <=0.5 SENSITIVE Sensitive     Inducible Clindamycin NEGATIVE Sensitive     LINEZOLID 1 SENSITIVE Sensitive     * STAPHYLOCOCCUS AUREUS  Blood Culture ID Panel (Reflexed)     Status: Abnormal   Collection Time: 09/10/24  6:49 PM  Result Value Ref Range Status   Enterococcus faecalis NOT DETECTED NOT DETECTED Final   Enterococcus Faecium NOT DETECTED NOT DETECTED Final   Listeria monocytogenes NOT DETECTED NOT DETECTED Final   Staphylococcus species DETECTED (A) NOT DETECTED Final    Comment: CRITICAL RESULT CALLED TO, READ BACK BY AND VERIFIED WITH: PHARMD CRYSTAL ROBERTSON ON 09/11/24 @ 1320 BY DRT    Staphylococcus aureus (BCID) DETECTED (A) NOT DETECTED Final    Comment: CRITICAL RESULT CALLED TO, READ BACK BY AND VERIFIED WITH: PHARMD CRYSTAL ROBERTSON ON 09/11/24 @ 1320 BY DRT    Staphylococcus epidermidis NOT DETECTED NOT DETECTED Final   Staphylococcus lugdunensis NOT DETECTED NOT DETECTED Final   Streptococcus species NOT  DETECTED NOT DETECTED Final   Streptococcus agalactiae NOT DETECTED NOT DETECTED Final   Streptococcus pneumoniae NOT DETECTED NOT DETECTED Final   Streptococcus pyogenes NOT DETECTED NOT DETECTED Final   A.calcoaceticus-baumannii NOT DETECTED NOT DETECTED Final   Bacteroides fragilis NOT DETECTED NOT DETECTED Final   Enterobacterales NOT DETECTED NOT DETECTED Final   Enterobacter cloacae complex NOT DETECTED NOT DETECTED Final   Escherichia coli NOT DETECTED NOT DETECTED Final   Klebsiella aerogenes NOT DETECTED NOT DETECTED Final   Klebsiella oxytoca NOT DETECTED NOT DETECTED Final   Klebsiella pneumoniae NOT DETECTED NOT DETECTED Final   Proteus species NOT DETECTED NOT DETECTED Final   Salmonella species NOT DETECTED NOT DETECTED Final   Serratia marcescens NOT DETECTED NOT DETECTED Final   Haemophilus influenzae NOT DETECTED NOT DETECTED Final   Neisseria meningitidis NOT DETECTED NOT DETECTED Final   Pseudomonas aeruginosa NOT DETECTED NOT DETECTED Final   Stenotrophomonas maltophilia NOT DETECTED NOT DETECTED Final   Candida albicans NOT DETECTED NOT DETECTED Final   Candida auris NOT DETECTED NOT DETECTED Final   Candida glabrata NOT DETECTED NOT  DETECTED Final   Candida krusei NOT DETECTED NOT DETECTED Final   Candida parapsilosis NOT DETECTED NOT DETECTED Final   Candida tropicalis NOT DETECTED NOT DETECTED Final   Cryptococcus neoformans/gattii NOT DETECTED NOT DETECTED Final   Meth resistant mecA/C and MREJ NOT DETECTED NOT DETECTED Final    Comment: Performed at [facility], [address] [place], [place] [address]  Resp panel by RT-PCR (RSV, Flu A&B, Covid) Anterior Nasal Swab     Status: None   Collection Time: 09/10/24  8:36 PM   Specimen: Anterior Nasal Swab  Result Value Ref Range Status   SARS Coronavirus 2 by RT PCR NEGATIVE NEGATIVE Final    Comment: (NOTE) SARS-CoV-2 target nucleic acids are NOT DETECTED.  The SARS-CoV-2 RNA is generally detectable  in upper respiratory specimens during the acute phase of infection. The lowest concentration of SARS-CoV-2 viral copies this assay can detect is 138 copies/mL. A negative result does not preclude SARS-Cov-2 infection and should not be used as the sole basis for treatment or other patient management decisions. A negative result may occur with  improper specimen collection/handling, submission of specimen other than nasopharyngeal swab, presence of viral mutation(s) within the areas targeted by this assay, and inadequate number of viral copies(<138 copies/mL). A negative result must be combined with clinical observations, patient history, and epidemiological information. The expected result is Negative.  Fact Sheet for Patients:  bloggercourse.com  Fact Sheet for Healthcare Providers:  seriousbroker.it  This test is no t yet approved or cleared by the [place]  FDA and  has been authorized for detection and/or diagnosis of SARS-CoV-2 by FDA under an Emergency Use Authorization (EUA). This EUA will remain  in effect (meaning this test can be used) for the duration of the COVID-19 declaration under Section 564(b)(1) of the Act, 21 U.S.C.section 360bbb-3(b)(1), unless the authorization is terminated  or revoked sooner.       Influenza A by PCR NEGATIVE NEGATIVE Final   Influenza B by PCR NEGATIVE NEGATIVE Final    Comment: (NOTE) The Xpert Xpress SARS-CoV-2/FLU/RSV plus assay is intended as an aid in the diagnosis of influenza from Nasopharyngeal swab specimens and should not be used as a sole basis for treatment. Nasal washings and aspirates are unacceptable for Xpert Xpress SARS-CoV-2/FLU/RSV testing.  Fact Sheet for Patients: bloggercourse.com  Fact Sheet for Healthcare Providers: seriousbroker.it  This test is not yet approved or cleared by the [place]  FDA and has  been authorized for detection and/or diagnosis of SARS-CoV-2 by FDA under an Emergency Use Authorization (EUA). This EUA will remain in effect (meaning this test can be used) for the duration of the COVID-19 declaration under Section 564(b)(1) of the Act, 21 U.S.C. section 360bbb-3(b)(1), unless the authorization is terminated or revoked.     Resp Syncytial Virus by PCR NEGATIVE NEGATIVE Final    Comment: (NOTE) Fact Sheet for Patients: bloggercourse.com  Fact Sheet for Healthcare Providers: seriousbroker.it  This test is not yet approved or cleared by the [place]  FDA and has been authorized for detection and/or diagnosis of SARS-CoV-2 by FDA under an Emergency Use Authorization (EUA). This EUA will remain in effect (meaning this test can be used) for the duration of the COVID-19 declaration under Section 564(b)(1) of the Act, 21 U.S.C. section 360bbb-3(b)(1), unless the authorization is terminated or revoked.  Performed at [facility], [address], [place], [place] [address]   Culture, blood (Routine X 2) w Reflex to ID Panel  Status: None   Collection Time: 09/13/24 12:52 AM   Specimen: BLOOD RIGHT ARM  Result Value Ref Range Status   Specimen Description   Final    BLOOD RIGHT ARM Performed at [facility], [address] [place], [place] [address]    Special Requests   Final    BOTTLES DRAWN AEROBIC ONLY Blood Culture adequate volume Performed at [facility], [address], [place], [place] [address]    Culture   Final    NO GROWTH 5 DAYS Performed at [facility], [address], [place], [place] [address]    Report Status 09/18/2024 FINAL  Final  Culture, blood (Routine X 2) w Reflex to ID Panel     Status: None   Collection Time: 09/13/24 12:52 AM   Specimen: BLOOD RIGHT ARM  Result Value Ref Range Status   Specimen Description   Final    BLOOD  RIGHT ARM Performed at [facility], [address], [place], [place] [address]    Special Requests   Final    BOTTLES DRAWN AEROBIC ONLY Blood Culture adequate volume Performed at [facility], [address] [place], [place] [address]    Culture   Final    NO GROWTH 5 DAYS Performed at [facility], [address], [place], [place] [address]    Report Status 09/18/2024 FINAL  Final  Surgical PCR screen     Status: None   Collection Time: 09/14/24  1:35 AM   Specimen: Nasal Mucosa; Nasal Swab  Result Value Ref Range Status   MRSA, PCR NEGATIVE NEGATIVE Final   Staphylococcus aureus NEGATIVE NEGATIVE Final    Comment: (NOTE) The Xpert SA Assay (FDA approved for NASAL specimens in patients 45 years of age and older), is one component of a comprehensive surveillance program. It is not intended to diagnose infection nor to guide or monitor treatment. Performed at [facility], [address] [place], [place] [address]   Aerobic/Anaerobic Culture w Gram Stain (surgical/deep wound)     Status: None   Collection Time: 09/14/24  9:20 AM   Specimen: Path fluid; Body Fluid  Result Value Ref Range Status   Specimen Description   Final    FLUID Performed at [facility], [address], [place], [place] [address]    Special Requests   Final    NONE Performed at [facility], [address] [place], [place] [address]    Gram Stain NO WBC SEEN RARE GRAM POSITIVE COCCI IN PAIRS   Final   Culture   Final    RARE STAPHYLOCOCCUS AUREUS NO ANAEROBES ISOLATED Performed at [facility], [address] [place], [place] [address]    Report Status 09/19/2024 FINAL  Final   Organism ID, Bacteria STAPHYLOCOCCUS AUREUS  Final      Susceptibility   Staphylococcus aureus - MIC*    CIPROFLOXACIN <=0.5 SENSITIVE Sensitive     ERYTHROMYCIN <=0.25 SENSITIVE Sensitive     GENTAMICIN <=0.5 SENSITIVE  Sensitive     OXACILLIN <=0.25 SENSITIVE Sensitive     TETRACYCLINE <=1 SENSITIVE Sensitive     VANCOMYCIN  <=0.5 SENSITIVE Sensitive     TRIMETH/SULFA <=10 SENSITIVE Sensitive     CLINDAMYCIN <=0.25 SENSITIVE Sensitive     RIFAMPIN <=0.5 SENSITIVE Sensitive     Inducible Clindamycin NEGATIVE Sensitive     LINEZOLID 1 SENSITIVE Sensitive     *  RARE STAPHYLOCOCCUS AUREUS  Culture, blood (Routine X 2) w Reflex to ID Panel     Status: None   Collection Time: 09/15/24 10:55 AM   Specimen: BLOOD  Result Value Ref Range Status   Specimen Description   Final    BLOOD BLOOD RIGHT HAND Performed at [facility], [address], [place], [place] [address]    Special Requests   Final    BOTTLES DRAWN AEROBIC ONLY Blood Culture results may not be optimal due to an inadequate volume of blood received in culture bottles Performed at [facility], [address], [place], [place] [address]    Culture   Final    NO GROWTH 5 DAYS Performed at [facility], [address], [place], [place] [address]    Report Status 09/20/2024 FINAL  Final  Culture, blood (Routine X 2) w Reflex to ID Panel     Status: None   Collection Time: 09/15/24 11:01 AM   Specimen: BLOOD  Result Value Ref Range Status   Specimen Description   Final    BLOOD BLOOD RIGHT HAND Performed at [facility], [address], [place], [place] [address]    Special Requests   Final    BOTTLES DRAWN AEROBIC ONLY Blood Culture results may not be optimal due to an inadequate volume of blood received in culture bottles Performed at [facility], [address], [place], [place] [address]    Culture   Final    NO GROWTH 5 DAYS Performed at [facility], [address], [place], [place] [address]    Report Status 09/20/2024 FINAL  Final   Imaging  No results found.  Assessment/Plan # MSSA bacteremia 2/2 below # Infected portacath with  associated cellulitis  - s/p Port-A-Cath removed on 11/16 with 20 cc of purulent appearing fluid from the port pocket.  Or culture grew MSSA.   - Blood cultures cleared on 11/15.   - TTE and TEE negative for vegetation.   - MRI L-spine negative for discitis and osteomyelitis.  - s/p  IV cefazolin  through 11/29  Plan  - 2 sets of blood cultures for surveillance  - fu as needed  - staff messaged Dr Loretha to assume PICC care as well as removal since getting chemotherapy   # Medication Monitoring  - 12/15 CMP unremarkable.  CBC with WBC 11.9, hemoglobin 10.1 and platelets 264  # PICC  - to be removed after chemo completion per Oncology   # Breast ca - on chemotherapy per Oncology, BREAST TC q21d   I personally spent a total of 30 minutes in the care of the patient today including reviewing last note from Oncology and surgery,  preparing to see the patient, getting/reviewing separately obtained history, performing a medically appropriate exam/evaluation, counseling and educating, placing orders, referring and communicating with other health care professionals, documenting clinical information in the EHR, independently interpreting results, communicating results, and coordinating care.  Of note, portions of this note may have been created with voice recognition software. While this note has been edited for accuracy, occasional wrong-word or sound-a-like substitutions may have occurred due to the inherent limitations of voice recognition software.   Annalee Joseph, MD [facility] 10/21/2024, 8:35 AM     [1]  Social History Tobacco Use   Smoking status: Never    Passive exposure: Never   Smokeless tobacco: Never  Vaping Use  Vaping status: Never Used  Substance Use Topics   Alcohol use: Yes    Comment: social   Drug use: Never  [2] No Known Allergies  "

## 2024-10-24 ENCOUNTER — Inpatient Hospital Stay

## 2024-10-27 ENCOUNTER — Other Ambulatory Visit: Payer: Self-pay | Admitting: Hematology and Oncology

## 2024-10-27 ENCOUNTER — Ambulatory Visit: Payer: Self-pay | Admitting: Infectious Diseases

## 2024-10-27 LAB — CULTURE, BLOOD (SINGLE)
MICRO NUMBER:: 17395382
MICRO NUMBER:: 17395394
Result:: NO GROWTH
Result:: NO GROWTH
SPECIMEN QUALITY:: ADEQUATE
SPECIMEN QUALITY:: ADEQUATE

## 2024-10-29 ENCOUNTER — Telehealth: Payer: Self-pay

## 2024-10-29 NOTE — Telephone Encounter (Signed)
 Pt confirmed appt for 1/2

## 2024-10-31 ENCOUNTER — Inpatient Hospital Stay: Attending: Hematology and Oncology

## 2024-10-31 ENCOUNTER — Other Ambulatory Visit: Payer: Self-pay

## 2024-10-31 ENCOUNTER — Inpatient Hospital Stay

## 2024-10-31 ENCOUNTER — Inpatient Hospital Stay (HOSPITAL_BASED_OUTPATIENT_CLINIC_OR_DEPARTMENT_OTHER): Admitting: Hematology and Oncology

## 2024-10-31 VITALS — BP 145/87 | HR 82 | Resp 17

## 2024-10-31 VITALS — BP 149/82 | HR 91 | Temp 98.1°F | Resp 17 | Wt 211.7 lb

## 2024-10-31 DIAGNOSIS — B3732 Chronic candidiasis of vulva and vagina: Secondary | ICD-10-CM | POA: Insufficient documentation

## 2024-10-31 DIAGNOSIS — Z1732 Human epidermal growth factor receptor 2 negative status: Secondary | ICD-10-CM | POA: Insufficient documentation

## 2024-10-31 DIAGNOSIS — C50212 Malignant neoplasm of upper-inner quadrant of left female breast: Secondary | ICD-10-CM

## 2024-10-31 DIAGNOSIS — Z5111 Encounter for antineoplastic chemotherapy: Secondary | ICD-10-CM | POA: Insufficient documentation

## 2024-10-31 DIAGNOSIS — L299 Pruritus, unspecified: Secondary | ICD-10-CM | POA: Diagnosis not present

## 2024-10-31 DIAGNOSIS — Z5189 Encounter for other specified aftercare: Secondary | ICD-10-CM | POA: Insufficient documentation

## 2024-10-31 DIAGNOSIS — Z1721 Progesterone receptor positive status: Secondary | ICD-10-CM | POA: Diagnosis not present

## 2024-10-31 DIAGNOSIS — Z170 Estrogen receptor positive status [ER+]: Secondary | ICD-10-CM

## 2024-10-31 DIAGNOSIS — D84821 Immunodeficiency due to drugs: Secondary | ICD-10-CM | POA: Insufficient documentation

## 2024-10-31 DIAGNOSIS — M2550 Pain in unspecified joint: Secondary | ICD-10-CM | POA: Diagnosis not present

## 2024-10-31 DIAGNOSIS — Z803 Family history of malignant neoplasm of breast: Secondary | ICD-10-CM | POA: Diagnosis not present

## 2024-10-31 DIAGNOSIS — Z806 Family history of leukemia: Secondary | ICD-10-CM | POA: Diagnosis not present

## 2024-10-31 LAB — CBC WITH DIFFERENTIAL (CANCER CENTER ONLY)
Abs Immature Granulocytes: 0.04 K/uL (ref 0.00–0.07)
Basophils Absolute: 0 K/uL (ref 0.0–0.1)
Basophils Relative: 0 %
Eosinophils Absolute: 0 K/uL (ref 0.0–0.5)
Eosinophils Relative: 0 %
HCT: 32.1 % — ABNORMAL LOW (ref 36.0–46.0)
Hemoglobin: 10.9 g/dL — ABNORMAL LOW (ref 12.0–15.0)
Immature Granulocytes: 1 %
Lymphocytes Relative: 12 %
Lymphs Abs: 0.8 K/uL (ref 0.7–4.0)
MCH: 28.5 pg (ref 26.0–34.0)
MCHC: 34 g/dL (ref 30.0–36.0)
MCV: 83.8 fL (ref 80.0–100.0)
Monocytes Absolute: 0.2 K/uL (ref 0.1–1.0)
Monocytes Relative: 2 %
Neutro Abs: 5.9 K/uL (ref 1.7–7.7)
Neutrophils Relative %: 85 %
Platelet Count: 346 K/uL (ref 150–400)
RBC: 3.83 MIL/uL — ABNORMAL LOW (ref 3.87–5.11)
RDW: 18.7 % — ABNORMAL HIGH (ref 11.5–15.5)
WBC Count: 6.9 K/uL (ref 4.0–10.5)
nRBC: 0 % (ref 0.0–0.2)

## 2024-10-31 LAB — CMP (CANCER CENTER ONLY)
ALT: 15 U/L (ref 0–44)
AST: 21 U/L (ref 15–41)
Albumin: 4.5 g/dL (ref 3.5–5.0)
Alkaline Phosphatase: 82 U/L (ref 38–126)
Anion gap: 12 (ref 5–15)
BUN: 22 mg/dL (ref 8–23)
CO2: 20 mmol/L — ABNORMAL LOW (ref 22–32)
Calcium: 9.5 mg/dL (ref 8.9–10.3)
Chloride: 105 mmol/L (ref 98–111)
Creatinine: 0.76 mg/dL (ref 0.44–1.00)
GFR, Estimated: >60 mL/min
Glucose, Bld: 146 mg/dL — ABNORMAL HIGH (ref 70–99)
Potassium: 4.3 mmol/L (ref 3.5–5.1)
Sodium: 137 mmol/L (ref 135–145)
Total Bilirubin: 0.4 mg/dL (ref 0.0–1.2)
Total Protein: 7.4 g/dL (ref 6.5–8.1)

## 2024-10-31 MED ORDER — DEXAMETHASONE SOD PHOSPHATE PF 10 MG/ML IJ SOLN
10.0000 mg | Freq: Once | INTRAMUSCULAR | Status: AC
  Administered 2024-10-31: 10 mg via INTRAVENOUS

## 2024-10-31 MED ORDER — SODIUM CHLORIDE 0.9 % IV SOLN
75.0000 mg/m2 | Freq: Once | INTRAVENOUS | Status: AC
  Administered 2024-10-31: 160 mg via INTRAVENOUS
  Filled 2024-10-31: qty 16

## 2024-10-31 MED ORDER — SODIUM CHLORIDE 0.9 % IV SOLN: INTRAVENOUS | Status: DC

## 2024-10-31 MED ORDER — SODIUM CHLORIDE 0.9 % IV SOLN
600.0000 mg/m2 | Freq: Once | INTRAVENOUS | Status: AC
  Administered 2024-10-31: 1280 mg via INTRAVENOUS
  Filled 2024-10-31: qty 64

## 2024-10-31 MED ORDER — FLUCONAZOLE 150 MG PO TABS: 150.0000 mg | ORAL_TABLET | Freq: Every day | ORAL | 1 refills | Status: DC

## 2024-10-31 MED ORDER — PALONOSETRON HCL INJECTION 0.25 MG/5ML
0.2500 mg | Freq: Once | INTRAVENOUS | Status: AC
  Administered 2024-10-31: 0.25 mg via INTRAVENOUS
  Filled 2024-10-31: qty 5

## 2024-10-31 NOTE — Progress Notes (Signed)
 Cytoxan  diluted in NS273mL, Onu:[id number], Exp: 02/26/2026.  Mary Mendez [place], [place], BCPS, BCOP 10/31/2024 9:56 AM

## 2024-10-31 NOTE — Patient Instructions (Addendum)
 [facility] - A DEPT OF [facility]  Discharge Instructions: Thank you for choosing [facility] to provide your oncology and hematology care.   If you have a lab appointment with the Cancer Center, please go directly to the Cancer Center and check in at the registration area.   Wear comfortable clothing and clothing appropriate for easy access to any Portacath or PICC line.   We strive to give you quality time with your provider. You may need to reschedule your appointment if you arrive late (15 or more minutes).  Arriving late affects you and other patients whose appointments are after yours.  Also, if you miss three or more appointments without notifying the office, you may be dismissed from the clinic at the provider's discretion.      For prescription refill requests, have your pharmacy contact our office and allow 72 hours for refills to be completed.    Today you received the following chemotherapy and/or immunotherapy agents: docetaxel and cyclophosphamide      To help prevent nausea and vomiting after your treatment, we encourage you to take your nausea medication as directed.  BELOW ARE SYMPTOMS THAT SHOULD BE REPORTED IMMEDIATELY: *FEVER GREATER THAN 100.4 F (38 C) OR HIGHER *CHILLS OR SWEATING *NAUSEA AND VOMITING THAT IS NOT CONTROLLED WITH YOUR NAUSEA MEDICATION *UNUSUAL SHORTNESS OF BREATH *UNUSUAL BRUISING OR BLEEDING *URINARY PROBLEMS (pain or burning when urinating, or frequent urination) *BOWEL PROBLEMS (unusual diarrhea, constipation, pain near the anus) TENDERNESS IN MOUTH AND THROAT WITH OR WITHOUT PRESENCE OF ULCERS (sore throat, sores in mouth, or a toothache) UNUSUAL RASH, SWELLING OR PAIN  UNUSUAL VAGINAL DISCHARGE OR ITCHING   Items with * indicate a potential emergency and should be followed up as soon as possible or go to the Emergency Department if any problems should occur.  Please show the CHEMOTHERAPY ALERT  CARD or IMMUNOTHERAPY ALERT CARD at check-in to the Emergency Department and triage nurse.  Should you have questions after your visit or need to cancel or reschedule your appointment, please contact [facility] - A DEPT OF [facility]  Dept: [phone]  and follow the prompts.  Office hours are 8:00 a.m. to 4:30 p.m. Monday - Friday. Please note that voicemails left after 4:00 p.m. may not be returned until the following business day.  We are closed weekends and major holidays. You have access to a nurse at all times for urgent questions. Please call the main number to the clinic Dept: [phone] and follow the prompts.   For any non-urgent questions, you may also contact your provider using MyChart. We now offer e-Visits for anyone 25 and older to request care online for non-urgent symptoms. For details visit [email or web address].   Also download the MyChart app! Go to the app store, search "MyChart", open the app, select [facility], and log in with your MyChart username and password.

## 2024-10-31 NOTE — Progress Notes (Signed)
 [facility] CONSULT NOTE  Patient Care Team: [name], MD as PCP - General (Family Medicine) Tyree Nanetta SAILOR, RN as Oncology Nurse Navigator Gerome, Devere HERO, RN as Oncology Nurse Navigator Vernetta Berg, MD as Consulting Physician (General Surgery) Loretha Ash, MD as Consulting Physician (Hematology and Oncology) Izell Domino, MD as Attending Physician (Radiation Oncology)  CHIEF COMPLAINTS/PURPOSE OF CONSULTATION:  Newly diagnosed breast cancer  HISTORY OF PRESENTING ILLNESS:  Mary Mendez 64 y.o. female is here because of recent diagnosis of left breast IDC  I reviewed her records extensively and collaborated the history with the patient.  SUMMARY OF ONCOLOGIC HISTORY: Oncology History  Malignant neoplasm of upper-inner quadrant of left breast in female, estrogen receptor positive (HCC)  06/03/2024 Pathology Results   Breast, left, needle core biopsy, mass with calcifications 5mm upper inner quadrant (ribbon clip) :      INVASIVE MAMMARY CARCINOMA      TUBULE FORMATION: SCORE 3      NUCLEAR PLEOMORPHISM: SCORE 2      MITOTIC COUNT: SCORE 1      TOTAL SCORE: 6      OVERALL GRADE: 2      LYMPHOVASCULAR INVASION: NOT IDENTIFIED      CANCER LENGTH: 5.5 MM      CALCIFICATIONS: PRESENT      OTHER FINDINGS: NONE      SEE NOTE       2. Breast, left, needle core biopsy, 2.5cm group upper inner quadrant (x clip) :      DUCTAL CARCINOMA IN SITU, INTERMEDIATE GRADE, SOLID TYPE      NECROSIS: PRESENT      CALCIFICATIONS: PRESENT      DCIS LENGTH: 1.0 CM / 10 MM      SEE NOTE  IMMUNOHISTOCHEMICAL AND MORPHOMETRIC ANALYSIS PERFORMED MANUALLY The tumor cells are NEGATIVE for Her2 (0). Estrogen Receptor:  100%, POSITIVE, STRONG STAINING INTENSITY Progesterone Receptor:  20%, POSITIVE, MODERATE STAINING INTENSITY Proliferation Marker Ki67:  60%     06/04/2024 Mammogram   Suspicious findings. The suggested screening mammographic asymmetry is  consistent with a small irregular mass/focal asymmetry with a few associated calcifications projecting in the upper inner left breast measuring 5 mm mammographically. This may correspond to the hypoechoic mass seen on sonography at 10 o'clock, 8 cm the nipple or to the second sonographic abnormality, at 10:30 o'clock, 10 cm from the nipple. This latter finding is smaller and more subtle. Given the uncertainty of which sonographic finding may be the correlate to the mammographic abnormality, stereotactic biopsy of the mammographic finding is recommended.  2.5 cm group of suspicious calcifications in the medial left breast, anterior to the above described small irregular mass/focal asymmetry. Stereotactic core needle biopsy is recommended.   06/09/2024 Initial Diagnosis   Malignant neoplasm of upper-inner quadrant of left breast in female, estrogen receptor positive (HCC)   06/11/2024 Cancer Staging   Staging form: Breast, AJCC 8th Edition - Clinical stage from 06/11/2024: Stage IA (cT1b, cN0, cM0, G2, ER+, PR+, HER2-) - Signed by Loretha Ash, MD on 06/11/2024 Stage prefix: Initial diagnosis Histologic grading system: 3 grade system Laterality: Left Staged by: Pathologist and managing physician Stage used in treatment planning: Yes National guidelines used in treatment planning: Yes Type of national guideline used in treatment planning: NCCN    Genetic Testing   [facility] CancerNext-Expanded Panel+RNA was Negative. 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 Report date is 06/18/2024.   The  CancerNext-Expanded gene panel offered by [employer] and includes sequencing, rearrangement, and RNA analysis for the following 77 genes: AIP, ALK, APC, ATM, AXIN2, BAP1, BARD1, BMPR1A, BRCA1, BRCA2, BRIP1, CDC73, CDH1, CDK4, CDKN1B, CDKN2A, CEBPA, CHEK2, CTNNA1, DDX41, DICER1, ETV6, FH, FLCN, GATA2, LZTR1, MAX, MBD4, MEN1, MET, MLH1, MSH2, MSH3, MSH6, MUTYH,  NF1, NF2, NTHL1, PALB2, PHOX2B, PMS2, POT1, PRKAR1A, PTCH1, PTEN, RAD51C, RAD51D, RB1, RET, RPS20, RUNX1, SDHA, SDHAF2, SDHB, SDHC, SDHD, SMAD4, SMARCA4, SMARCB1, SMARCE1, STK11, SUFU, TMEM127, TP53, TSC1, TSC2, VHL, and WT1 (sequencing and deletion/duplication); EGFR, HOXB13, KIT, MITF, PDGFRA, POLD1, and POLE (sequencing only); EPCAM and GREM1 (deletion/duplication only).     09/04/2024 -  Chemotherapy   Patient is on Treatment Plan : BREAST TC q21d       Discussed the use of AI scribe software for clinical note transcription with the patient, who gave verbal consent to proceed.  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  She reports overall good tolerance of recent cycles, with predictable side effects. She receives supportive injections on Mondays and experiences feeling cold during treatments, particularly when using the cold cap.  She describes transient back pain following chemotherapy, which was unresponsive to oxycodone  but improved with acetaminophen  and supportive care. She has discontinued oxycodone  use. She also notes intermittent shoulder discomfort, which she attributes to her sleeping position. No swelling is present.  She has a PICC in place for chemotherapy administration, requiring weekly dressing changes. She inquired about port removal, which is planned for immediately after her final chemotherapy cycle. She reports some discomfort when sleeping on her shoulder but does not associate this with the port.  She experiences intermittent vulvovaginal irritation and pruritus, and reports a history of yeast infection during treatment. Previous episodes have resolved with a single dose of antifungal therapy, and she requests a refill due to occasional recurrence.  She notes constipation  during the first week after chemotherapy, which subsequently resolves. She maintains regular bowel movements otherwise and has increased her water intake. No abdominal pain is present.   Rest of the pertinent 10 point ROS reviewed and neg   MEDICAL HISTORY:  Past Medical History:  Diagnosis Date   Anxiety    Asthma    Bronchitis    Cancer (HCC) 06/2024   Left breast IDC   COVID-19 08/2021   Essential hypertension 09/11/2024   Hyperlipidemia    Hypertension    Mild intermittent asthma 09/11/2024   Pneumonia     SURGICAL HISTORY: Past Surgical History:  Procedure Laterality Date   ABDOMINAL HYSTERECTOMY  2010   and left ovaries;   BREAST BIOPSY     BREAST BIOPSY Left 06/04/2024   MM LT BREAST BX W LOC DEV 1ST LESION IMAGE BX SPEC STEREO GUIDE 06/04/2024 [facility]   BREAST BIOPSY Left 06/04/2024   MM LT BREAST BX W LOC DEV EA AD LESION IMG BX SPEC STEREO GUIDE 06/04/2024 [facility]   BREAST BIOPSY  07/15/2024   MM LT RADIOACTIVE SEED LOC MAMMO GUIDE 07/15/2024 [facility]   BREAST BIOPSY  07/15/2024   MM LT RADIOACTIVE SEED EA ADD LESION LOC MAMMO GUIDE 07/15/2024 [facility]   BREAST BIOPSY  07/15/2024   MM LT RADIOACTIVE SEED EA ADD LESION LOC MAMMO GUIDE 07/15/2024 [facility]   BREAST BIOPSY  07/15/2024   MM LT RADIOACTIVE SEED EA ADD LESION  LOC MAMMO GUIDE 07/15/2024 [facility]   BREAST EXCISIONAL BIOPSY Left    BREAST LUMPECTOMY WITH RADIOACTIVE SEED AND SENTINEL LYMPH NODE BIOPSY Left 07/17/2024   Procedure: BREAST LUMPECTOMY WITH RADIOACTIVE SEED AND SENTINEL LYMPH NODE BIOPSY;  Surgeon: Vernetta Berg, MD;  Location: [facility];  Service: General;  Laterality: Left;  RADIOACTIVE SEED GUIDED LEFT BREAST BRACKETED LUMPECTOMY AND SENTINEL NODE BIOPSY   BREAST REDUCTION SURGERY Bilateral 07/25/2024   Procedure: MAMMOPLASTY, REDUCTION;  Surgeon: Arelia Filippo, MD;  Location: [facility];  Service: Plastics;  Laterality:  Bilateral;   BREAST SURGERY     CHOLECYSTECTOMY N/A 02/18/2022   Procedure: LAPAROSCOPIC CHOLECYSTECTOMY;  Surgeon: Signe Mitzie LABOR, MD;  Location: [facility];  Service: General;  Laterality: N/A;   EXCISION, MASS, UPPER EXTREMITY Left 07/17/2024   Procedure: EXCISION, MASS, UPPER EXTREMITY;  Surgeon: Vernetta Berg, MD;  Location: [facility];  Service: General;  Laterality: Left;  EXCISION LEFT SHOULDER MASS   HYSTEROTOMY     PORT-A-CATH REMOVAL N/A 09/14/2024   Procedure: REMOVAL PORT-A-CATH;  Surgeon: Belinda Cough, MD;  Location: [facility];  Service: General;  Laterality: N/A;   PORTACATH PLACEMENT N/A 08/18/2024   Procedure: INSERTION, TUNNELED CENTRAL VENOUS DEVICE, WITH PORT;  Surgeon: Vernetta Berg, MD;  Location: [facility];  Service: General;  Laterality: N/A;  PORT PLACEMENT WITH ULTRASOUND GUIDANCE   TRANSESOPHAGEAL ECHOCARDIOGRAM (CATH LAB) N/A 09/16/2024   Procedure: TRANSESOPHAGEAL ECHOCARDIOGRAM;  Surgeon: Shlomo Wilbert SAUNDERS, MD;  Location: [facility];  Service: Cardiovascular;  Laterality: N/A;    SOCIAL HISTORY: Social History   Socioeconomic History   Marital status: Married    Spouse name: Not on file   Number of children: 2   Years of education: Not on file   Highest education level: Not on file  Occupational History   Occupation: Self Employed  Tobacco Use   Smoking status: Never    Passive exposure: Never   Smokeless tobacco: Never  Vaping Use   Vaping status: Never Used  Substance and Sexual Activity   Alcohol use: Yes    Comment: social   Drug use: Never   Sexual activity: Not Currently    Birth control/protection: Surgical    Comment: hyst  Other Topics Concern   Not on file  Social History Narrative   Not on file   Social Drivers of Health   Tobacco Use: Low Risk (10/21/2024)   Patient History    Smoking Tobacco Use: Never    Smokeless Tobacco Use: Never    Passive Exposure: Never  Financial Resource Strain: Not on file   Food Insecurity: No Food Insecurity (09/11/2024)   Epic    Worried About Programme Researcher, Broadcasting/film/video in the Last Year: Never true    Ran Out of Food in the Last Year: Never true  Transportation Needs: No Transportation Needs (09/11/2024)   Epic    Lack of Transportation (Medical): No    Lack of Transportation (Non-Medical): No  Physical Activity: Not on file  Stress: Not on file  Social Connections: Unknown (09/11/2024)   Social Connection and Isolation Panel    Frequency of Communication with Friends and Family: More than three times a week    Frequency of Social Gatherings with Friends and Family: More than three times a week    Attends Religious Services: Patient declined    Database Administrator or Organizations: Patient declined    Attends Banker Meetings: Patient declined    Marital  Status: Married  Catering Manager Violence: Not At Risk (09/11/2024)   Epic    Fear of Current or Ex-Partner: No    Emotionally Abused: No    Physically Abused: No    Sexually Abused: No  Depression (PHQ2-9): Low Risk (06/11/2024)   Depression (PHQ2-9)    PHQ-2 Score: 1  Alcohol Screen: Not on file  Housing: Low Risk (09/11/2024)   Epic    Unable to Pay for Housing in the Last Year: No    Number of Times Moved in the Last Year: 0    Homeless in the Last Year: No  Utilities: Not At Risk (09/11/2024)   Epic    Threatened with loss of utilities: No  Health Literacy: Not on file    FAMILY HISTORY: Family History  Problem Relation Age of Onset   Breast cancer Mother 22   Emphysema Father    Stroke Father    Stroke Sister    Asthma Sister    Leukemia Maternal Aunt 30 - 79   Asthma Paternal Uncle    Colon cancer Neg Hx    Colon polyps Neg Hx    Esophageal cancer Neg Hx    Rectal cancer Neg Hx    Stomach cancer Neg Hx     ALLERGIES:  has no known allergies.  MEDICATIONS:  Current Outpatient Medications  Medication Sig Dispense Refill   acetaminophen  (TYLENOL ) 500 MG  tablet Take 1,000 mg by mouth every 6 (six) hours as needed for mild pain.     albuterol  (VENTOLIN  HFA) 108 (90 Base) MCG/ACT inhaler Inhale into the lungs.     amLODipine  (NORVASC ) 10 MG tablet Take 10 mg by mouth daily.     busPIRone  (BUSPAR ) 10 MG tablet Take 10 mg by mouth 3 (three) times daily as needed (Anxiety).     dexamethasone  (DECADRON ) 4 MG tablet Take 2 tabs by mouth 2 times daily starting day before chemo. Then take 2 tabs daily for 2 days starting day after chemo. Take with food. 30 tablet 1   fluconazole  (DIFLUCAN ) 150 MG tablet Take 1 tablet (150 mg total) by mouth daily. 1 tablet 0   fluticasone -salmeterol (ADVAIR) 250-50 MCG/ACT AEPB Inhale 1 puff into the lungs 2 (two) times daily.     guaiFENesin -dextromethorphan  (ROBITUSSIN DM) 100-10 MG/5ML syrup Take 5 mLs by mouth every 4 (four) hours as needed for cough.     lidocaine -prilocaine  (EMLA ) cream Apply to affected area once 30 g 3   loratadine  (CLARITIN ) 10 MG tablet Take 10 mg by mouth daily.     montelukast  (SINGULAIR ) 10 MG tablet Take 10 mg by mouth daily.     nystatin  (MYCOSTATIN /NYSTOP ) powder Apply 1 Application topically 3 (three) times daily. 15 g 0   ondansetron  (ZOFRAN ) 8 MG tablet Take 1 tablet (8 mg total) by mouth every 8 (eight) hours as needed for nausea or vomiting. Start on the third day after chemotherapy. 30 tablet 1   oxyCODONE  (OXY IR/ROXICODONE ) 5 MG immediate release tablet Take 1 tablet (5 mg total) by mouth every 12 (twelve) hours as needed for severe pain (pain score 7-10). 20 tablet 0   prochlorperazine  (COMPAZINE ) 10 MG tablet Take 1 tablet (10 mg total) by mouth every 6 (six) hours as needed for nausea or vomiting. 30 tablet 1   rosuvastatin  (CRESTOR ) 10 MG tablet Take 10 mg by mouth daily.     tretinoin  (RETIN-A ) 0.1 % cream Apply 1 application  topically at bedtime.     valsartan (DIOVAN)  160 MG tablet Take 160 mg by mouth daily.     zolpidem  (AMBIEN ) 5 MG tablet Take 1 tablet (5 mg total) by  mouth at bedtime as needed for sleep. 10 tablet 0   No current facility-administered medications for this visit.   ECOG PERFORMANCE STATUS: 0 - Asymptomatic  Vitals:   10/31/24 0859  BP: (!) 149/82  Pulse: 91  Resp: 17  Temp: 98.1 F (36.7 C)  SpO2: 100%      Filed Weights   10/31/24 0859  Weight: 211 lb 11.2 oz (96 kg)   GENERAL:alert, no distress and comfortable No cervical or axillary adenopathy CTA bilaterally RRR Abdomen: Soft, non tender, non distended NO LE edema.   LABORATORY DATA:  I have reviewed the data as listed Lab Results  Component Value Date   WBC 6.9 10/31/2024   HGB 10.9 (L) 10/31/2024   HCT 32.1 (L) 10/31/2024   MCV 83.8 10/31/2024   PLT 346 10/31/2024   Lab Results  Component Value Date   NA 138 10/10/2024   K 4.0 10/10/2024   CL 105 10/10/2024   CO2 22 10/10/2024    RADIOGRAPHIC STUDIES: I have personally reviewed the radiological reports and agreed with the findings in the report.  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 Oncotype DX score 46, high recurrence risk. Chemotherapy recommended to reduce metastasis risk. - Recommend TC regimen: two chemotherapy drugs every 21 days for four cycles, followed by radiation and anti-estrogen therapy for five years. She had bacteremia, blood stream infection, port removed, c2 delayed.  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 Radiation therapy planned post-chemotherapy, timing dependent on hematologic recovery. - Continued current chemotherapy regimen as scheduled. - Monitored and managed chemotherapy-related side effects. -  Planned radiation therapy to begin approximately four six weeks after completion of chemotherapy, pending hematologic recovery.  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 - Prescribed additional single-dose antifungal medication with one refill for recurrent vaginal candidiasis. - Instructed to use antifungal therapy as needed for symptomatic episodes.  Chemotherapy administration and PICC management PICC removal planned post-final chemotherapy cycle. - Continued weekly dressing changes until completion of chemotherapy.  GCSF induced arthralgia Ok to use PRN tylenol  as needed for pain. She uses it very sparingly   Time spent: 30 min including H and P, review of records, counseling and coordination of care. All questions were answered. The patient knows to call the clinic with any problems, questions or concerns.    Amber Stalls, MD 10/31/2024

## 2024-10-31 NOTE — Progress Notes (Signed)
 cb

## 2024-11-03 ENCOUNTER — Inpatient Hospital Stay

## 2024-11-03 VITALS — BP 152/92 | HR 88 | Temp 98.6°F | Resp 15

## 2024-11-03 DIAGNOSIS — Z5111 Encounter for antineoplastic chemotherapy: Secondary | ICD-10-CM | POA: Diagnosis not present

## 2024-11-03 DIAGNOSIS — Z170 Estrogen receptor positive status [ER+]: Secondary | ICD-10-CM

## 2024-11-03 MED ORDER — PEGFILGRASTIM-JMDB 6 MG/0.6ML ~~LOC~~ SOSY
6.0000 mg | PREFILLED_SYRINGE | Freq: Once | SUBCUTANEOUS | Status: AC
  Administered 2024-11-03: 6 mg via SUBCUTANEOUS
  Filled 2024-11-03: qty 0.6

## 2024-11-05 ENCOUNTER — Inpatient Hospital Stay: Admitting: Hematology and Oncology

## 2024-11-05 ENCOUNTER — Inpatient Hospital Stay

## 2024-11-06 ENCOUNTER — Encounter: Payer: Self-pay | Admitting: *Deleted

## 2024-11-06 DIAGNOSIS — C50212 Malignant neoplasm of upper-inner quadrant of left female breast: Secondary | ICD-10-CM

## 2024-11-07 ENCOUNTER — Inpatient Hospital Stay

## 2024-11-07 DIAGNOSIS — Z5111 Encounter for antineoplastic chemotherapy: Secondary | ICD-10-CM | POA: Diagnosis not present

## 2024-11-09 ENCOUNTER — Emergency Department (HOSPITAL_BASED_OUTPATIENT_CLINIC_OR_DEPARTMENT_OTHER)
Admission: EM | Admit: 2024-11-09 | Discharge: 2024-11-09 | Disposition: A | Discharge status: Home / Self Care | Attending: Emergency Medicine | Admitting: Emergency Medicine

## 2024-11-09 ENCOUNTER — Encounter (HOSPITAL_BASED_OUTPATIENT_CLINIC_OR_DEPARTMENT_OTHER): Payer: Self-pay

## 2024-11-09 DIAGNOSIS — Z853 Personal history of malignant neoplasm of breast: Secondary | ICD-10-CM | POA: Insufficient documentation

## 2024-11-09 DIAGNOSIS — R000 Tachycardia, unspecified: Secondary | ICD-10-CM | POA: Insufficient documentation

## 2024-11-09 DIAGNOSIS — R3915 Urgency of urination: Secondary | ICD-10-CM | POA: Diagnosis present

## 2024-11-09 DIAGNOSIS — L292 Pruritus vulvae: Secondary | ICD-10-CM | POA: Diagnosis not present

## 2024-11-09 DIAGNOSIS — N3000 Acute cystitis without hematuria: Secondary | ICD-10-CM | POA: Diagnosis not present

## 2024-11-09 LAB — URINALYSIS, ROUTINE W REFLEX MICROSCOPIC
Glucose, UA: NEGATIVE mg/dL
Ketones, ur: NEGATIVE mg/dL
Leukocytes,Ua: NEGATIVE
Nitrite: POSITIVE — AB
Specific Gravity, Urine: 1.011 (ref 1.005–1.030)
WBC, UA: >50 WBC/hpf (ref 0–5)
pH: 5.5 (ref 5.0–8.0)

## 2024-11-09 MED ORDER — CEPHALEXIN 250 MG PO CAPS
500.0000 mg | ORAL_CAPSULE | Freq: Once | ORAL | Status: AC
  Administered 2024-11-09: 500 mg via ORAL
  Filled 2024-11-09: qty 2

## 2024-11-09 MED ORDER — FLUCONAZOLE 150 MG PO TABS: 150.0000 mg | ORAL_TABLET | ORAL | 0 refills | Status: DC | PRN

## 2024-11-09 MED ORDER — CEPHALEXIN 500 MG PO CAPS: 500.0000 mg | ORAL_CAPSULE | Freq: Three times a day (TID) | ORAL | 0 refills | Status: AC

## 2024-11-09 NOTE — ED Provider Notes (Signed)
 " [facility] EMERGENCY DEPARTMENT AT [facility] Provider Note   CSN: [id number] Arrival date & time: 11/09/24  1030     Patient presents with: Urinary Urgency   Mary Mendez is a 64 y.o. female.   64 year old female with past medical history significant for breast cancer currently on chemotherapy.  Last session was 8 days ago and neck session will be on 1/23.  She states around 4 AM today she developed some urgency indicating to her she may be developing a urinary tract infection.  She states she called her oncology office and they recommended she come in to have a urinalysis performed to see if she has a urinary tract infection.  She denies any fever, flank pain.  Does endorse vaginal itching.  She is curious if this is a side effect of the chemotherapy.  Denies nausea, vomiting.        Prior to Admission medications  Medication Sig Start Date End Date Taking? Authorizing Provider  cephALEXin  (KEFLEX ) 500 MG capsule Take 1 capsule (500 mg total) by mouth 3 (three) times daily for 7 days. 11/09/24 11/16/24 Yes Kristiane Morsch, PA-C  fluconazole  (DIFLUCAN ) 150 MG tablet Take 1 tablet (150 mg total) by mouth every 3 (three) days as needed for up to 2 doses. Take 1 tablet today.  If your symptoms do not resolve you can repeat this in 72 hours. 11/09/24  Yes Tsuyako Jolley, PA-C  acetaminophen  (TYLENOL ) 500 MG tablet Take 1,000 mg by mouth every 6 (six) hours as needed for mild pain.    [provider]  albuterol  (VENTOLIN  HFA) 108 (90 Base) MCG/ACT inhaler Inhale into the lungs. 08/22/24   [provider]  amLODipine  (NORVASC ) 10 MG tablet Take 10 mg by mouth daily. 08/25/24   [provider]  busPIRone  (BUSPAR ) 10 MG tablet Take 10 mg by mouth 3 (three) times daily as needed (Anxiety). 10/01/22   [provider]  dexamethasone  (DECADRON ) 4 MG tablet Take 2 tabs by mouth 2 times daily starting day before chemo. Then take 2 tabs daily for 2 days starting day  after chemo. Take with food. 08/21/24   Iruku, Praveena, MD  fluticasone -salmeterol (ADVAIR) 250-50 MCG/ACT AEPB Inhale 1 puff into the lungs 2 (two) times daily. 08/22/24   [provider]  guaiFENesin -dextromethorphan  (ROBITUSSIN DM) 100-10 MG/5ML syrup Take 5 mLs by mouth every 4 (four) hours as needed for cough. 09/18/24   Sebastian Toribio GAILS, MD  lidocaine -prilocaine  (EMLA ) cream Apply to affected area once 08/21/24   Iruku, Praveena, MD  loratadine  (CLARITIN ) 10 MG tablet Take 10 mg by mouth daily.    [provider]  montelukast  (SINGULAIR ) 10 MG tablet Take 10 mg by mouth daily. 08/25/24   [provider]  nystatin  (MYCOSTATIN /NYSTOP ) powder Apply 1 Application topically 3 (three) times daily. 09/03/24   Iruku, Praveena, MD  ondansetron  (ZOFRAN ) 8 MG tablet Take 1 tablet (8 mg total) by mouth every 8 (eight) hours as needed for nausea or vomiting. Start on the third day after chemotherapy. 08/21/24   Iruku, Praveena, MD  oxyCODONE  (OXY IR/ROXICODONE ) 5 MG immediate release tablet Take 1 tablet (5 mg total) by mouth every 12 (twelve) hours as needed for severe pain (pain score 7-10). 10/10/24   Iruku, Praveena, MD  prochlorperazine  (COMPAZINE ) 10 MG tablet Take 1 tablet (10 mg total) by mouth every 6 (six) hours as needed for nausea or vomiting. 10/10/24   Iruku, Praveena, MD  rosuvastatin  (CRESTOR ) 10 MG tablet Take 10  mg by mouth daily.    [provider]  tretinoin  (RETIN-A ) 0.1 % cream Apply 1 application  topically at bedtime. 09/25/22   [provider]  valsartan (DIOVAN) 160 MG tablet Take 160 mg by mouth daily.    [provider]  zolpidem  (AMBIEN ) 5 MG tablet Take 1 tablet (5 mg total) by mouth at bedtime as needed for sleep. 09/18/24   Sebastian Toribio GAILS, MD    Allergies: Patient has no known allergies.    Review of Systems  Constitutional:  Negative for chills and fever.  Gastrointestinal:  Negative for nausea and vomiting.   Genitourinary:  Positive for frequency. Negative for difficulty urinating, dysuria and flank pain.  All other systems reviewed and are negative.   Updated Vital Signs BP (!) 153/104 (BP Location: Right Wrist)   Pulse (!) 107   Temp 98.4 F (36.9 C)   Resp 15   SpO2 98%   Physical Exam Vitals and nursing note reviewed.  Constitutional:      General: She is not in acute distress.    Appearance: Normal appearance. She is not ill-appearing.  HENT:     Head: Normocephalic and atraumatic.     Nose: Nose normal.  Eyes:     Conjunctiva/sclera: Conjunctivae normal.  Cardiovascular:     Rate and Rhythm: Regular rhythm. Tachycardia present.  Pulmonary:     Effort: Pulmonary effort is normal. No respiratory distress.  Abdominal:     General: There is no distension.     Palpations: Abdomen is soft.     Tenderness: There is no abdominal tenderness. There is no right CVA tenderness, left CVA tenderness or guarding.  Musculoskeletal:        General: No deformity. Normal range of motion.     Cervical back: Normal range of motion.  Skin:    Findings: No rash.  Neurological:     Mental Status: She is alert.     (all labs ordered are listed, but only abnormal results are displayed) Labs Reviewed  URINALYSIS, ROUTINE W REFLEX MICROSCOPIC - Abnormal; Notable for the following components:      Result Value   Color, Urine BROWN (*)    Hgb urine dipstick TRACE (*)    Bilirubin Urine SMALL (*)    Protein, ur TRACE (*)    Nitrite POSITIVE (*)    Bacteria, UA RARE (*)    Non Squamous Epithelial 0-5 (*)    All other components within normal limits  URINE CULTURE    EKG: None  Radiology: No results found.   Procedures   Medications Ordered in the ED  cephALEXin  (KEFLEX ) capsule 500 mg (has no administration in time range)                                    Medical Decision Making Amount and/or Complexity of Data Reviewed Labs: ordered.  Risk Prescription drug  management.   64 year old female with past medical history significant for breast cancer currently on chemotherapy presents today for concern of potential urinary tract infection.  UA does show signs of urinary tract infection.  Will send urine culture to ensure that the antibiotics are sufficient to cover this.  Will start her on Keflex .  She is tachycardic at 107 but states that this is not unusual for her as she gets anxious when she comes into the hospital.  Chart reviewed and with previous office visits  as well as ED visits typically her heart rates in the mid to upper 90s.  Once she was tachycardic back in 2021 when she arrived for epistaxis.  We discussed additional blood work to further evaluate for sepsis however she declines.  She will closely follow-up with her PCP and oncologist.  Strict return precautions given for the emergency department for any worsening symptoms.  Patient voices understanding and is in agreement with plan.  Discharged in stable condition.   Final diagnoses:  Acute cystitis without hematuria    ED Discharge Orders          Ordered    cephALEXin  (KEFLEX ) 500 MG capsule  3 times daily        11/09/24 1203    fluconazole  (DIFLUCAN ) 150 MG tablet  Every 72 hours PRN        11/09/24 1203               Hildegard Loge, PA-C 11/09/24 1211    Pamella Ozell LABOR, DO 11/17/24 1843  "

## 2024-11-09 NOTE — Discharge Instructions (Signed)
 Your urine did not show signs of a urinary tract infection.  I have sent a urine culture to confirm the bacteria that is causing this to ensure that the antibiotic we started will be sufficient.  If you have any worsening symptoms return to the emergency room.  Your heart rate was slightly elevated today but you states this is not unusual for you specially with coming to the hospital which can cause some anxiety.  Follow-up with your oncologist or primary care doctor for reevaluation to ensure that everything is headed in the right direction.  If things are getting worse or you develop a fever or you develop pain in your flanks this could be a sign of worsening infection and you should come back to the emergency room.

## 2024-11-09 NOTE — ED Notes (Signed)
 DC paperwork given and verbally understood.

## 2024-11-09 NOTE — ED Triage Notes (Signed)
 She c/o urinary urgency with very mild dysuria without fever, x 3 days. She states she underwent chemo a week ago and recalls this has occurred before shortly after having received chemo. She tells me that the last time this occurred she did not require antibiotics. She tells me she is on AZO otc currently.

## 2024-11-11 LAB — URINE CULTURE: Culture: 40000 — AB

## 2024-11-12 ENCOUNTER — Telehealth (HOSPITAL_BASED_OUTPATIENT_CLINIC_OR_DEPARTMENT_OTHER): Payer: Self-pay | Admitting: *Deleted

## 2024-11-12 NOTE — Telephone Encounter (Signed)
 Post ED Visit - Positive Culture Follow-up  Culture report reviewed by antimicrobial stewardship pharmacist: [facility] [x]  Leonor Bash, [place].D. []  Venetia Gully, Pharm.D., BCPS AQ-ID []  Garrel Crews, Pharm.D., BCPS []  Almarie Lunger, [address].D., BCPS []  [place], [address].D., BCPS, AAHIVP []  Rosaline Bihari, Pharm.D., BCPS, AAHIVP []  Vernell Meier, PharmD, BCPS []  Latanya Hint, PharmD, BCPS []  Donald Medley, PharmD, BCPS []  Rocky Bold, PharmD []  Dorothyann Alert, PharmD, BCPS []  Morene Babe, PharmD  [facility] []  Rosaline Edison, PharmD []  Romona Bliss, PharmD []  Dolphus Roller, PharmD []  Veva Seip, Rph []  Vernell Daunt) Leonce, PharmD []  Eva Allis, PharmD []  Rosaline Millet, PharmD []  Iantha Batch, PharmD []  Arvin Gauss, PharmD []  Wanda Hasting, PharmD []  Ronal Rav, PharmD []  Rocky Slade, PharmD []  Bard Jeans, PharmD   Positive urine culture Treated with Cephalexin  and Fluconazole , organism sensitive to the same and no further patient follow-up is required at this time.  Jama Wyman Kipper 11/12/2024, 12:29 PM

## 2024-11-14 ENCOUNTER — Other Ambulatory Visit: Payer: Self-pay

## 2024-11-14 ENCOUNTER — Inpatient Hospital Stay

## 2024-11-14 DIAGNOSIS — Z5111 Encounter for antineoplastic chemotherapy: Secondary | ICD-10-CM | POA: Diagnosis not present

## 2024-11-15 ENCOUNTER — Emergency Department (HOSPITAL_COMMUNITY)
Admission: EM | Admit: 2024-11-15 | Discharge: 2024-11-15 | Disposition: A | Discharge status: Home / Self Care | Attending: Emergency Medicine | Admitting: Emergency Medicine

## 2024-11-15 DIAGNOSIS — Z859 Personal history of malignant neoplasm, unspecified: Secondary | ICD-10-CM | POA: Diagnosis not present

## 2024-11-15 DIAGNOSIS — Z4801 Encounter for change or removal of surgical wound dressing: Secondary | ICD-10-CM | POA: Insufficient documentation

## 2024-11-15 NOTE — ED Provider Notes (Signed)
 " [facility] EMERGENCY DEPARTMENT AT [facility] Provider Note   CSN: [id number] Arrival date & time: 11/15/24  9074     Patient presents with: Dressing Change   Mary Mendez is a 64 y.o. female with past medical history significant for cancer, history of recent sepsis, being treated with PICC line, outpatient antibiotics who presents with need for PICC line dressing exchange.  Biopatch placed over PICC line hardware and not over the PICC line insertion site.  Not appropriate and wants it changed.   HPI     Prior to Admission medications  Medication Sig Start Date End Date Taking? Authorizing Provider  acetaminophen  (TYLENOL ) 500 MG tablet Take 1,000 mg by mouth every 6 (six) hours as needed for mild pain.    [provider]  albuterol  (VENTOLIN  HFA) 108 (90 Base) MCG/ACT inhaler Inhale into the lungs. 08/22/24   [provider]  amLODipine  (NORVASC ) 10 MG tablet Take 10 mg by mouth daily. 08/25/24   [provider]  busPIRone  (BUSPAR ) 10 MG tablet Take 10 mg by mouth 3 (three) times daily as needed (Anxiety). 10/01/22   [provider]  cephALEXin  (KEFLEX ) 500 MG capsule Take 1 capsule (500 mg total) by mouth 3 (three) times daily for 7 days. 11/09/24 11/16/24  Hildegard Loge, PA-C  dexamethasone  (DECADRON ) 4 MG tablet Take 2 tabs by mouth 2 times daily starting day before chemo. Then take 2 tabs daily for 2 days starting day after chemo. Take with food. 08/21/24   Iruku, Praveena, MD  fluconazole  (DIFLUCAN ) 150 MG tablet Take 1 tablet (150 mg total) by mouth every 3 (three) days as needed for up to 2 doses. Take 1 tablet today.  If your symptoms do not resolve you can repeat this in 72 hours. 11/09/24   Hildegard, Amjad, PA-C  fluticasone -salmeterol (ADVAIR) 250-50 MCG/ACT AEPB Inhale 1 puff into the lungs 2 (two) times daily. 08/22/24   [provider]  guaiFENesin -dextromethorphan  (ROBITUSSIN DM) 100-10 MG/5ML syrup Take 5 mLs by mouth every  4 (four) hours as needed for cough. 09/18/24   Sebastian Toribio GAILS, MD  lidocaine -prilocaine  (EMLA ) cream Apply to affected area once 08/21/24   Iruku, Praveena, MD  loratadine  (CLARITIN ) 10 MG tablet Take 10 mg by mouth daily.    [provider]  montelukast  (SINGULAIR ) 10 MG tablet Take 10 mg by mouth daily. 08/25/24   [provider]  nystatin  (MYCOSTATIN /NYSTOP ) powder Apply 1 Application topically 3 (three) times daily. 09/03/24   Iruku, Praveena, MD  ondansetron  (ZOFRAN ) 8 MG tablet Take 1 tablet (8 mg total) by mouth every 8 (eight) hours as needed for nausea or vomiting. Start on the third day after chemotherapy. 08/21/24   Iruku, Praveena, MD  oxyCODONE  (OXY IR/ROXICODONE ) 5 MG immediate release tablet Take 1 tablet (5 mg total) by mouth every 12 (twelve) hours as needed for severe pain (pain score 7-10). 10/10/24   Iruku, Praveena, MD  prochlorperazine  (COMPAZINE ) 10 MG tablet Take 1 tablet (10 mg total) by mouth every 6 (six) hours as needed for nausea or vomiting. 10/10/24   Iruku, Praveena, MD  rosuvastatin  (CRESTOR ) 10 MG tablet Take 10 mg by mouth daily.    [provider]  tretinoin  (RETIN-A ) 0.1 % cream Apply 1 application  topically at bedtime. 09/25/22   [provider]  valsartan (DIOVAN) 160 MG tablet Take 160 mg by mouth daily.    [provider]  zolpidem  (AMBIEN ) 5 MG tablet Take 1 tablet (5 mg  total) by mouth at bedtime as needed for sleep. 09/18/24   Sebastian Toribio GAILS, MD    Allergies: Patient has no known allergies.    Review of Systems  All other systems reviewed and are negative.   Updated Vital Signs BP (!) 155/92   Pulse (!) 102   Temp 98.1 F (36.7 C) (Oral)   Resp 17   SpO2 100%   Physical Exam Vitals and nursing note reviewed.  Constitutional:      General: She is not in acute distress.    Appearance: Normal appearance.  HENT:     Head: Normocephalic and atraumatic.  Eyes:     General:        Right eye:  No discharge.        Left eye: No discharge.  Cardiovascular:     Rate and Rhythm: Normal rate and regular rhythm.  Pulmonary:     Effort: Pulmonary effort is normal. No respiratory distress.  Musculoskeletal:        General: No deformity.  Skin:    General: Skin is warm and dry.     Comments: PICC line dressing appears appropriate other than Biopatch which is placed on top of the PICC line hardware and not on top of the PICC line insertion site.  Neurological:     Mental Status: She is alert and oriented to person, place, and time.  Psychiatric:        Mood and Affect: Mood normal.        Behavior: Behavior normal.     (all labs ordered are listed, but only abnormal results are displayed) Labs Reviewed - No data to display  EKG: None  Radiology: No results found.   Procedures   Medications Ordered in the ED - No data to display                                  Medical Decision Making  Patient with PICC line in place, Biopatch in the wrong location.  Due to the sensitive nature of this line will need to be sterilely exchanged.  My nurse performed a sterile exchange of the PICC line dressing, and replaced the Biopatch in the appropriate position.  Patient discharged in stable condition, no additional workup indicated at this time.  Final diagnoses:  Dressing change or removal, surgical wound    ED Discharge Orders     None          Rosan Sherlean DEL, PA-C 11/15/24 1031    Franklyn Sid SAILOR, MD 11/15/24 1051  "

## 2024-11-15 NOTE — ED Triage Notes (Signed)
 Pt reports picc line placed yesterday but the micro- patch was note placed and she was told to come here. Pt denies any other complaints or medical complaints

## 2024-11-15 NOTE — ED Notes (Signed)
 Sterile dressing change to PICC line due to entrance site of PICC exposed. New antimicrobial patch applied and Tegaderm over site. Sterile technique used through whole process. Pt feels much better with new dressing over site.

## 2024-11-15 NOTE — Discharge Instructions (Addendum)
 Your dressing was changed appropriately today, and you can follow up with your outside physicians, continue taking your antibiotics as prescribed.

## 2024-11-19 ENCOUNTER — Encounter: Payer: Self-pay | Admitting: *Deleted

## 2024-11-20 ENCOUNTER — Inpatient Hospital Stay: Admitting: Hematology and Oncology

## 2024-11-20 ENCOUNTER — Inpatient Hospital Stay

## 2024-11-20 DIAGNOSIS — Z5111 Encounter for antineoplastic chemotherapy: Secondary | ICD-10-CM | POA: Diagnosis not present

## 2024-11-20 DIAGNOSIS — C50212 Malignant neoplasm of upper-inner quadrant of left female breast: Secondary | ICD-10-CM | POA: Diagnosis not present

## 2024-11-20 DIAGNOSIS — Z170 Estrogen receptor positive status [ER+]: Secondary | ICD-10-CM

## 2024-11-20 LAB — CBC WITH DIFFERENTIAL (CANCER CENTER ONLY)
Abs Immature Granulocytes: 0.06 K/uL (ref 0.00–0.07)
Basophils Absolute: 0 K/uL (ref 0.0–0.1)
Basophils Relative: 0 %
Eosinophils Absolute: 0 K/uL (ref 0.0–0.5)
Eosinophils Relative: 0 %
HCT: 32.6 % — ABNORMAL LOW (ref 36.0–46.0)
Hemoglobin: 11.2 g/dL — ABNORMAL LOW (ref 12.0–15.0)
Immature Granulocytes: 1 %
Lymphocytes Relative: 10 %
Lymphs Abs: 0.8 K/uL (ref 0.7–4.0)
MCH: 28.8 pg (ref 26.0–34.0)
MCHC: 34.4 g/dL (ref 30.0–36.0)
MCV: 83.8 fL (ref 80.0–100.0)
Monocytes Absolute: 0.1 K/uL (ref 0.1–1.0)
Monocytes Relative: 1 %
Neutro Abs: 6.8 K/uL (ref 1.7–7.7)
Neutrophils Relative %: 88 %
Platelet Count: 296 K/uL (ref 150–400)
RBC: 3.89 MIL/uL (ref 3.87–5.11)
RDW: 18.8 % — ABNORMAL HIGH (ref 11.5–15.5)
WBC Count: 7.7 K/uL (ref 4.0–10.5)
nRBC: 0 % (ref 0.0–0.2)

## 2024-11-20 LAB — CMP (CANCER CENTER ONLY)
ALT: 23 U/L (ref 0–44)
AST: 21 U/L (ref 15–41)
Albumin: 4.5 g/dL (ref 3.5–5.0)
Alkaline Phosphatase: 97 U/L (ref 38–126)
Anion gap: 14 (ref 5–15)
BUN: 18 mg/dL (ref 8–23)
CO2: 20 mmol/L — ABNORMAL LOW (ref 22–32)
Calcium: 9.4 mg/dL (ref 8.9–10.3)
Chloride: 106 mmol/L (ref 98–111)
Creatinine: 0.7 mg/dL (ref 0.44–1.00)
GFR, Estimated: >60 mL/min
Glucose, Bld: 196 mg/dL — ABNORMAL HIGH (ref 70–99)
Potassium: 4.5 mmol/L (ref 3.5–5.1)
Sodium: 140 mmol/L (ref 135–145)
Total Bilirubin: 0.4 mg/dL (ref 0.0–1.2)
Total Protein: 7.4 g/dL (ref 6.5–8.1)

## 2024-11-20 MED ORDER — DEXAMETHASONE SOD PHOSPHATE PF 10 MG/ML IJ SOLN
10.0000 mg | Freq: Once | INTRAMUSCULAR | Status: AC
  Administered 2024-11-20: 10 mg via INTRAVENOUS
  Filled 2024-11-20: qty 1

## 2024-11-20 MED ORDER — PALONOSETRON HCL INJECTION 0.25 MG/5ML
0.2500 mg | Freq: Once | INTRAVENOUS | Status: AC
  Administered 2024-11-20: 0.25 mg via INTRAVENOUS
  Filled 2024-11-20: qty 5

## 2024-11-20 MED ORDER — FLUCONAZOLE 150 MG PO TABS: 150.0000 mg | ORAL_TABLET | ORAL | 0 refills | Status: AC | PRN

## 2024-11-20 MED ORDER — SODIUM CHLORIDE 0.9 % IV SOLN
600.0000 mg/m2 | Freq: Once | INTRAVENOUS | Status: AC
  Administered 2024-11-20: 1280 mg via INTRAVENOUS
  Filled 2024-11-20: qty 64

## 2024-11-20 MED ORDER — DEXAMETHASONE 4 MG PO TABS: ORAL_TABLET | ORAL | 0 refills | Status: AC

## 2024-11-20 MED ORDER — SODIUM CHLORIDE 0.9 % IV SOLN
75.0000 mg/m2 | Freq: Once | INTRAVENOUS | Status: AC
  Administered 2024-11-20: 160 mg via INTRAVENOUS
  Filled 2024-11-20: qty 16

## 2024-11-20 MED ORDER — SODIUM CHLORIDE 0.9 % IV SOLN: INTRAVENOUS | Status: DC

## 2024-11-20 NOTE — Patient Instructions (Signed)
 [facility] - A DEPT OF [facility]  Discharge Instructions: Thank you for choosing [facility] to provide your oncology and hematology care.   If you have a lab appointment with the Cancer Center, please go directly to the Cancer Center and check in at the registration area.   Wear comfortable clothing and clothing appropriate for easy access to any Portacath or PICC line.   We strive to give you quality time with your provider. You may need to reschedule your appointment if you arrive late (15 or more minutes).  Arriving late affects you and other patients whose appointments are after yours.  Also, if you miss three or more appointments without notifying the office, you may be dismissed from the clinic at the provider's discretion.      For prescription refill requests, have your pharmacy contact our office and allow 72 hours for refills to be completed.    Today you received the following chemotherapy and/or immunotherapy agents: Docetaxel  (Taxotere ) & Cyclophosphamide  (Cytoxan )    To help prevent nausea and vomiting after your treatment, we encourage you to take your nausea medication as directed.  BELOW ARE SYMPTOMS THAT SHOULD BE REPORTED IMMEDIATELY: *FEVER GREATER THAN 100.4 F (38 C) OR HIGHER *CHILLS OR SWEATING *NAUSEA AND VOMITING THAT IS NOT CONTROLLED WITH YOUR NAUSEA MEDICATION *UNUSUAL SHORTNESS OF BREATH *UNUSUAL BRUISING OR BLEEDING *URINARY PROBLEMS (pain or burning when urinating, or frequent urination) *BOWEL PROBLEMS (unusual diarrhea, constipation, pain near the anus) TENDERNESS IN MOUTH AND THROAT WITH OR WITHOUT PRESENCE OF ULCERS (sore throat, sores in mouth, or a toothache) UNUSUAL RASH, SWELLING OR PAIN  UNUSUAL VAGINAL DISCHARGE OR ITCHING   Items with * indicate a potential emergency and should be followed up as soon as possible or go to the Emergency Department if any problems should occur.  Please show the  CHEMOTHERAPY ALERT CARD or IMMUNOTHERAPY ALERT CARD at check-in to the Emergency Department and triage nurse.  Should you have questions after your visit or need to cancel or reschedule your appointment, please contact [facility] DE[facility]  Dept: [phone]  and follow the prompts.  Office hours are 8:00 a.m. to 4:30 p.m. Monday - Friday. Please note that voicemails left after 4:00 p.m. may not be returned until the following business day.  We are closed weekends and major holidays. You have access to a nurse at all times for urgent questions. Please call the main number to the clinic Dept: [phone] and follow the prompts.   For any non-urgent questions, you may also contact your provider using MyChart. We now offer e-Visits for anyone 72 and older to request care online for non-urgent symptoms. For details visit [email or web address].   Also download the MyChart app! Go to the app store, search MyChart, open the app, select [facility], and log in with your MyChart username and password.

## 2024-11-20 NOTE — Progress Notes (Signed)
 [facility] CONSULT NOTE  Patient Care Team: [name], MD as PCP - General (Family Medicine) Tyree Nanetta SAILOR, RN as Oncology Nurse Navigator Gerome, Devere HERO, RN as Oncology Nurse Navigator Vernetta Berg, MD as Consulting Physician (General Surgery) Loretha Ash, MD as Consulting Physician (Hematology and Oncology) Izell Domino, MD as Attending Physician (Radiation Oncology)  CHIEF COMPLAINTS/PURPOSE OF CONSULTATION:  Newly diagnosed breast cancer  HISTORY OF PRESENTING ILLNESS:  Mary Mendez 64 y.o. female is here because of recent diagnosis of left breast IDC  I reviewed her records extensively and collaborated the history with the patient.  SUMMARY OF ONCOLOGIC HISTORY: Oncology History  Malignant neoplasm of upper-inner quadrant of left breast in female, estrogen receptor positive (HCC)  06/03/2024 Pathology Results   Breast, left, needle core biopsy, mass with calcifications 5mm upper inner quadrant (ribbon clip) :      INVASIVE MAMMARY CARCINOMA      TUBULE FORMATION: SCORE 3      NUCLEAR PLEOMORPHISM: SCORE 2      MITOTIC COUNT: SCORE 1      TOTAL SCORE: 6      OVERALL GRADE: 2      LYMPHOVASCULAR INVASION: NOT IDENTIFIED      CANCER LENGTH: 5.5 MM      CALCIFICATIONS: PRESENT      OTHER FINDINGS: NONE      SEE NOTE       2. Breast, left, needle core biopsy, 2.5cm group upper inner quadrant (x clip) :      DUCTAL CARCINOMA IN SITU, INTERMEDIATE GRADE, SOLID TYPE      NECROSIS: PRESENT      CALCIFICATIONS: PRESENT      DCIS LENGTH: 1.0 CM / 10 MM      SEE NOTE  IMMUNOHISTOCHEMICAL AND MORPHOMETRIC ANALYSIS PERFORMED MANUALLY The tumor cells are NEGATIVE for Her2 (0). Estrogen Receptor:  100%, POSITIVE, STRONG STAINING INTENSITY Progesterone Receptor:  20%, POSITIVE, MODERATE STAINING INTENSITY Proliferation Marker Ki67:  60%     06/04/2024 Mammogram   Suspicious findings. The suggested screening mammographic asymmetry is  consistent with a small irregular mass/focal asymmetry with a few associated calcifications projecting in the upper inner left breast measuring 5 mm mammographically. This may correspond to the hypoechoic mass seen on sonography at 10 o'clock, 8 cm the nipple or to the second sonographic abnormality, at 10:30 o'clock, 10 cm from the nipple. This latter finding is smaller and more subtle. Given the uncertainty of which sonographic finding may be the correlate to the mammographic abnormality, stereotactic biopsy of the mammographic finding is recommended.  2.5 cm group of suspicious calcifications in the medial left breast, anterior to the above described small irregular mass/focal asymmetry. Stereotactic core needle biopsy is recommended.   06/09/2024 Initial Diagnosis   Malignant neoplasm of upper-inner quadrant of left breast in female, estrogen receptor positive (HCC)   06/11/2024 Cancer Staging   Staging form: Breast, AJCC 8th Edition - Clinical stage from 06/11/2024: Stage IA (cT1b, cN0, cM0, G2, ER+, PR+, HER2-) - Signed by Loretha Ash, MD on 06/11/2024 Stage prefix: Initial diagnosis Histologic grading system: 3 grade system Laterality: Left Staged by: Pathologist and managing physician Stage used in treatment planning: Yes National guidelines used in treatment planning: Yes Type of national guideline used in treatment planning: NCCN    Genetic Testing   [facility] CancerNext-Expanded Panel+RNA was Negative. 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 Report date is 06/18/2024.   The  CancerNext-Expanded gene panel offered by [employer] and includes sequencing, rearrangement, and RNA analysis for the following 77 genes: AIP, ALK, APC, ATM, AXIN2, BAP1, BARD1, BMPR1A, BRCA1, BRCA2, BRIP1, CDC73, CDH1, CDK4, CDKN1B, CDKN2A, CEBPA, CHEK2, CTNNA1, DDX41, DICER1, ETV6, FH, FLCN, GATA2, LZTR1, MAX, MBD4, MEN1, MET, MLH1, MSH2, MSH3, MSH6, MUTYH,  NF1, NF2, NTHL1, PALB2, PHOX2B, PMS2, POT1, PRKAR1A, PTCH1, PTEN, RAD51C, RAD51D, RB1, RET, RPS20, RUNX1, SDHA, SDHAF2, SDHB, SDHC, SDHD, SMAD4, SMARCA4, SMARCB1, SMARCE1, STK11, SUFU, TMEM127, TP53, TSC1, TSC2, VHL, and WT1 (sequencing and deletion/duplication); EGFR, HOXB13, KIT, MITF, PDGFRA, POLD1, and POLE (sequencing only); EPCAM and GREM1 (deletion/duplication only).     09/04/2024 -  Chemotherapy   Patient is on Treatment Plan : BREAST TC q21d       Discussed the use of AI scribe software for clinical note transcription with the patient, who gave verbal consent to proceed.  History of Present Illness  Mary Mendez is a 64 year old female with ER-positive left breast cancer who presents for follow-up and transition planning to radiation therapy.  She is undergoing chemotherapy for ER-positive left breast cancer. She reports mild breast swelling without new palpable masses and denies oral mucositis. She continues to use a cold cap and retains partial scalp hair.  She recently received ciprofloxacin or levofloxacin for a urinary tract infection, which has resolved. Following antibiotic therapy, she developed severe pruritus localized to the external genitalia, associated with hair loss in the area, but denies internal vaginal pruritus. She has used Vagisil without relief and has not yet tried clotrimazole. She denies symptoms suggestive of bacterial vaginosis.  She reports pain under her arm and significant shoulder pain, which she attributes to her sleeping position and considers muscular. Mild swelling is present, but no new breast lumps are noted. No oral mucosal changes are reported.  Recent imaging identified an 8 mm nodule in the right lower lobe; she recalls a previously stable nodule in the right upper lobe that had been followed for several years.   MEDICAL HISTORY:  Past Medical History:  Diagnosis Date   Anxiety    Asthma    Bronchitis    Cancer (HCC) 06/2024   Left  breast IDC   COVID-19 08/2021   Essential hypertension 09/11/2024   Hyperlipidemia    Hypertension    Mild intermittent asthma 09/11/2024   Pneumonia     SURGICAL HISTORY: Past Surgical History:  Procedure Laterality Date   ABDOMINAL HYSTERECTOMY  2010   and left ovaries;   BREAST BIOPSY     BREAST BIOPSY Left 06/04/2024   MM LT BREAST BX W LOC DEV 1ST LESION IMAGE BX SPEC STEREO GUIDE 06/04/2024 [facility]   BREAST BIOPSY Left 06/04/2024   MM LT BREAST BX W LOC DEV EA AD LESION IMG BX SPEC STEREO GUIDE 06/04/2024 [facility]   BREAST BIOPSY  07/15/2024   MM LT RADIOACTIVE SEED LOC MAMMO GUIDE 07/15/2024 [facility]   BREAST BIOPSY  07/15/2024   MM LT RADIOACTIVE SEED EA ADD LESION LOC MAMMO GUIDE 07/15/2024 [facility]   BREAST BIOPSY  07/15/2024   MM LT RADIOACTIVE SEED EA ADD LESION LOC MAMMO GUIDE 07/15/2024 [facility]   BREAST BIOPSY  07/15/2024   MM LT RADIOACTIVE SEED EA ADD LESION LOC MAMMO GUIDE 07/15/2024 [facility]   BREAST EXCISIONAL BIOPSY Left    BREAST LUMPECTOMY WITH RADIOACTIVE SEED AND SENTINEL LYMPH NODE BIOPSY Left 07/17/2024   Procedure: BREAST LUMPECTOMY WITH RADIOACTIVE SEED AND SENTINEL LYMPH NODE BIOPSY;  Surgeon:  Vernetta Berg, MD;  Location: [facility] OR;  Service: General;  Laterality: Left;  RADIOACTIVE SEED GUIDED LEFT BREAST BRACKETED LUMPECTOMY AND SENTINEL NODE BIOPSY   BREAST REDUCTION SURGERY Bilateral 07/25/2024   Procedure: MAMMOPLASTY, REDUCTION;  Surgeon: Arelia Filippo, MD;  Location: [facility];  Service: Plastics;  Laterality: Bilateral;   BREAST SURGERY     CHOLECYSTECTOMY N/A 02/18/2022   Procedure: LAPAROSCOPIC CHOLECYSTECTOMY;  Surgeon: Signe Mitzie LABOR, MD;  Location: [facility] OR;  Service: General;  Laterality: N/A;   EXCISION, MASS, UPPER EXTREMITY Left 07/17/2024   Procedure: EXCISION, MASS, UPPER EXTREMITY;  Surgeon: Vernetta Berg, MD;  Location: [facility] OR;  Service: General;  Laterality:  Left;  EXCISION LEFT SHOULDER MASS   HYSTEROTOMY     PORT-A-CATH REMOVAL N/A 09/14/2024   Procedure: REMOVAL PORT-A-CATH;  Surgeon: Belinda Cough, MD;  Location: [facility]S;  Service: General;  Laterality: N/A;   PORTACATH PLACEMENT N/A 08/18/2024   Procedure: INSERTION, TUNNELED CENTRAL VENOUS DEVICE, WITH PORT;  Surgeon: Vernetta Berg, MD;  Location: [facility];  Service: General;  Laterality: N/A;  PORT PLACEMENT WITH ULTRASOUND GUIDANCE   TRANSESOPHAGEAL ECHOCARDIOGRAM (CATH LAB) N/A 09/16/2024   Procedure: TRANSESOPHAGEAL ECHOCARDIOGRAM;  Surgeon: Shlomo Wilbert SAUNDERS, MD;  Location: [facility];  Service: Cardiovascular;  Laterality: N/A;    SOCIAL HISTORY: Social History   Socioeconomic History   Marital status: Married    Spouse name: Not on file   Number of children: 2   Years of education: Not on file   Highest education level: Not on file  Occupational History   Occupation: Self Employed  Tobacco Use   Smoking status: Never    Passive exposure: Never   Smokeless tobacco: Never  Vaping Use   Vaping status: Never Used  Substance and Sexual Activity   Alcohol use: Yes    Comment: social   Drug use: Never   Sexual activity: Not Currently    Birth control/protection: Surgical    Comment: hyst  Other Topics Concern   Not on file  Social History Narrative   Not on file   Social Drivers of Health   Tobacco Use: Low Risk (10/21/2024)   Patient History    Smoking Tobacco Use: Never    Smokeless Tobacco Use: Never    Passive Exposure: Never  Financial Resource Strain: Not on file  Food Insecurity: No Food Insecurity (09/11/2024)   Epic    Worried About Programme Researcher, Broadcasting/film/video in the Last Year: Never true    Ran Out of Food in the Last Year: Never true  Transportation Needs: No Transportation Needs (09/11/2024)   Epic    Lack of Transportation (Medical): No    Lack of Transportation (Non-Medical): No  Physical Activity: Not on file  Stress: Not on  file  Social Connections: Unknown (09/11/2024)   Social Connection and Isolation Panel    Frequency of Communication with Friends and Family: More than three times a week    Frequency of Social Gatherings with Friends and Family: More than three times a week    Attends Religious Services: Patient declined    Database Administrator or Organizations: Patient declined    Attends Banker Meetings: Patient declined    Marital Status: Married  Catering Manager Violence: Not At Risk (09/11/2024)   Epic    Fear of Current or Ex-Partner: No    Emotionally Abused: No    Physically Abused: No    Sexually Abused: No  Depression (  PHQ2-9): Low Risk (06/11/2024)   Depression (PHQ2-9)    PHQ-2 Score: 1  Alcohol Screen: Not on file  Housing: Low Risk (09/11/2024)   Epic    Unable to Pay for Housing in the Last Year: No    Number of Times Moved in the Last Year: 0    Homeless in the Last Year: No  Utilities: Not At Risk (09/11/2024)   Epic    Threatened with loss of utilities: No  Health Literacy: Not on file    FAMILY HISTORY: Family History  Problem Relation Age of Onset   Breast cancer Mother 24   Emphysema Father    Stroke Father    Stroke Sister    Asthma Sister    Leukemia Maternal Aunt 85 - 79   Asthma Paternal Uncle    Colon cancer Neg Hx    Colon polyps Neg Hx    Esophageal cancer Neg Hx    Rectal cancer Neg Hx    Stomach cancer Neg Hx     ALLERGIES:  has no known allergies.  MEDICATIONS:  Current Outpatient Medications  Medication Sig Dispense Refill   acetaminophen  (TYLENOL ) 500 MG tablet Take 1,000 mg by mouth every 6 (six) hours as needed for mild pain.     albuterol  (VENTOLIN  HFA) 108 (90 Base) MCG/ACT inhaler Inhale into the lungs.     amLODipine  (NORVASC ) 10 MG tablet Take 10 mg by mouth daily.     busPIRone  (BUSPAR ) 10 MG tablet Take 10 mg by mouth 3 (three) times daily as needed (Anxiety).     dexamethasone  (DECADRON ) 4 MG tablet Take 2 tabs by  mouth 2 times daily starting day before chemo. Then take 2 tabs daily for 2 days starting day after chemo. Take with food. 30 tablet 1   fluconazole  (DIFLUCAN ) 150 MG tablet Take 1 tablet (150 mg total) by mouth every 3 (three) days as needed for up to 2 doses. Take 1 tablet today.  If your symptoms do not resolve you can repeat this in 72 hours. 1 tablet 0   fluticasone -salmeterol (ADVAIR) 250-50 MCG/ACT AEPB Inhale 1 puff into the lungs 2 (two) times daily.     guaiFENesin -dextromethorphan  (ROBITUSSIN DM) 100-10 MG/5ML syrup Take 5 mLs by mouth every 4 (four) hours as needed for cough.     lidocaine -prilocaine  (EMLA ) cream Apply to affected area once 30 g 3   loratadine  (CLARITIN ) 10 MG tablet Take 10 mg by mouth daily.     montelukast  (SINGULAIR ) 10 MG tablet Take 10 mg by mouth daily.     nystatin  (MYCOSTATIN /NYSTOP ) powder Apply 1 Application topically 3 (three) times daily. 15 g 0   ondansetron  (ZOFRAN ) 8 MG tablet Take 1 tablet (8 mg total) by mouth every 8 (eight) hours as needed for nausea or vomiting. Start on the third day after chemotherapy. 30 tablet 1   oxyCODONE  (OXY IR/ROXICODONE ) 5 MG immediate release tablet Take 1 tablet (5 mg total) by mouth every 12 (twelve) hours as needed for severe pain (pain score 7-10). 20 tablet 0   prochlorperazine  (COMPAZINE ) 10 MG tablet Take 1 tablet (10 mg total) by mouth every 6 (six) hours as needed for nausea or vomiting. 30 tablet 1   rosuvastatin  (CRESTOR ) 10 MG tablet Take 10 mg by mouth daily.     tretinoin  (RETIN-A ) 0.1 % cream Apply 1 application  topically at bedtime.     valsartan (DIOVAN) 160 MG tablet Take 160 mg by mouth daily.     zolpidem  (  AMBIEN ) 5 MG tablet Take 1 tablet (5 mg total) by mouth at bedtime as needed for sleep. 10 tablet 0   No current facility-administered medications for this visit.   ECOG PERFORMANCE STATUS: 0 - Asymptomatic  Vitals:   11/20/24 1051  BP: (!) 153/78  Pulse: 96  Resp: 18  Temp: 98 F (36.7 C)   SpO2: 98%      Filed Weights   11/20/24 1051  Weight: 210 lb 4.8 oz (95.4 kg)   GENERAL:alert, no distress and comfortable No cervical or axillary adenopathy CTA bilaterally RRR Abdomen: Soft, non tender, non distended NO LE edema.   LABORATORY DATA:  I have reviewed the data as listed Lab Results  Component Value Date   WBC 7.7 11/20/2024   HGB 11.2 (L) 11/20/2024   HCT 32.6 (L) 11/20/2024   MCV 83.8 11/20/2024   PLT 296 11/20/2024   Lab Results  Component Value Date   NA 140 11/20/2024   K 4.5 11/20/2024   CL 106 11/20/2024   CO2 20 (L) 11/20/2024    RADIOGRAPHIC STUDIES: I have personally reviewed the radiological reports and agreed with the findings in the report.  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 Oncotype DX score 46, high recurrence risk. Chemotherapy recommended to reduce metastasis risk. Recommend TC regimen: two chemotherapy drugs every 21 days for four cycles, followed by radiation and anti-estrogen therapy for five years. She had bacteremia, blood stream infection, port removed, c2 delayed. She is now here before planned C4 of chemotherapy Assessment & Plan Estrogen receptor positive invasive ductal carcinoma of the upper-inner quadrant of the left breast Completing final chemotherapy cycle today  - Administered final cycle of chemotherapy. - Scheduled radiation oncology appointment in February to initiate radiation therapy approximately one month post-chemotherapy. - Scheduled follow-up with plastic surgery. - Schedule PICC line removal for next Friday after chemotherapy completion.  Vulvovaginal candidiasis Significant vulvar pruritus post-antibiotic therapy, consistent with vulvovaginal candidiasis. No internal vaginal symptoms.  - Recommended over-the-counter clotrimazole cream and clotrimazole wipes  for topical therapy. - Prescribed oral fluconazole , one dose now and a second dose in three days if symptoms persist. - Provided education regarding antibiotic-associated candidiasis and the importance of restoring normal flora.  Right lower lobe pulmonary nodule New 8 mm right lower lobe pulmonary nodule identified, distinct from stable right upper lobe nodule.  - Ordered CT chest without contrast in three months to monitor the right lower lobe pulmonary nodule. - Will review prior imaging to confirm the new location and assess for interval changes.  Time spent: 30 min including H and P, review of records, counseling and coordination of care. All questions were answered. The patient knows to call the clinic with any problems, questions or concerns.    Amber Stalls, MD 11/20/24

## 2024-11-21 ENCOUNTER — Inpatient Hospital Stay

## 2024-11-21 ENCOUNTER — Other Ambulatory Visit: Payer: Self-pay

## 2024-11-21 DIAGNOSIS — Z5111 Encounter for antineoplastic chemotherapy: Secondary | ICD-10-CM | POA: Diagnosis not present

## 2024-11-21 DIAGNOSIS — C50212 Malignant neoplasm of upper-inner quadrant of left female breast: Secondary | ICD-10-CM

## 2024-11-21 MED ORDER — PEGFILGRASTIM-JMDB 6 MG/0.6ML ~~LOC~~ SOSY
6.0000 mg | PREFILLED_SYRINGE | Freq: Once | SUBCUTANEOUS | Status: AC
  Administered 2024-11-21: 6 mg via SUBCUTANEOUS
  Filled 2024-11-21: qty 0.6

## 2024-11-22 ENCOUNTER — Inpatient Hospital Stay

## 2024-11-28 ENCOUNTER — Other Ambulatory Visit: Payer: Self-pay | Admitting: Adult Health

## 2024-11-28 ENCOUNTER — Inpatient Hospital Stay

## 2024-11-28 DIAGNOSIS — C50212 Malignant neoplasm of upper-inner quadrant of left female breast: Secondary | ICD-10-CM

## 2024-11-28 NOTE — Patient Instructions (Addendum)
 Removing a PICC Line (PICC Removal) in Adults: What to Know After After having a soft tube called a peripherally inserted central catheter (PICC) taken out, it's common to have some tenderness or soreness at the site where the PICC was removed. You may also have: Redness. Swelling. A scab. Follow these instructions at home: For the first 24 hours after the PICC is removed: Keep the bandage over the site clean and dry. Do not remove the bandage until told. Do not lift or do things that take a lot of effort until your health care provider says it's OK. Avoid: Lifting weights. Doing yard work. Doing things with repeated arm movements. Watch for any signs of an air bubble in your vein (air embolism). This is rare but dangerous. Signs may include: Trouble breathing. Making a high-pitched whistling sound when you breathe out (wheezing). Chest pain. A fast heart rate. If you have signs of an air embolism: Call 911 right away. Lie down on your left side. After 24 hours have passed:  Remove your bandage as told. Wash your hands with soap and water for at least 20 seconds before and after you remove the bandage. If you can't use soap and water, use hand sanitizer. A small scab may form over the site. Do not pick at the scab. Do not take baths, swim, or use a hot tub until you're told it's OK. Ask if you can shower. The site can be gently washed with soap and water and patted dry. Check the area around your site every day for signs of infection. Check for: Redness, swelling, or pain. Fluid or blood. Warmth. Pus or a bad smell. A red streak spreading away from your site. Ask what things are safe for you to do at home. Ask when you can go back to work or school. General instructions Take your medicines only as told. Contact a health care provider if: You have a fever or chills. You have swelling at the site or swollen glands under your arm. You have soreness, redness, or swelling in your  arm that gets worse. You have any signs of infection. Get help right away if: You have numbness or tingling in your fingers, hand, or arm. Your arm looks blue and feels cold. You have signs of an air embolism. These symptoms may be an emergency. Call 911 right away. Do not wait to see if the symptoms will go away. Do not drive yourself to the hospital. This information is not intended to replace advice given to you by your health care provider. Make sure you discuss any questions you have with your health care provider. Document Revised: 07/23/2024 Document Reviewed: 07/23/2024 Elsevier Patient Education  2025 [employer].

## 2024-11-28 NOTE — Progress Notes (Signed)
 PICC Removal Note: Mary Mendez presented to infusion room today for PICC line removal per MD orders. At 1242 this RN removed Pt's PICC line after site was sterile prepped per protocol. PICC catheter tip visualized and intact. This RN noted catheter length to be 39 cm at time of removal. Pressure applied to site for 5 minutes post removal. Occlusive pressure dressing applied to site. No redness, ecchymosis, edema, swelling, or drainage noted at site. This RN educated Pt and family on post PICC discharge care, including strict ED precautions. Pt and family verbalized understanding.  Pt observed for 30 minutes post removal with site lower than heart.  At time of d/c VSS and Pt ambulatory to lobby with family.

## 2024-12-19 ENCOUNTER — Ambulatory Visit (HOSPITAL_COMMUNITY)

## 2024-12-22 ENCOUNTER — Ambulatory Visit

## 2024-12-22 ENCOUNTER — Ambulatory Visit: Admitting: Radiation Oncology

## 2024-12-23 ENCOUNTER — Ambulatory Visit: Admitting: Radiation Oncology

## 2025-01-22 ENCOUNTER — Inpatient Hospital Stay: Attending: Hematology and Oncology | Admitting: Hematology and Oncology
